# Patient Record
Sex: Male | Born: 1940 | Race: White | Hispanic: No | Marital: Married | Smoking: Former smoker
Health system: Southern US, Community
[De-identification: ages and names within clinical notes are randomized; demographics above are authoritative.]

## PROBLEM LIST (undated history)

## (undated) DIAGNOSIS — C801 Malignant (primary) neoplasm, unspecified: Secondary | ICD-10-CM

## (undated) DIAGNOSIS — R269 Unspecified abnormalities of gait and mobility: Secondary | ICD-10-CM

## (undated) DIAGNOSIS — I10 Essential (primary) hypertension: Secondary | ICD-10-CM

## (undated) DIAGNOSIS — I69321 Dysphasia following cerebral infarction: Secondary | ICD-10-CM

## (undated) DIAGNOSIS — M810 Age-related osteoporosis without current pathological fracture: Secondary | ICD-10-CM

## (undated) DIAGNOSIS — B351 Tinea unguium: Secondary | ICD-10-CM

## (undated) DIAGNOSIS — L570 Actinic keratosis: Secondary | ICD-10-CM

## (undated) DIAGNOSIS — I6529 Occlusion and stenosis of unspecified carotid artery: Secondary | ICD-10-CM

## (undated) DIAGNOSIS — Z87828 Personal history of other (healed) physical injury and trauma: Secondary | ICD-10-CM

## (undated) DIAGNOSIS — I739 Peripheral vascular disease, unspecified: Secondary | ICD-10-CM

## (undated) DIAGNOSIS — G40909 Epilepsy, unspecified, not intractable, without status epilepticus: Secondary | ICD-10-CM

## (undated) DIAGNOSIS — D649 Anemia, unspecified: Secondary | ICD-10-CM

## (undated) HISTORY — DX: Anemia, unspecified: D64.9

## (undated) HISTORY — DX: Essential (primary) hypertension: I10

## (undated) HISTORY — DX: Unspecified abnormalities of gait and mobility: R26.9

## (undated) HISTORY — DX: Malignant (primary) neoplasm, unspecified: C80.1

## (undated) HISTORY — DX: Occlusion and stenosis of unspecified carotid artery: I65.29

## (undated) HISTORY — DX: Personal history of other (healed) physical injury and trauma: Z87.828

## (undated) HISTORY — DX: Actinic keratosis: L57.0

## (undated) HISTORY — DX: Age-related osteoporosis without current pathological fracture: M81.0

## (undated) HISTORY — DX: Peripheral vascular disease, unspecified: I73.9

## (undated) HISTORY — DX: Tinea unguium: B35.1

## (undated) HISTORY — DX: Dysphasia following cerebral infarction: I69.321

## (undated) HISTORY — DX: Epilepsy, unspecified, not intractable, without status epilepticus: G40.909

## (undated) HISTORY — PX: SUBDURAL HEMATOMA EVACUATION VIA CRANIOTOMY: SUR319

---

## 1997-12-25 ENCOUNTER — Emergency Department (HOSPITAL_COMMUNITY): Admission: EM | Admit: 1997-12-25 | Discharge: 1997-12-25 | Payer: Self-pay | Admitting: Emergency Medicine

## 1998-01-02 ENCOUNTER — Emergency Department (HOSPITAL_COMMUNITY): Admission: EM | Admit: 1998-01-02 | Discharge: 1998-01-02 | Payer: Self-pay | Admitting: Emergency Medicine

## 1998-01-05 ENCOUNTER — Ambulatory Visit (HOSPITAL_COMMUNITY): Admission: RE | Admit: 1998-01-05 | Discharge: 1998-01-05 | Payer: Self-pay | Admitting: Neurosurgery

## 1998-01-20 ENCOUNTER — Ambulatory Visit (HOSPITAL_COMMUNITY): Admission: RE | Admit: 1998-01-20 | Discharge: 1998-01-20 | Payer: Self-pay | Admitting: Neurosurgery

## 2002-08-19 ENCOUNTER — Encounter (HOSPITAL_BASED_OUTPATIENT_CLINIC_OR_DEPARTMENT_OTHER): Payer: Self-pay | Admitting: General Surgery

## 2002-08-20 ENCOUNTER — Encounter (INDEPENDENT_AMBULATORY_CARE_PROVIDER_SITE_OTHER): Payer: Self-pay | Admitting: *Deleted

## 2002-08-20 ENCOUNTER — Ambulatory Visit (HOSPITAL_COMMUNITY): Admission: RE | Admit: 2002-08-20 | Discharge: 2002-08-20 | Payer: Self-pay | Admitting: General Surgery

## 2003-05-03 ENCOUNTER — Inpatient Hospital Stay (HOSPITAL_COMMUNITY): Admission: EM | Admit: 2003-05-03 | Discharge: 2003-05-27 | Payer: Self-pay | Admitting: Emergency Medicine

## 2003-05-03 ENCOUNTER — Encounter: Payer: Self-pay | Admitting: Emergency Medicine

## 2003-05-05 ENCOUNTER — Encounter (INDEPENDENT_AMBULATORY_CARE_PROVIDER_SITE_OTHER): Payer: Self-pay | Admitting: *Deleted

## 2003-05-05 ENCOUNTER — Emergency Department (HOSPITAL_COMMUNITY): Admission: EM | Admit: 2003-05-05 | Discharge: 2003-05-05 | Payer: Self-pay | Admitting: Emergency Medicine

## 2003-06-02 ENCOUNTER — Ambulatory Visit (HOSPITAL_COMMUNITY): Admission: RE | Admit: 2003-06-02 | Discharge: 2003-06-02 | Payer: Self-pay | Admitting: Internal Medicine

## 2003-06-04 ENCOUNTER — Ambulatory Visit (HOSPITAL_COMMUNITY): Admission: RE | Admit: 2003-06-04 | Discharge: 2003-06-04 | Payer: Self-pay | Admitting: Internal Medicine

## 2003-06-29 ENCOUNTER — Ambulatory Visit (HOSPITAL_COMMUNITY): Admission: RE | Admit: 2003-06-29 | Discharge: 2003-06-29 | Payer: Self-pay | Admitting: Internal Medicine

## 2003-07-19 ENCOUNTER — Encounter: Admission: RE | Admit: 2003-07-19 | Discharge: 2003-07-19 | Payer: Self-pay | Admitting: Neurosurgery

## 2005-11-16 ENCOUNTER — Encounter: Admission: RE | Admit: 2005-11-16 | Discharge: 2005-11-16 | Payer: Self-pay | Admitting: Internal Medicine

## 2007-11-17 ENCOUNTER — Observation Stay (HOSPITAL_COMMUNITY): Admission: EM | Admit: 2007-11-17 | Discharge: 2007-11-18 | Payer: Self-pay | Admitting: Emergency Medicine

## 2009-10-11 ENCOUNTER — Ambulatory Visit: Payer: Self-pay | Admitting: Vascular Surgery

## 2009-10-13 ENCOUNTER — Inpatient Hospital Stay (HOSPITAL_COMMUNITY): Admission: RE | Admit: 2009-10-13 | Discharge: 2009-10-14 | Payer: Self-pay | Admitting: Vascular Surgery

## 2009-10-13 ENCOUNTER — Ambulatory Visit: Payer: Self-pay | Admitting: Vascular Surgery

## 2009-10-13 ENCOUNTER — Encounter: Payer: Self-pay | Admitting: Vascular Surgery

## 2009-10-13 HISTORY — PX: CAROTID ENDARTERECTOMY: SUR193

## 2009-11-01 ENCOUNTER — Ambulatory Visit: Payer: Self-pay | Admitting: Vascular Surgery

## 2010-03-29 ENCOUNTER — Encounter: Admission: RE | Admit: 2010-03-29 | Discharge: 2010-03-29 | Payer: Self-pay | Admitting: Endocrinology

## 2010-05-26 ENCOUNTER — Ambulatory Visit (HOSPITAL_COMMUNITY): Admission: RE | Admit: 2010-05-26 | Discharge: 2010-05-26 | Payer: Self-pay | Admitting: Gastroenterology

## 2010-06-06 ENCOUNTER — Ambulatory Visit: Payer: Self-pay | Admitting: Vascular Surgery

## 2010-09-27 LAB — BASIC METABOLIC PANEL
CO2: 27 mEq/L (ref 19–32)
Calcium: 8 mg/dL — ABNORMAL LOW (ref 8.4–10.5)
Chloride: 105 mEq/L (ref 96–112)
Glucose, Bld: 94 mg/dL (ref 70–99)
Sodium: 137 mEq/L (ref 135–145)

## 2010-09-27 LAB — URINALYSIS, ROUTINE W REFLEX MICROSCOPIC
Glucose, UA: NEGATIVE mg/dL
Ketones, ur: NEGATIVE mg/dL
Nitrite: NEGATIVE
pH: 7.5 (ref 5.0–8.0)

## 2010-09-27 LAB — CBC
HCT: 40.9 % (ref 39.0–52.0)
Hemoglobin: 12.1 g/dL — ABNORMAL LOW (ref 13.0–17.0)
Hemoglobin: 13.9 g/dL (ref 13.0–17.0)
MCHC: 33.2 g/dL (ref 30.0–36.0)
MCHC: 34 g/dL (ref 30.0–36.0)
MCV: 92.1 fL (ref 78.0–100.0)
MCV: 92.7 fL (ref 78.0–100.0)
RDW: 14 % (ref 11.5–15.5)
RDW: 14.2 % (ref 11.5–15.5)

## 2010-09-27 LAB — COMPREHENSIVE METABOLIC PANEL
BUN: 9 mg/dL (ref 6–23)
Calcium: 9.2 mg/dL (ref 8.4–10.5)
Creatinine, Ser: 0.82 mg/dL (ref 0.4–1.5)
Glucose, Bld: 108 mg/dL — ABNORMAL HIGH (ref 70–99)
Total Protein: 6.8 g/dL (ref 6.0–8.3)

## 2010-09-27 LAB — PROTIME-INR
INR: 1.16 (ref 0.00–1.49)
Prothrombin Time: 14.7 seconds (ref 11.6–15.2)

## 2010-09-27 LAB — CROSSMATCH
ABO/RH(D): A NEG
Antibody Screen: NEGATIVE

## 2010-11-21 NOTE — Assessment & Plan Note (Signed)
OFFICE VISIT   Richard, Mitchell  DOB:  03/31/1941                                       06/06/2010  ZOXWR#:[id number]   The patient is a 70 year old male patient who has previously undergone  left carotid endarterectomy by me 10/13/2009 for severe left internal  carotid stenosis which is asymptomatic.  He had mild disease in the  contralateral right side.  He has done well from a neurologic standpoint  since that time with no history of TIAs, amaurosis fugax, diplopia,  blurred vision or syncope.  He does have a long history of some  dysphagia which has continued to be somewhat of a problem and is being  evaluated at the current time by Dr. Kinnie Scales for this.   CHRONIC MEDICAL PROBLEMS:  1. Hypertension.  2. Seizure disorder.  3. Osteoporosis.  4. Negative for coronary artery disease and diabetes, COPD or stroke.   SOCIAL HISTORY:  He is married, has one child and is retired.  Does not  use tobacco or alcohol.  Has not smoked for 20 years.   FAMILY HISTORY:  Unremarkable.   REVIEW OF SYSTEMS:  Denies any chest pain, dyspnea on exertion, does  have difficulty swallowing as noted.  No hoarseness.  No hemoptysis or  productive cough.  All other systems are negative.  Seizure disorder is  well-controlled at the present time.  All other systems negative.   PHYSICAL EXAMINATION:  Vital signs:  Blood pressure 142/90, heart rate  64, respirations 14.  General:  He is a well-developed, well-nourished  male who is in no apparent distress, alert and oriented x3.  HEENT:  Normal for age.  EOMs intact.  Lungs:  Clear to auscultation.  No  rhonchi or wheezing.  Cardiovascular:  Regular rhythm, no murmurs.  Carotid pulses 3+, no bruits.  Abdomen:  Soft, nontender with no masses.  He has 3+ femoral and popliteal pulses bilaterally.  Both feet are well-  perfused.  Neurological:  Normal.   Today I ordered a carotid duplex exam which I reviewed and interpreted.  He has  no evidence of significant flow reduction on the left side where  the carotid endarterectomy was performed.  The right side has about a  40%-50% stenosis which is unchanged from previous studies.   I reassured him regarding these findings and will check him again in 6  months in the vascular lab unless he develops any symptoms in the  interim.     Quita Skye Hart Rochester, M.D.  Electronically Signed   JDL/MEDQ  D:  06/06/2010  T:  06/06/2010  Job:  [id number]

## 2010-11-21 NOTE — Assessment & Plan Note (Signed)
OFFICE VISIT   SHAMAR, ENGELMANN  DOB:  [birth year]                                       11/01/2009  ZOXWR#:[id number]   The patient returns today 3 weeks post left carotid endarterectomy for  severe left internal carotid stenosis which is asymptomatic.  He has  done very well with no dysphagia or hoarseness and no neurologic  sequelae.  He does not take aspirin on a daily basis.  He has a remote  history of ulcer.   PHYSICAL EXAM:  Today his blood pressure is 147/87, heart rate is 85,  temperature 97.8.  The left neck incision has healed nicely.  He has a  3+ left carotid pulse.  No audible bruit.  Neurologic exam is normal.   He is progressing nicely.  I have encouraged him to increase his  activity as tolerated.  He will return in 6 months for followup carotid  duplex exam and to see me at that time unless he develops any neurologic  symptoms in the interim.  He has very minimal disease in his  contralateral right carotid.     Quita Skye Hart [place], M.D.  Electronically Signed   JDL/MEDQ  D:  11/01/2009  T:  11/02/2009  Job:  [id number]

## 2010-11-21 NOTE — H&P (Signed)
HISTORY AND PHYSICAL EXAMINATION   October 11, 2009   Re:  Richard Mitchell, Richard Mitchell                DOB:  1941/03/25   CHIEF COMPLAINT:  Severe left internal carotid stenosis - asymptomatic.   HISTORY OF PRESENT ILLNESS:  The patient is a 70 year old male patient  referred by Dr. Clelia Croft for severe carotid occlusive disease.  Dr. Clelia Croft was  performing a physical exam and discovered a left carotid bruit and a  subsequent duplex scan revealed 90% left internal carotid stenosis with  mild flow reduction on the right side.  The patient denies any  hemiparesis, aphasia, amaurosis fugax, diplopia, blurred vision or  syncope.  He does have a remote history of seizure disorder.  He was  referred for his severe carotid disease.   CHRONIC MEDICAL PROBLEMS:  1. Hypertension.  2. Osteoporosis.  3. Seizure disorder.  4. Negative for diabetes, coronary artery disease, COPD or stroke.   FAMILY HISTORY:  Positive for pacemaker in his mother, coronary artery  disease in a sister.  Negative for diabetes and stroke.   SOCIAL HISTORY:  He is married, has one child, is retired.  He quit  smoking about 20 years and does not use alcohol.   REVIEW OF SYSTEMS:  Denies any chest pain, dyspnea on exertion, PND,  orthopnea.  No bronchitis, hemoptysis, wheezing.  Denies any GI or GU  symptoms.  No claudication.  No DVT, phlebitis.  Does have a history of  seizure disorder controlled on Tegretol.  Has a remote history of a  bleed in the head from trauma 13 years ago requiring evacuation of what  sounds like subdural hematoma, no current symptoms.  All other systems  are negative in the review of systems.   PHYSICAL EXAMINATION:  Vital signs:  Blood pressure 122/79, heart rate  78, respirations 14.  General:  He is a well-developed, well-nourished  male who is in no apparent distress, alert and oriented x3.  HEENT:  He  has poor oral dentition, otherwise normal.  EOMs intact.  Lungs:  Clear  to  auscultation.  No wheezing.  Cardiovascular:  Regular rate and  rhythm.  No murmurs.  Carotid bruit on the left is heard.  3+ pulses are  present.  Lower extremity pulses are 2+ at the femoral and dorsalis  pedis level.  No edema.  Abdomen: Soft, nontender with no masses.  Musculoskeletal:  Free of major deformities.  Neurologic:  Normal.  Skin:  Free of rashes.   Today I reviewed the clinical notes provided by Dr. Alver Fisher office as  well as the radiology report from [facility] performed on March 29.  I also ordered a carotid duplex exam which I reviewed and interpreted  for comparative purposes.  The patient does have a 90%-95% left internal  carotid stenosis with minimal flow reduction in the right internal  carotid and biphasic flow in both vertebral arteries.   IMPRESSION:  Severe left internal carotid stenosis - asymptomatic.  Plan  is to admit the patient on April 7 for elective left carotid  endarterectomy.  Recent and benefits have been thoroughly discussed with  the patient and he would like to proceed.     Quita Skye Hart [place], M.D.  Electronically Signed   JDL/MEDQ  D:  10/11/2009  T:  10/12/2009  Job:  [id number]   cc:   Kari Baars, M.D.

## 2010-11-21 NOTE — Procedures (Signed)
CAROTID DUPLEX EXAM   INDICATION:  Follow up known carotid disease.   HISTORY:  Diabetes:  No.  Cardiac:  No.  Hypertension:  Yes.  Smoking:  Previous.  Previous Surgery:  Left carotid endarterectomy in 10/13/2009.  CV History:  No.  Amaurosis Fugax No, Paresthesias No, Hemiparesis No.                                       RIGHT             LEFT  Brachial systolic pressure:         142               146  Brachial Doppler waveforms:         Normal            Normal  Vertebral direction of flow:        Antegrade/low flow                  Antegrade  DUPLEX VELOCITIES (cm/sec)  CCA peak systolic                   114               121  ECA peak systolic                   120               138  ICA peak systolic                   150               127  ICA end diastolic                   25                23  PLAQUE MORPHOLOGY:                  Heterogenous      None  PLAQUE AMOUNT:                      Mild-to-moderate  None  PLAQUE LOCATION:                    ICA/ECA           None   IMPRESSION:  1. The right internal carotid artery suggests low-end 40% to 59%      stenosis in the distal internal carotid artery; however, velocities      could be elevated due to vessel tortuosity.  2. Left internal carotid artery is patent, status post carotid      endarterectomy.  3. Bilateral vertebral arteries suggest antegrade flow; however, right      vertebral suggests low-grade flow.  4. Significantly better from previous examination due to left carotid      endarterectomy.        ___________________________________________  Quita Skye Hart [place], M.D.   NT/MEDQ  D:  06/06/2010  T:  06/06/2010  Job:  [id number]

## 2010-11-21 NOTE — Procedures (Signed)
CAROTID DUPLEX EXAM   INDICATION:  Follow up known carotid artery disease.   HISTORY:  Diabetes:  No  Cardiac:  No  Hypertension:  Yes  Smoking:  Quit  Previous Surgery:  No  CV History:  No  Amaurosis Fugax No, Paresthesias No, Hemiparesis No                                       RIGHT             LEFT  Brachial systolic pressure:         140               130  Brachial Doppler waveforms:         biphasic          biphasic  Vertebral direction of flow:        antegrade         antegrade  DUPLEX VELOCITIES (cm/sec)  CCA peak systolic                   122               109  ECA peak systolic                   164               439  ICA peak systolic                   88                414  ICA end diastolic                   28                187  PLAQUE MORPHOLOGY:                  heterogeneous     heterogeneous  PLAQUE AMOUNT:                      mild              severe  PLAQUE LOCATION:                    ICA and ECA       ICA and ECA   IMPRESSION:  1. An 80%-99% stenosis noted in the left internal carotid artery.  2. A 1%-39% stenosis noted in the right internal carotid artery.  3. Antegrade vertebral arteries.   ___________________________________________  Richard Mitchell, M.D.   MG/MEDQ  D:  10/11/2009  T:  10/12/2009  Job:  [id number]

## 2010-11-21 NOTE — H&P (Signed)
[name], Mitchell NO.:  [id number]   MEDICAL RECORD NO.:  [id number]          PATIENT TYPE:  EMS   LOCATION:  ED                           FACILITY:  [facility]   PHYSICIAN:  Kari Baars, M.D.  DATE OF BIRTH:  07-31-1940   DATE OF ADMISSION:  11/17/2007  DATE OF DISCHARGE:                              HISTORY & PHYSICAL   CHIEF COMPLAINT:  Seizure, head laceration.   HISTORY OF PRESENT ILLNESS:  Richard Mitchell is a 70 year old white male with  a history of seizure disorder, alcoholism, who presented to the  emergency department today following a seizure resulting in a severe  scalp laceration with extensive bleeding.  The patient has not been seen  in our office since November 2007, at which time he had been seizure  free for several years.  He had a history of alcohol-related seizures  first occurring in 2004 when he presented with a subdural hematoma  requiring craniotomy x2.  He was treated by Dr. Jeral Fruit in neurosurgery  with Tegretol at that time and had remained seizure free.  We continued  to refill his medications in the absence of seizures, although I do not  see that he is had any recent refills.  He does state that he has  continued to take his Tegretol.  His alcohol use has recently increased  over the past 6 months.  His wife states that she had been restricting  his access to money so that he could not go out and get alcohol by cab.  However, his mother recently died and he came into some money and has is  apparently been increasing his alcohol use.  He reports 2-3 drinks of  vodka per day including his most recent drinks this morning.  Following  this drink, he had loss of consciousness and woke up with a large amount  of bleeding and laceration on his posterior scalp.  This was an  unwitnessed event, but that it was similar to his prior generalized  tonic-clonic seizures.  He has recently had an episode where he felt  like he was going to have a  seizure.  His wife escorted him into the  bedroom, and he did not have any seizure activity at that time.  His  last known seizure was about 3 years ago.  In the emergency department,  he was found to have a large laceration on his posterior scalp which was  stapled by the ER physician.  Head CT and C spine CT scans were negative  for acute findings.  While in the emergency department, he was noted  have a significant drop in his blood pressure down to 76/48 with an  increase of heart rate up to  109.  He has been receiving fluids with  improvement in his vital signs.  He has not had any additional bleeding  for the past 3 hours since his current dressing was placed.   PAST MEDICAL HISTORY:  1. Seizure disorder, presumably secondary to alcohol.  He has not had      a formal neurologic evaluation  in the past other than during his      hospitalization for subdural hematoma.  2. History of subdural hematoma secondary to seizure, status post      craniotomy x2 (October 2004).  3. Hypertension.  4. Osteoporosis.  5. Abdominal aortic aneurysm (2.8 cm).  6. History of positive PPD, never treated.   CURRENT MEDICATIONS:  1. Tegretol 200 mg 2 b.i.d.  2. Lisinopril 20 mg daily.  3. Fosamax 70 mg weekly.   ALLERGIES:  NO KNOWN DRUG ALLERGIES.   SOCIAL HISTORY:  He is married.  He is disabled.  He does not drive.  He  quit smoking in 1987 but has a 20 pack-year history.  He drinks 2-3  vodka drinks per day several days a week.  He states he is able to stop  drinking without any withdrawal symptoms.  His most recent drink was  this morning.   FAMILY HISTORY:  Father had COPD.  His mother had an MI in her 26s.  He  has a sister with an MI.  His daughter is healthy.   REVIEW OF SYSTEMS:  All systems reviewed with the patient and are  negative except as in HPI.   PHYSICAL EXAMINATION:  VITAL SIGNS:  Temperature 98; current blood  pressure 112/51 (ranging from 76-107/48-73), heart rate  currently 81  (ranging from 81-109), respirations 16, oxygen saturation 100% on room  air.  IN GENERAL:  He does smell of alcohol and is poorly kempt with blood  over his scalp, chest, and trunk.  HEENT:  Poor dentition.  Posterior scalp laceration with staples with  extensive bleeding over his scalp.  HEART:  Regular rate and rhythm without murmurs, rubs or gallops.  LUNGS:  Clear to auscultation bilaterally.  ABDOMEN:  Soft, nondistended, nontender with normoactive bowel sounds.  EXTREMITIES:  No clubbing, cyanosis or edema.  NEUROLOGICAL:  Alert and oriented x4.  Nonfocal neurologic exam with  motor strength 5/5 in all extremities and deep tendon reflexes 2+ and  symmetric.  No clonus.   LABORATORIES:  CBC shows a white count of 12.1, hemoglobin 12.3,  platelets 187.  BMET is significant for sodium 137, potassium 2.9,  chloride 105, bicarb 22, BUN 5, creatinine 0.7, glucose 112.  Albumin  3.3.  Liver function tests normal.  Urine drug screen negative.  Alcohol  level 139.  Tegretol level 10.1 which is therapeutic.  INR 1.2.   STUDIES:  1. Head CT shows no acute intracranial abnormalities.  There is      atrophy.  2. CT of the spine shows no fracture or subluxation.  3. EKG shows normal sinus rhythm with a prolonged QTc of 489.   ASSESSMENT/PLAN:  1. Seizure disorder with extensive scalp laceration with large volume      blood loss--this is likely an alcohol-related seizure activity.      Less likely related to his prolonged QT.  Will admit for      stabilization of his blood pressure and heart rate with IV fluids      and blood products as needed for acute blood loss due to his      laceration.  His Tegretol is therapeutic, and we presume that his      seizure was related to alcohol use.  Will arrange for close      outpatient followup of seizure disorder with a new neurologist to      guide additional therapy.  He has not been driving, this since will  not be an issue.   2. Hypotension secondary to blood loss/hypovolemia--serial CBCs,      hydration with normal saline 150 mL an hour.  Will consider      transfusion if his hemoglobin drops point below 9 or if he has a      persistent hypotension.  This is currently stable.  Will hold his      lisinopril.  3. Alcoholism--will place on Ativan for delirium tremens prophylaxis.      I suspect his alcohol use actually exceeds      his reported consumption and have counseled him on the importance      of alcohol cessation in order to prevent seizures.   DISPOSITION:  Anticipate discharge in the morning if he is stable with  close outpatient followup.  This      Kari Baars, M.D.  Electronically Signed     WS/MEDQ  D:  11/17/2007  T:  11/17/2007  Job:  [id number]

## 2010-11-24 NOTE — Op Note (Signed)
NAME:  Richard Mitchell, Richard Mitchell                          ACCOUNT NO.:  [id number]   MEDICAL RECORD NO.:  [id number]                   PATIENT TYPE:  AMB   LOCATION:  ENDO                                 FACILITY:  [facility]   PHYSICIAN:  Lina Sar, M.D. [facility]               DATE OF BIRTH:  [birth year]   DATE OF PROCEDURE:  06/29/2003  DATE OF DISCHARGE:                                 OPERATIVE REPORT   NAME OF PROCEDURE:  PEG removal.   INDICATIONS:  This 70 year old white male underwent placement of  percutaneous gastrostomy on May 20, 2003, after he suffered subdural  hematoma and speech pathology evaluation revealed high risk for aspiration.  He was dependent on PEG tube feedings until two weeks ago when he passed his  swallowing study and was able to start oral feedings.  He is now undergoing  removal of the gastrostomy after he has completely transferred to the oral  feedings.   SEDATION:  None.   FINDINGS:  The gastrostomy was located in the left upper quadrant and this  was a 57 [place] _________ gastrostomy.  The skin around the PEG site  appeared to be erythematous with some drainage.  PEG was removed by gentle  traction.  The T-mushroom bumper collapsed and we were able to remove the  PEG.  The residual defect in the abdominal wall was washed, cleaned, and  dressed.  The patient tolerated the procedure well.   IMPRESSION:  Removal of 24 [place] __________ gastrostomy.   PLAN:  The patient was advised to keep the dressing on for one to two days,  wash it, keep it clean, and he may resume his diet today.                                               Lina Sar, M.D. [facility]    DB/MEDQ  D:  06/29/2003  T:  06/29/2003  Job:  [id number]   cc:   Hilda Lias, M.D.  [address]  [place], [place] [address]  Fax: [phone]

## 2010-11-24 NOTE — H&P (Signed)
NAME:  Richard Mitchell, Richard Mitchell NO.:  [id number]   MEDICAL RECORD NO.:  [id number]                   PATIENT TYPE:  INP   LOCATION:                                       FACILITY:  [facility]   PHYSICIAN:  Lina Sar, M.D. [facility]               DATE OF BIRTH:  Nov 28, 1940   DATE OF ADMISSION:  05/03/2003  DATE OF DISCHARGE:                                HISTORY & PHYSICAL   PROCEDURE:  Percutaneous endoscopic gastrostomy.   SURGEON:  Lina Sar, M.D.   INDICATIONS FOR PROCEDURE:  This 70 year old gentleman is two weeks post  subdural hematoma.  Speech pathology evaluation revealed high risk for  aspiration.  He has had panda tube placed but tube has been displaced by the  patient on multiple occasions due to his confusion.  His prealbumin has been  low at 11.6.  His plans for discharge were to go to rehabilitation.  He was  found to be an appropriate candidate for percutaneous gastrostomy.  This was  discussed with this wife.   ENDOSCOPE:  Fujinon single channel video endoscope and Bard gastrostomy  tray.   SEDATION:  Versed 6 mg IV, Fentanyl 60 mcg IV.   FINDINGS:  The gastroscopy site was located in the left upper quadrant by  transillumination of the abdominal wall with endoscope which passed under  direct vision through the esophagus and into the stomach.  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  There was a small hiatal hernia distal to the stricture.  The endoscope  traversed through the stricture without difficulty.  Gastric mucosa was  normal including pyloris and duodenum.  After transillumination in  the  abdominal wall, the skin was infiltrated with 1% Xylocaine with epinephrine.  A small incision was made in the abdominal wall and Seldinger needle passed  into the gastric lumen under direct vision.  Through the Seldinger needle, a  guidewire was placed and grabbed with the snare and pulled out  through the  esophagus outside of the mouth.  The gastrostomy tube was then placed over  the guidewire with a tapered end first and advanced through the esophagus  and through the stomach.  The mushroom T-bumper of the gastrostomy tube was  snug against the abdominal wall.  Retention disc and adaptors were placed.  It was noted on the endoscopy that there was a tear in the esophageal  stricture which had mild to moderate amount of bleeding secondary to trauma  induced by the gastrostomy tubing.  The patient otherwise tolerated the  procedure well.   IMPRESSION:  1. Placement of 24 French Bard gastrostomy.  2. Mild esophageal stricture, status post tear caused by gastrostomy T-     bumper.    PLAN:  1. Bowel rest.  2. Routine orders have been used for skin care as well as starting  tube     feedings tomorrow morning.  The patient will stay on anti reflux measures     and on a proton pump inhibitor.                                                Lina Sar, M.D. [facility]    DB/MEDQ  D:  05/20/2003  T:  05/20/2003  Job:  [id number]   cc:   Hilda Lias, M.D.  [address]  [place], [place] [address]  Fax: [phone]

## 2010-11-24 NOTE — H&P (Signed)
NAME:  Richard Mitchell, Richard Mitchell                          ACCOUNT NO.:  [id number]   MEDICAL RECORD NO.:  [id number]                   PATIENT TYPE:  EMS   LOCATION:  MAJO                                 FACILITY:  [facility]   PHYSICIAN:  Stefani Dama, M.D.               DATE OF BIRTH:  06/20/1941   DATE OF ADMISSION:  05/03/2003  DATE OF DISCHARGE:                                HISTORY & PHYSICAL   ADMITTING DIAGNOSES:  1. Subdural hematoma.  2. Seizure disorder.  3. Alcohol abuse.   HISTORY OF PRESENT ILLNESS:  Richard Mitchell is a 70 year old right-handed  individual who has had a previous surgery for subdural hematoma featuring a  craniotomy in the right frontal parietal region of his skull.  The patient  apparently has had a seizure disorder and has been managed on Dilantin and  Tegretol.  He apparently has not been taking his medications.  He admits to  drinking some alcohol yesterday and the patient apparently had a fall  striking the left frontal region of his scalp.  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  There is no significant shift or mass effect or significant  atrophy in the brain tissues.  The patient notes at the current time he is  feeling well.  He had been feeling sick on his stomach earlier.  His  Dilantin and Tegretol levels are both subtherapeutic though medication is  detected and he thinks that he may have missed a couple of doses of  medicine.  He admits to drinking alcohol yesterday earlier in the day but  has had none recently.  An alcohol level has been drawn; however, the result  is not available at present.   PAST MEDICAL HISTORY:  His other past medical history reveals that he had a  hernia repair in February 2004 by Dr. Lurene Shadow.  Other details of his past  medical history are not immediately available as the old chart is  not  available and the patient is not a very through historian.   SOCIAL HISTORY:  Reveals that the patient lives alone.   PHYSICAL EXAMINATION:  NEUROLOGIC:  Reveals that he is arousable, alert and  oriented.  There is a large ecchymosis above the left eye with a hematoma  and a laceration in the line of the eyebrow.  His face is symmetric.  Tongue  and uvula are in the midline.  Sclerae and conjunctivae are clear.  Dentition is extremely poor. Neck is supple.  No masses are palpable and the  patient has good motion of all his extremities including deltoids, biceps,  triceps, grips, and intrinsics and the patient had soiled himself.  His  pupils are 4 mm, briskly reactive to light and accommodation.  The  extraocular movements  are full.  Left eye is nearly swollen shut because of  the ecchymosis and hematoma in the left frontal region.  GENERAL:  Reveals that he is an arousable, asthenic individual in no overt  distress.  ABDOMEN:  Soft.  Bowel sounds are positive.  No masses are noted.   IMPRESSION:  The patient has a evidence of injury with a small subdural  hematoma in the right frontal region.  This should not be of any  consequence.  However, he will need control of his seizures with further  dosing of his  seizure medications.  He will be observed in the hospital with a repeat CT  scan in the next 24 hours.  Because it is unclear the nature of his alcohol  abuse, he will be started on thiamine and multivitamins also.  He will be  observed for the possibility of DTs.                                                Stefani Dama, M.D.    [name]  D:  05/03/2003  T:  05/03/2003  Job:  [id number]

## 2010-11-24 NOTE — Consult Note (Signed)
   NAME:  Richard Mitchell, Richard Mitchell NO.:  [id number]   MEDICAL RECORD NO.:  [id number]                   PATIENT TYPE:  EMS   LOCATION:  MAJO                                 FACILITY:  [facility]   PHYSICIAN:  Stefani Dama, M.D.               DATE OF BIRTH:  23-Aug-1940   DATE OF CONSULTATION:  05/03/2003  DATE OF DISCHARGE:                                   CONSULTATION   REQUESTING PHYSICIAN:  Carleene Cooper, M.D.   REASON FOR REQUEST:  Subgaleal hematoma, seizure disorder.   HISTORY OF PRESENT ILLNESS:  The patient is a 70 year old individual who was  found down at home, brought to the [facility] Emergency Room with a  large left frontal subgaleal hematoma and periorbital ecchymosis.  He had a  CT scan that demonstrated presence of an old intracranial injury having had  a craniotomy on the right side and a subdural hematoma on that level without  significant shift or mass effect.  Consultation is now being sought for  further management of this process.  The patient will ultimately need to be  admitted for observation, reloading of seizure medications and he is being  admitted to my service.  This is being dictated under a separate heading.  For details of the physical examination and other details of the history,  please note the history and physical.                                               Stefani Dama, M.D.    [name]  D:  05/03/2003  T:  05/03/2003  Job:  [id number]

## 2010-11-24 NOTE — Op Note (Signed)
[name], Richard Mitchell                          ACCOUNT NO.:  [id number]   MEDICAL RECORD NO.:  [id number]                   PATIENT TYPE:  OIB   LOCATION:  2887                                 FACILITY:  [facility]   PHYSICIAN:  Leonie Man, M.D.                DATE OF BIRTH:  12/25/1940   DATE OF PROCEDURE:  08/20/2002  DATE OF DISCHARGE:  08/20/2002                                 OPERATIVE REPORT   PREOPERATIVE DIAGNOSIS:  Right inguinal hernia.   POSTOPERATIVE DIAGNOSIS:  Right inguinal hernia.   PROCEDURE:  Repair of right inguinal hernia with mesh.   SURGEON:  Leonie Man, M.D.   ASSISTANT:  Nurse.   ANESTHESIA:  General.   CLINICAL NOTE:  The patient is a 70 year old man presenting with a right-  sided groin bulge, on evaluation noted to be a right-sided inguinal hernia.  He is brought to the operating room after the risks and potential benefits  of hernia surgery have been fully discussed, all questions answered, and  consent obtained.   DESCRIPTION OF PROCEDURE:  Following the induction of satisfactory general  anesthesia, the patient was positioned supinely.  The lower abdomen was  prepped and draped routinely, and the right-sided lower abdominal crease was  infiltrated with 0.75% Marcaine with epinephrine.  Additional injections of  Marcaine with epinephrine were used throughout the course of the operation  to maintain analgesia.  A transverse incision in the lower abdominal crease  on the right side is carried down through the skin and subcutaneous tissues  down to the external oblique aponeurosis.  This is opened up through the  external inguinal ring with protection of the ilioinguinal nerve.  The  spermatic cord is elevated and with a Penrose drain, dissection along the  spermatic cord reveals an indirect sac which is dissected free from the cord  contents and carried out to the internal ring.  The sac is opened.  There is  no intra-abdominal contents  within, and there is no sliding hernia noted.  The sac is then suture ligated at its base with 2-0 silk sutures and the  redundant sac is amputated and forwarded for pathologic evaluation.  The  indirect space is then repaired with a polypropylene mesh onlay sewn in at  the pubic tubercle and carried up along Poupart's ligament to the internal  ring and again from the pubic tubercle up along the conjoined tendon to the  internal ring.  The mesh is split and the cord is allowed to protrude  through the mesh.  The tails of the mesh were then trimmed and sutured into  the internal oblique muscle behind the cord.  All the areas of dissection  are then checked for hemostasis, sponge, instrument, and sharp counts  verified, the spermatic cord returned to its normal position, and the  external oblique aponeurosis closed over the cord with a running suture of 2-  0 Vicryl.  Scarpa's fascia and the subcutaneous tissue are closed with a  running 3-0 Vicryl suture and the skin was reapproximated with a running 4-0  Monocryl suture, then reinforced with Steri-Strips.  A sterile dressing was  applied, the anesthetic reversed, and the patient removed from the operating  room to the recovery room in stable condition.  He tolerated the procedure  well.                                               Leonie Man, M.D.   PB/MEDQ  D:  11/10/2002  T:  11/10/2002  Job:  [id number]

## 2010-11-24 NOTE — Discharge Summary (Signed)
Richard Mitchell, Richard Mitchell                          ACCOUNT NO.:  [id number]   MEDICAL RECORD NO.:  [id number]                   PATIENT TYPE:  INP   LOCATION:  3018                                 FACILITY:  [facility]   PHYSICIAN:  Hilda Lias, M.D.                DATE OF BIRTH:  [birth year]   DATE OF ADMISSION:  05/03/2003  DATE OF DISCHARGE:  05/27/2003                                 DISCHARGE SUMMARY   ADMISSION DIAGNOSES:  1. Subdural hematoma.  2. Chronic alcoholism.   POSTOPERATIVE DIAGNOSES:  1. Subdural hematoma.  2. Chronic alcoholism.   MEDICAL HISTORY:  Mr. Makar was a patient who about many years ago  underwent a craniotomy for a subdural hematoma.  This time, he was admitted  by Dr. Stefani Dama.  The patient has a subdural hematoma and he was  advised not to have surgery.  Nevertheless, the patient became more confused  and repeat CT scan showed increase of the subdural; because of that, the  patient was admitted immediately to the hospital.  The patient has a history  of chronic alcoholism and he has a history also of seizures, taking  medication.  According to his wife, lately he had been not taking the  medication and as he ended up probably having a seizure, he fell and hit his  head.   LABORATORY DATA:  Laboratory at the present time is normal.   COURSE IN THE HOSPITAL:  The patient was taken to surgery and a craniotomy  with evacuation of the subdural hematoma was done.  Nevertheless, after we  closed, we found out that the drain was draining more than we expected and I  decided to leave the bone flap out.  The patient was kept in the intensive  care unit and later on transferred him to 3000.  The patient became weak and  more confused.  We got hold of __________ CCU to help with delirium tremens.  Nevertheless, the patient improved.  The patient was unable to keep his  Panda and a decision to put a PEG was done.  Today, the patient is more  stable.   Rehabilitation therapy feels that he is not a candidate and he is  being discharged to a nursing home.   CONDITION AT DISCHARGE:  Improving.   MEDICATIONS:  He will continue taking the Tegretol and Dilantin through PEG.   DIET:  He will continue his diet through the PEG tube.   ACTIVITY:  Activity will be up to the nursing home.  They need to be careful  as there is no bone flap to prevent any trauma to the head.   FOLLOWUP:  I will see him in my office in three months or before.  He is  going to require eventually a bone flap and I would like to do it six months  from now.  Hilda Lias, M.D.    EB/MEDQ  D:  05/27/2003  T:  05/27/2003  Job:  [id number]

## 2010-11-24 NOTE — Op Note (Signed)
NAME:  HURMAN, KETELSEN                          ACCOUNT NO.:  [id number]   MEDICAL RECORD NO.:  [id number]                   PATIENT TYPE:  EMS   LOCATION:  MAJO                                 FACILITY:  [facility]   PHYSICIAN:  Hilda Lias, M.D.                DATE OF BIRTH:  02/11/1941   DATE OF PROCEDURE:  05/05/2003  DATE OF DISCHARGE:                                 OPERATIVE REPORT   PREOPERATIVE DIAGNOSIS:  Acute subdural hematoma right frontal temporal  parietal.   POSTOPERATIVE DIAGNOSIS:  Acute subdural hematoma right frontal temporal  parietal.   PROCEDURE:  Right frontal temporal craniotomy, removal of the old bone flap,  evacuation of the subdural hematoma, removal of the bone flap.   SURGEON:  Hilda Lias, M.D.   CLINICAL HISTORY:  Mr. Waln is a gentleman who was admitted on October 25  by Dr. Johnnette Gourd with a diagnosis of subdural hematoma, seizure disorder,  and alcohol abuse.  This gentleman, many years ago, underwent right frontal  temporal craniotomy by Dr. Danielle Dess.  According to Dr. Danielle Dess, his wife told  him that the patient has not been taking his medications for seizure and he  had been drinking alcohol.  He fell and he was brought to the hospital and  found by x-ray to have a subdural hematoma on the right side.  This morning,  the patient was found on the floor, he has a laceration on his chin.  We did  a CT scan of the head which showed that, indeed, the subdural hematoma has  increased in size.  Clinically, he is obtunded although he was able to move  all four extremities with slight weakness of the left side.  I talked to Dr.  Barnett Abu and he was on his way out for a meeting out of the city.  He  arrived and talked to his wife.  The wife that Dr. Danielle Dess would be out of  town and I would be taking over the care.  The surgery was explained to her  including the possibility of repeating the surgery because although the CBC  and PTT are normal,  this problem with the current intake of Tegretol,  Dilantin, and alcohol, the patient has a tendency to rebleed.   PROCEDURE:  The patient was taken to the OR and after intubation, the head  was prepped with Betadine.  Drapes were applied.  An incision following the  previous one was made.  Rainey clips were applied to the edges.  We removed  the previous bone flap.  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  Using copious amounts of  irrigation and suction, total gross resection  of the hematoma was  accomplished.  At the end, the brain was pulsatile although in the right  frontal temporal area, there was an area of contusion with some clot on top  which we decided not to touch at all which probably was the source of this  new bleeding.  Having done this, we used Duragen and put the bone flap in  place.  We left a Jackson-Pratt  in place and closed the scalp.  I kept the  patient in the operating room just to see, but then I found at that the  patient was draining more blood than expected.  Because of that, I decided  to open the patient again and remove the bone flap.  The brain, itself, was  clean but most of the blood was coming from the edges of the scalp.  Because  of that, we coagulated the scalp along the incision.  The dura matter was  left open using the previous Duragen.  We used a Penrose and we removed the  Jackson-Pratt drain.  We decided not to use the bone flap and  then the wound was closed with 0 Vicryl and 2-0 nylon.  The patient will be  going to the intensive care unit.  Tomorrow, I will be talking to one of the  hematologist to take a look and see how they can help.  During surgery, we  used 6 units of fresh frozen plasma plus 2 units of packed cells.                                                Hilda Lias, M.D.    EB/MEDQ  D:  05/05/2003  T:  05/05/2003  Job:  [id number]

## 2010-12-05 ENCOUNTER — Other Ambulatory Visit: Payer: Self-pay

## 2010-12-20 ENCOUNTER — Other Ambulatory Visit (INDEPENDENT_AMBULATORY_CARE_PROVIDER_SITE_OTHER): Payer: Medicare Other

## 2010-12-20 DIAGNOSIS — I6529 Occlusion and stenosis of unspecified carotid artery: Secondary | ICD-10-CM

## 2010-12-20 DIAGNOSIS — Z48812 Encounter for surgical aftercare following surgery on the circulatory system: Secondary | ICD-10-CM

## 2010-12-28 NOTE — Procedures (Unsigned)
CAROTID DUPLEX EXAM  INDICATION:  Followup carotid artery disease  HISTORY: Diabetes:  No Cardiac:  No Hypertension:  Yes Smoking:  Previous Previous Surgery:  Left carotid endarterectomy 10/13/2009 CV History:  The patient is asymptomatic Amaurosis Fugax No, Paresthesias No, Hemiparesis No                                      RIGHT             LEFT Brachial systolic pressure:         142               148 Brachial Doppler waveforms:         WNL               WNL Vertebral direction of flow:        Antegrade         Antegrade DUPLEX VELOCITIES (cm/sec) CCA peak systolic                   115               104 ECA peak systolic                   131               201 ICA peak systolic                   120               106 ICA end diastolic                   17                31 PLAQUE MORPHOLOGY:                  Heterogeneous PLAQUE AMOUNT:                      Mild to moderate PLAQUE LOCATION:                    ICA and ECA  IMPRESSION: 1. Right-sided 40% to 59% stenosis; this could be secondary to vessel     tortuosity. 2. Patent left carotid endarterectomy with no hemodynamically     significant stenosis. 3. Left external carotid artery stenosis. 4. Study stable compared to previous.  ___________________________________________ Quita Skye Hart [place], M.D.  OD/MEDQ  D:  12/20/2010  T:  12/20/2010  Job:  [id number]

## 2011-10-01 DIAGNOSIS — E559 Vitamin D deficiency, unspecified: Secondary | ICD-10-CM | POA: Diagnosis not present

## 2011-10-01 DIAGNOSIS — Z125 Encounter for screening for malignant neoplasm of prostate: Secondary | ICD-10-CM | POA: Diagnosis not present

## 2011-10-01 DIAGNOSIS — I6529 Occlusion and stenosis of unspecified carotid artery: Secondary | ICD-10-CM | POA: Diagnosis not present

## 2011-10-01 DIAGNOSIS — I10 Essential (primary) hypertension: Secondary | ICD-10-CM | POA: Diagnosis not present

## 2011-10-01 DIAGNOSIS — M810 Age-related osteoporosis without current pathological fracture: Secondary | ICD-10-CM | POA: Diagnosis not present

## 2011-10-01 DIAGNOSIS — E7800 Pure hypercholesterolemia, unspecified: Secondary | ICD-10-CM | POA: Diagnosis not present

## 2011-10-01 DIAGNOSIS — R569 Unspecified convulsions: Secondary | ICD-10-CM | POA: Diagnosis not present

## 2011-10-01 DIAGNOSIS — Z0000 Encounter for general adult medical examination without abnormal findings: Secondary | ICD-10-CM | POA: Diagnosis not present

## 2012-01-16 DIAGNOSIS — M899 Disorder of bone, unspecified: Secondary | ICD-10-CM | POA: Diagnosis not present

## 2012-04-15 DIAGNOSIS — Z23 Encounter for immunization: Secondary | ICD-10-CM | POA: Diagnosis not present

## 2012-07-04 ENCOUNTER — Encounter: Payer: Self-pay | Admitting: Vascular Surgery

## 2012-09-25 DIAGNOSIS — H31019 Macula scars of posterior pole (postinflammatory) (post-traumatic), unspecified eye: Secondary | ICD-10-CM | POA: Diagnosis not present

## 2012-09-25 DIAGNOSIS — H3320 Serous retinal detachment, unspecified eye: Secondary | ICD-10-CM | POA: Diagnosis not present

## 2012-09-25 DIAGNOSIS — Z961 Presence of intraocular lens: Secondary | ICD-10-CM | POA: Diagnosis not present

## 2012-09-26 DIAGNOSIS — E559 Vitamin D deficiency, unspecified: Secondary | ICD-10-CM | POA: Diagnosis not present

## 2012-09-26 DIAGNOSIS — M810 Age-related osteoporosis without current pathological fracture: Secondary | ICD-10-CM | POA: Diagnosis not present

## 2012-09-26 DIAGNOSIS — I10 Essential (primary) hypertension: Secondary | ICD-10-CM | POA: Diagnosis not present

## 2012-09-26 DIAGNOSIS — Z125 Encounter for screening for malignant neoplasm of prostate: Secondary | ICD-10-CM | POA: Diagnosis not present

## 2012-09-26 DIAGNOSIS — E7800 Pure hypercholesterolemia, unspecified: Secondary | ICD-10-CM | POA: Diagnosis not present

## 2012-10-03 DIAGNOSIS — R569 Unspecified convulsions: Secondary | ICD-10-CM | POA: Diagnosis not present

## 2012-10-03 DIAGNOSIS — R82998 Other abnormal findings in urine: Secondary | ICD-10-CM | POA: Diagnosis not present

## 2012-10-03 DIAGNOSIS — E7800 Pure hypercholesterolemia, unspecified: Secondary | ICD-10-CM | POA: Diagnosis not present

## 2012-10-03 DIAGNOSIS — M810 Age-related osteoporosis without current pathological fracture: Secondary | ICD-10-CM | POA: Diagnosis not present

## 2012-10-03 DIAGNOSIS — R5383 Other fatigue: Secondary | ICD-10-CM | POA: Diagnosis not present

## 2012-10-03 DIAGNOSIS — I6529 Occlusion and stenosis of unspecified carotid artery: Secondary | ICD-10-CM | POA: Diagnosis not present

## 2012-10-03 DIAGNOSIS — Z1331 Encounter for screening for depression: Secondary | ICD-10-CM | POA: Diagnosis not present

## 2012-10-03 DIAGNOSIS — I10 Essential (primary) hypertension: Secondary | ICD-10-CM | POA: Diagnosis not present

## 2012-10-03 DIAGNOSIS — F1021 Alcohol dependence, in remission: Secondary | ICD-10-CM | POA: Diagnosis not present

## 2012-10-03 DIAGNOSIS — Z125 Encounter for screening for malignant neoplasm of prostate: Secondary | ICD-10-CM | POA: Diagnosis not present

## 2012-10-03 DIAGNOSIS — E559 Vitamin D deficiency, unspecified: Secondary | ICD-10-CM | POA: Diagnosis not present

## 2012-10-03 DIAGNOSIS — Z23 Encounter for immunization: Secondary | ICD-10-CM | POA: Diagnosis not present

## 2012-10-03 DIAGNOSIS — R5381 Other malaise: Secondary | ICD-10-CM | POA: Diagnosis not present

## 2012-10-03 DIAGNOSIS — Z0000 Encounter for general adult medical examination without abnormal findings: Secondary | ICD-10-CM | POA: Diagnosis not present

## 2012-11-12 DIAGNOSIS — L570 Actinic keratosis: Secondary | ICD-10-CM

## 2012-11-12 DIAGNOSIS — B351 Tinea unguium: Secondary | ICD-10-CM | POA: Diagnosis not present

## 2012-11-12 DIAGNOSIS — L84 Corns and callosities: Secondary | ICD-10-CM | POA: Diagnosis not present

## 2012-11-12 DIAGNOSIS — R269 Unspecified abnormalities of gait and mobility: Secondary | ICD-10-CM | POA: Insufficient documentation

## 2012-11-12 DIAGNOSIS — I739 Peripheral vascular disease, unspecified: Secondary | ICD-10-CM

## 2012-11-12 HISTORY — DX: Peripheral vascular disease, unspecified: I73.9

## 2012-11-12 HISTORY — DX: Tinea unguium: B35.1

## 2012-11-12 HISTORY — DX: Unspecified abnormalities of gait and mobility: R26.9

## 2012-11-12 HISTORY — DX: Actinic keratosis: L57.0

## 2012-11-18 ENCOUNTER — Encounter: Payer: Self-pay | Admitting: Vascular Surgery

## 2012-12-16 ENCOUNTER — Ambulatory Visit: Payer: Medicare Other | Admitting: Vascular Surgery

## 2012-12-16 ENCOUNTER — Other Ambulatory Visit: Payer: Medicare Other

## 2012-12-25 DIAGNOSIS — D129 Benign neoplasm of anus and anal canal: Secondary | ICD-10-CM | POA: Diagnosis not present

## 2012-12-25 DIAGNOSIS — Z1211 Encounter for screening for malignant neoplasm of colon: Secondary | ICD-10-CM | POA: Diagnosis not present

## 2012-12-25 DIAGNOSIS — K621 Rectal polyp: Secondary | ICD-10-CM | POA: Diagnosis not present

## 2012-12-25 DIAGNOSIS — K5730 Diverticulosis of large intestine without perforation or abscess without bleeding: Secondary | ICD-10-CM | POA: Diagnosis not present

## 2012-12-25 DIAGNOSIS — D128 Benign neoplasm of rectum: Secondary | ICD-10-CM | POA: Diagnosis not present

## 2012-12-25 DIAGNOSIS — K620 Anal polyp: Secondary | ICD-10-CM | POA: Diagnosis not present

## 2012-12-25 DIAGNOSIS — D126 Benign neoplasm of colon, unspecified: Secondary | ICD-10-CM | POA: Diagnosis not present

## 2012-12-25 DIAGNOSIS — K648 Other hemorrhoids: Secondary | ICD-10-CM | POA: Diagnosis not present

## 2013-01-29 ENCOUNTER — Other Ambulatory Visit: Payer: Self-pay | Admitting: *Deleted

## 2013-01-29 DIAGNOSIS — Z48812 Encounter for surgical aftercare following surgery on the circulatory system: Secondary | ICD-10-CM

## 2013-02-18 DIAGNOSIS — K648 Other hemorrhoids: Secondary | ICD-10-CM | POA: Diagnosis not present

## 2013-02-24 ENCOUNTER — Other Ambulatory Visit (INDEPENDENT_AMBULATORY_CARE_PROVIDER_SITE_OTHER): Payer: Medicare Other | Admitting: *Deleted

## 2013-02-24 ENCOUNTER — Ambulatory Visit (INDEPENDENT_AMBULATORY_CARE_PROVIDER_SITE_OTHER): Payer: Medicare Other | Admitting: Vascular Surgery

## 2013-02-24 ENCOUNTER — Encounter: Payer: Self-pay | Admitting: Vascular Surgery

## 2013-02-24 DIAGNOSIS — Z48812 Encounter for surgical aftercare following surgery on the circulatory system: Secondary | ICD-10-CM | POA: Diagnosis not present

## 2013-02-24 DIAGNOSIS — I6529 Occlusion and stenosis of unspecified carotid artery: Secondary | ICD-10-CM | POA: Diagnosis not present

## 2013-02-24 NOTE — Progress Notes (Signed)
Subjective:     Patient ID: Richard Mitchell, male   DOB: [birth year], 72 y.o.   MRN: [id number]  HPI this 72 year old male had left carotid and arterectomy performed by me in 2011 for severe asymptomatic left ICA stenosis. He said no new neurologic symptoms since that time including lateralizing weakness, aphasia, amaurosis fugax, diplopia, blurred vision, syncope. He does have a history of seizures and has a remote history of a right subdural hematoma 1998 following a fall. He walks with a cane.  Past Medical History  Diagnosis Date  . Hypertension   . Dysphasia S/P CVA (cerebrovascular accident)   . Carotid artery occlusion   . Seizure disorder   . Osteoporosis   . History of subdural hematoma (post traumatic) Approx 1998    History  Substance Use Topics  . Smoking status: Former Smoker    Quit date: 11/19/1987  . Smokeless tobacco: Not on file  . Alcohol Use: No    Family History  Problem Relation Age of Onset  . Heart disease Mother     had pacemaker  . Heart disease Sister     No Known Allergies  Current outpatient prescriptions:aspirin 81 MG tablet, Take 81 mg by mouth daily., Disp: , Rfl: ;  carbamazepine (TEGRETOL) 200 MG tablet, Take 200 mg by mouth 2 (two) times daily., Disp: , Rfl: ;  losartan (COZAAR) 50 MG tablet, Take 50 mg by mouth daily., Disp: , Rfl: ;  simvastatin (ZOCOR) 40 MG tablet, Take 40 mg by mouth every evening., Disp: , Rfl:  Vitamin D, Ergocalciferol, (DRISDOL) 50000 UNITS CAPS, Take 50,000 Units by mouth every 7 (seven) days., Disp: , Rfl:   BP 151/80  Pulse 65  Temp(Src) 97.8 F (36.6 C) (Oral)  Ht 6\' 2"  (1.88 m)  Wt 179 lb (81.194 kg)  BMI 22.97 kg/m2  SpO2 99%  Body mass index is 22.97 kg/(m^2).           Review of Systems denies chest pain, dyspnea on exertion, PND, orthopnea. Walks with a cane. History of seizure disorders.     Objective:   Physical Exam BP 151/80  Pulse 65  Temp(Src) 97.8 F (36.6 C) (Oral)  Ht 6\' 2"  (1.88  m)  Wt 179 lb (81.194 kg)  BMI 22.97 kg/m2  SpO2 99%  Gen.-alert and oriented x3 in no apparent distress HEENT normal for age--evidence of right temporal craniotomy Lungs no rhonchi or wheezing Cardiovascular regular rhythm no murmurs carotid pulses 3+ palpable no bruits audible Abdomen soft nontender no palpable masses Musculoskeletal free of  major deformities Skin clear -no rashes Neurologic normal Lower extremities 3+ femoral and dorsalis pedis pulses palpable bilaterally with no edema  Data ordered a carotid duplex exam which I reviewed and interpreted. Left carotid endarterectomy site is widely patent no evidence of restenosis. Right internal carotid has an approximate 40% stenosis.       Assessment:     Doing well 3 years post left carotid endarterectomy for severe asymptomatic stenosis with mild right ICA stenosis which is asymptomatic    Plan:     Return in one year followup carotid duplex exam

## 2013-02-25 NOTE — Addendum Note (Signed)
Addended by: Sharee Pimple on: 02/25/2013 09:27 AM   Modules accepted: Orders

## 2013-03-02 DIAGNOSIS — M79609 Pain in unspecified limb: Secondary | ICD-10-CM | POA: Diagnosis not present

## 2013-03-02 DIAGNOSIS — B351 Tinea unguium: Secondary | ICD-10-CM | POA: Diagnosis not present

## 2013-03-18 ENCOUNTER — Encounter: Payer: Self-pay | Admitting: *Deleted

## 2013-03-23 ENCOUNTER — Encounter: Payer: Self-pay | Admitting: Podiatry

## 2013-03-23 DIAGNOSIS — I739 Peripheral vascular disease, unspecified: Secondary | ICD-10-CM | POA: Insufficient documentation

## 2013-03-23 DIAGNOSIS — B351 Tinea unguium: Secondary | ICD-10-CM | POA: Insufficient documentation

## 2013-03-23 DIAGNOSIS — L570 Actinic keratosis: Secondary | ICD-10-CM | POA: Insufficient documentation

## 2013-04-07 DIAGNOSIS — Z23 Encounter for immunization: Secondary | ICD-10-CM | POA: Diagnosis not present

## 2013-06-01 ENCOUNTER — Ambulatory Visit: Payer: Self-pay | Admitting: Podiatry

## 2013-06-22 ENCOUNTER — Ambulatory Visit: Payer: Self-pay | Admitting: Podiatry

## 2013-08-27 ENCOUNTER — Other Ambulatory Visit: Payer: Self-pay | Admitting: Vascular Surgery

## 2013-08-27 DIAGNOSIS — Z48812 Encounter for surgical aftercare following surgery on the circulatory system: Secondary | ICD-10-CM

## 2013-08-27 DIAGNOSIS — I6529 Occlusion and stenosis of unspecified carotid artery: Secondary | ICD-10-CM

## 2013-09-29 DIAGNOSIS — E7800 Pure hypercholesterolemia, unspecified: Secondary | ICD-10-CM | POA: Diagnosis not present

## 2013-09-29 DIAGNOSIS — Z125 Encounter for screening for malignant neoplasm of prostate: Secondary | ICD-10-CM | POA: Diagnosis not present

## 2013-09-29 DIAGNOSIS — Z79899 Other long term (current) drug therapy: Secondary | ICD-10-CM | POA: Diagnosis not present

## 2013-09-29 DIAGNOSIS — E559 Vitamin D deficiency, unspecified: Secondary | ICD-10-CM | POA: Diagnosis not present

## 2013-09-29 DIAGNOSIS — I10 Essential (primary) hypertension: Secondary | ICD-10-CM | POA: Diagnosis not present

## 2013-09-29 DIAGNOSIS — M810 Age-related osteoporosis without current pathological fracture: Secondary | ICD-10-CM | POA: Diagnosis not present

## 2013-10-06 DIAGNOSIS — I10 Essential (primary) hypertension: Secondary | ICD-10-CM | POA: Diagnosis not present

## 2013-10-06 DIAGNOSIS — R569 Unspecified convulsions: Secondary | ICD-10-CM | POA: Diagnosis not present

## 2013-10-06 DIAGNOSIS — M810 Age-related osteoporosis without current pathological fracture: Secondary | ICD-10-CM | POA: Diagnosis not present

## 2013-10-06 DIAGNOSIS — R82998 Other abnormal findings in urine: Secondary | ICD-10-CM | POA: Diagnosis not present

## 2013-10-06 DIAGNOSIS — I6529 Occlusion and stenosis of unspecified carotid artery: Secondary | ICD-10-CM | POA: Diagnosis not present

## 2013-10-06 DIAGNOSIS — Z1331 Encounter for screening for depression: Secondary | ICD-10-CM | POA: Diagnosis not present

## 2013-10-06 DIAGNOSIS — E7800 Pure hypercholesterolemia, unspecified: Secondary | ICD-10-CM | POA: Diagnosis not present

## 2013-10-06 DIAGNOSIS — Z87891 Personal history of nicotine dependence: Secondary | ICD-10-CM | POA: Diagnosis not present

## 2013-10-06 DIAGNOSIS — Z0000 Encounter for general adult medical examination without abnormal findings: Secondary | ICD-10-CM | POA: Diagnosis not present

## 2013-10-06 DIAGNOSIS — E559 Vitamin D deficiency, unspecified: Secondary | ICD-10-CM | POA: Diagnosis not present

## 2013-12-02 ENCOUNTER — Ambulatory Visit (INDEPENDENT_AMBULATORY_CARE_PROVIDER_SITE_OTHER): Payer: Medicare Other | Admitting: Podiatry

## 2013-12-02 ENCOUNTER — Encounter: Payer: Self-pay | Admitting: Podiatry

## 2013-12-02 VITALS — BP 140/74 | HR 73 | Resp 16

## 2013-12-02 DIAGNOSIS — B351 Tinea unguium: Secondary | ICD-10-CM | POA: Diagnosis not present

## 2013-12-02 DIAGNOSIS — M79609 Pain in unspecified limb: Secondary | ICD-10-CM

## 2013-12-02 NOTE — Progress Notes (Signed)
Subjective:     Patient ID: Richard Mitchell, male   DOB: 06/28/1941, 73 y.o.   MRN: [id number]  HPI patient presents with severe nail disease 1-5 both feet that are thick incurvated yellow and painful   Review of Systems     Objective:   Physical Exam Neurovascular status unchanged with thick damage nailbeds 1-5 both feet that are very painful    Assessment:     Mycotic nail infection with pain 1-5 both feet    Plan:     Debridement painful nailbeds 1-5 both feet with no iatrogenic bleeding noted

## 2014-01-11 DIAGNOSIS — B351 Tinea unguium: Secondary | ICD-10-CM | POA: Diagnosis not present

## 2014-01-11 DIAGNOSIS — M79609 Pain in unspecified limb: Secondary | ICD-10-CM | POA: Diagnosis not present

## 2014-01-11 DIAGNOSIS — L97509 Non-pressure chronic ulcer of other part of unspecified foot with unspecified severity: Secondary | ICD-10-CM | POA: Diagnosis not present

## 2014-01-18 DIAGNOSIS — L97509 Non-pressure chronic ulcer of other part of unspecified foot with unspecified severity: Secondary | ICD-10-CM | POA: Diagnosis not present

## 2014-01-26 DIAGNOSIS — M899 Disorder of bone, unspecified: Secondary | ICD-10-CM | POA: Diagnosis not present

## 2014-02-08 DIAGNOSIS — M79609 Pain in unspecified limb: Secondary | ICD-10-CM | POA: Diagnosis not present

## 2014-02-08 DIAGNOSIS — B351 Tinea unguium: Secondary | ICD-10-CM | POA: Diagnosis not present

## 2014-03-02 ENCOUNTER — Encounter: Payer: Self-pay | Admitting: Family

## 2014-03-03 ENCOUNTER — Encounter: Payer: Self-pay | Admitting: Family

## 2014-03-03 ENCOUNTER — Ambulatory Visit (HOSPITAL_COMMUNITY)
Admission: RE | Admit: 2014-03-03 | Discharge: 2014-03-03 | Disposition: A | Discharge status: Home / Self Care | Payer: Medicare Other | Source: Ambulatory Visit | Attending: Family | Admitting: Family

## 2014-03-03 ENCOUNTER — Ambulatory Visit (INDEPENDENT_AMBULATORY_CARE_PROVIDER_SITE_OTHER): Payer: Medicare Other | Admitting: Family

## 2014-03-03 VITALS — BP 164/88 | HR 66 | Temp 97.4°F | Ht 74.0 in | Wt 175.0 lb

## 2014-03-03 DIAGNOSIS — Z48812 Encounter for surgical aftercare following surgery on the circulatory system: Secondary | ICD-10-CM | POA: Insufficient documentation

## 2014-03-03 DIAGNOSIS — I6529 Occlusion and stenosis of unspecified carotid artery: Secondary | ICD-10-CM

## 2014-03-03 DIAGNOSIS — I63239 Cerebral infarction due to unspecified occlusion or stenosis of unspecified carotid arteries: Secondary | ICD-10-CM | POA: Diagnosis not present

## 2014-03-03 DIAGNOSIS — I739 Peripheral vascular disease, unspecified: Secondary | ICD-10-CM | POA: Diagnosis not present

## 2014-03-03 NOTE — Progress Notes (Signed)
Established Carotid Patient   History of Present Illness  Richard Mitchell is a 73 y.o. male patient of Dr. Kellie Simmering who is s/p left carotid endarterectomy in 2011 for severe asymptomatic left ICA stenosis.   He does have a history of seizures and has a remote history of a right subdural hematoma 1998 following a fall. He walks with a cane. He returns today for follow up. He has not had embolic CVA per wife and pt that they are aware. 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 He denies hemiparesis, denies aphasia. Wife states pt's blood pressure usually runs about 111/60's, is elevated in our office. He has a bony prominence on the plantar surface of his left first metatarsus, skin has trouble healing over this.   Pt denies New Medical or Surgical History.  Pt Diabetic: No Pt smoker: former smoker, quit in 1989  Pt meds include: Statin : Yes ASA: Yes Other anticoagulants/antiplatelets: no   Past Medical History  Diagnosis Date  . Hypertension   . Dysphasia S/P CVA (cerebrovascular accident)   . Carotid artery occlusion   . Seizure disorder   . Osteoporosis   . History of subdural hematoma (post traumatic) Approx 1998  . PAD (peripheral artery disease) 11/12/2012    possible due to diminished pedal pulses  . Gait disturbance 11/12/2012  . Onychomycosis 11/12/2012    extreme neglect x 10  . Keratosis 11/12/2012    x2    Social History History  Substance Use Topics  . Smoking status: Former Smoker    Quit date: 11/19/1987  . Smokeless tobacco: Not on file  . Alcohol Use: No    Family History Family History  Problem Relation Age of Onset  . Heart disease Mother     had pacemaker  . Heart disease Sister     Surgical History Past Surgical History  Procedure Laterality Date  . Carotid endarterectomy Left 10-13-2009  . Subdural hematoma evacuation via craniotomy      No Known Allergies  Current Outpatient Prescriptions  Medication Sig  Dispense Refill  . aspirin 81 MG tablet Take 81 mg by mouth daily.      . carbamazepine (TEGRETOL) 200 MG tablet Take 200 mg by mouth 2 (two) times daily.      Marland Kitchen losartan (COZAAR) 50 MG tablet Take 50 mg by mouth daily.      . simvastatin (ZOCOR) 40 MG tablet Take 40 mg by mouth every evening.      . Vitamin D, Ergocalciferol, (DRISDOL) 50000 UNITS CAPS Take 50,000 Units by mouth every 7 (seven) days.       No current facility-administered medications for this visit.    Review of Systems : See HPI for pertinent positives and negatives.  Physical Examination  Filed Vitals:   03/03/14 1405  BP: 173/86  Pulse: 66  Temp: 97.4 F (36.3 C)  TempSrc: Oral  Height: 6\' 2"  (1.88 m)  Weight: 175 lb (79.379 kg)  SpO2: 98%   Body mass index is 22.46 kg/(m^2).  General: WDWN male in NAD GAIT: using cane, slow and deliberate. Eyes: PERRLA Pulmonary:  Non-labored, CTAB, Negative  Rales, Negative rhonchi, & Negative wheezing.  Cardiac: regular Rhythm ,  Negative detected murmur.  VASCULAR EXAM Carotid Bruits Right Left   Negative Negative    Aorta is not palpable. Radial pulses are 2+ palpable and equal.  LE Pulses Right Left       POPLITEAL  not palpable   not palpable       POSTERIOR TIBIAL  not palpable   not palpable        DORSALIS PEDIS      ANTERIOR TIBIAL not palpable  faintly palpable     Gastrointestinal: soft, nontender, BS WNL, no r/g,  negative masses.  Musculoskeletal: Negative muscle atrophy/wasting. M/S 4/5 throughout, Extremities without ischemic changes, has shallow ulcer over bony prominence plantar surface of left first metatarsus.   Neurologic: A&O X 3; Appropriate Affect ; SENSATION ;normal;  Speech is fluent aphasic, CN 2-12 intact, Pain and light touch intact in extremities, Motor exam as listed above.   Non-Invasive Vascular  Imaging CAROTID DUPLEX 03/03/2014   CEREBROVASCULAR DUPLEX EVALUATION    INDICATION: Carotid artery disease     PREVIOUS INTERVENTION(S): Left carotid endarterectomy 12/20/2010.    DUPLEX EXAM:     RIGHT  LEFT  Peak Systolic Velocities (cm/s) End Diastolic Velocities (cm/s) Plaque LOCATION Peak Systolic Velocities (cm/s) End Diastolic Velocities (cm/s) Plaque  101 14  CCA PROXIMAL 112 14   141 24  CCA MID 112 16 HT  90 17  CCA DISTAL 112 11   162 15 HT ECA 112 13   106 16 CP ICA PROXIMAL 79 10   93 25  ICA MID 83 18   103 31  ICA DISTAL 105 24     0.75 ICA / CCA Ratio (PSV)   Antegrade  Vertebral Flow Antegrade   030 Brachial Systolic Pressure (mmHg) 092  Biphasic Brachial Artery Waveforms Biphasic     Plaque Morphology:  HM = Homogeneous, HT = Heterogeneous, CP = Calcific Plaque, SP = Smooth Plaque, IP = Irregular Plaque     ADDITIONAL FINDINGS:     IMPRESSION: Right internal carotid artery velocities suggest a <40% stenosis.  Patent left carotid endarterectomy site with no evidence of hyperplasia or restenosis.     Compared to the previous exam:  No significant change in comparison to the last exam on 02/24/2013.    Assessment: Richard Mitchell is a 73 y.o. male who presents with asymptomatic minimal right ICA stenosis and patent left carotid endarterectomy site.Slow healing ulcer over  No significant change in comparison to the last exam on 02/24/2013. Bony prominence plantar surface of left first metatarsus with slow healing shallow ulcer, will check ABI's on return.   Plan: Follow-up in 1 year with Carotid Duplex and ABI's.  I discussed in depth with the patient the nature of atherosclerosis, and emphasized the importance of maximal medical management including strict control of blood pressure, blood glucose, and lipid levels, obtaining regular exercise, and continued cessation of smoking.  The patient is aware that without maximal medical management the underlying  atherosclerotic disease process will progress, limiting the benefit of any interventions. The patient was given information about stroke prevention and what symptoms should prompt the patient to seek immediate medical care. Thank you for allowing [place] to participate in this patient's care.  Clemon Chambers, RN, MSN, FNP-C [facility] Office: [phone]  Clinic Physician: Oneida Alar  03/03/2014 2:09 PM

## 2014-03-03 NOTE — Patient Instructions (Signed)
Stroke Prevention Some medical conditions and behaviors are associated with an increased chance of having a stroke. You may prevent a stroke by making healthy choices and managing medical conditions. HOW CAN I REDUCE MY RISK OF HAVING A STROKE?   Stay physically active. Get at least 30 minutes of activity on most or all days.  Do not smoke. It may also be helpful to avoid exposure to secondhand smoke.  Limit alcohol use. Moderate alcohol use is considered to be:  No more than 2 drinks per day for men.  No more than 1 drink per day for nonpregnant women.  Eat healthy foods. This involves:  Eating 5 or more servings of fruits and vegetables a day.  Making dietary changes that address high blood pressure (hypertension), high cholesterol, diabetes, or obesity.  Manage your cholesterol levels.  Making food choices that are high in fiber and low in saturated fat, trans fat, and cholesterol may control cholesterol levels.  Take any prescribed medicines to control cholesterol as directed by your health care provider.  Manage your diabetes.  Controlling your carbohydrate and sugar intake is recommended to manage diabetes.  Take any prescribed medicines to control diabetes as directed by your health care provider.  Control your hypertension.  Making food choices that are low in salt (sodium), saturated fat, trans fat, and cholesterol is recommended to manage hypertension.  Take any prescribed medicines to control hypertension as directed by your health care provider.  Maintain a healthy weight.  Reducing calorie intake and making food choices that are low in sodium, saturated fat, trans fat, and cholesterol are recommended to manage weight.  Stop drug abuse.  Avoid taking birth control pills.  Talk to your health care provider about the risks of taking birth control pills if you are over 35 years old, smoke, get migraines, or have ever had a blood clot.  Get evaluated for sleep  disorders (sleep apnea).  Talk to your health care provider about getting a sleep evaluation if you snore a lot or have excessive sleepiness.  Take medicines only as directed by your health care provider.  For some people, aspirin or blood thinners (anticoagulants) are helpful in reducing the risk of forming abnormal blood clots that can lead to stroke. If you have the irregular heart rhythm of atrial fibrillation, you should be on a blood thinner unless there is a good reason you cannot take them.  Understand all your medicine instructions.  Make sure that other conditions (such as anemia or atherosclerosis) are addressed. SEEK IMMEDIATE MEDICAL CARE IF:   You have sudden weakness or numbness of the face, arm, or leg, especially on one side of the body.  Your face or eyelid droops to one side.  You have sudden confusion.  You have trouble speaking (aphasia) or understanding.  You have sudden trouble seeing in one or both eyes.  You have sudden trouble walking.  You have dizziness.  You have a loss of balance or coordination.  You have a sudden, severe headache with no known cause.  You have new chest pain or an irregular heartbeat. Any of these symptoms may represent a serious problem that is an emergency. Do not wait to see if the symptoms will go away. Get medical help at once. Call your local emergency services (911 in [place] Do not drive yourself to the hospital. Document Released: 08/02/2004 Document Revised: 11/09/2013 Document Reviewed: 12/26/2012 [employer] Patient Information 2015 [employer]. This information is not intended to replace advice given   to you by your health care provider. Make sure you discuss any questions you have with your health care provider.  

## 2014-03-04 NOTE — Addendum Note (Signed)
Addended by: Mena Goes on: 03/04/2014 05:27 PM   Modules accepted: Orders

## 2014-03-08 DIAGNOSIS — M79609 Pain in unspecified limb: Secondary | ICD-10-CM | POA: Diagnosis not present

## 2014-03-08 DIAGNOSIS — B351 Tinea unguium: Secondary | ICD-10-CM | POA: Diagnosis not present

## 2014-03-08 DIAGNOSIS — D2370 Other benign neoplasm of skin of unspecified lower limb, including hip: Secondary | ICD-10-CM | POA: Diagnosis not present

## 2014-04-13 DIAGNOSIS — Z23 Encounter for immunization: Secondary | ICD-10-CM | POA: Diagnosis not present

## 2014-06-23 DIAGNOSIS — I10 Essential (primary) hypertension: Secondary | ICD-10-CM | POA: Diagnosis not present

## 2014-06-23 DIAGNOSIS — Z6822 Body mass index (BMI) 22.0-22.9, adult: Secondary | ICD-10-CM | POA: Diagnosis not present

## 2014-06-23 DIAGNOSIS — G9389 Other specified disorders of brain: Secondary | ICD-10-CM | POA: Diagnosis not present

## 2014-06-23 DIAGNOSIS — K921 Melena: Secondary | ICD-10-CM | POA: Diagnosis not present

## 2014-06-23 DIAGNOSIS — R531 Weakness: Secondary | ICD-10-CM | POA: Diagnosis not present

## 2014-06-23 DIAGNOSIS — R479 Unspecified speech disturbances: Secondary | ICD-10-CM | POA: Diagnosis not present

## 2014-06-23 DIAGNOSIS — R8290 Unspecified abnormal findings in urine: Secondary | ICD-10-CM | POA: Diagnosis not present

## 2014-06-23 DIAGNOSIS — R8299 Other abnormal findings in urine: Secondary | ICD-10-CM | POA: Diagnosis not present

## 2014-06-29 DIAGNOSIS — K921 Melena: Secondary | ICD-10-CM | POA: Diagnosis not present

## 2014-11-03 ENCOUNTER — Other Ambulatory Visit: Payer: Self-pay | Admitting: Dermatology

## 2014-11-23 ENCOUNTER — Other Ambulatory Visit: Payer: Self-pay | Admitting: Dermatology

## 2015-03-07 ENCOUNTER — Encounter: Payer: Self-pay | Admitting: Family

## 2015-03-08 ENCOUNTER — Other Ambulatory Visit (HOSPITAL_COMMUNITY): Payer: Medicare Other

## 2015-03-08 ENCOUNTER — Ambulatory Visit: Payer: Medicare Other | Admitting: Family

## 2015-03-08 ENCOUNTER — Ambulatory Visit (HOSPITAL_COMMUNITY)
Admission: RE | Admit: 2015-03-08 | Discharge: 2015-03-08 | Disposition: A | Discharge status: Home / Self Care | Payer: PPO | Source: Ambulatory Visit | Attending: Family | Admitting: Family

## 2015-03-08 ENCOUNTER — Encounter: Payer: Self-pay | Admitting: Family

## 2015-03-08 ENCOUNTER — Encounter (HOSPITAL_COMMUNITY): Payer: Medicare Other

## 2015-03-08 ENCOUNTER — Ambulatory Visit (INDEPENDENT_AMBULATORY_CARE_PROVIDER_SITE_OTHER): Payer: PPO | Admitting: Family

## 2015-03-08 ENCOUNTER — Ambulatory Visit (INDEPENDENT_AMBULATORY_CARE_PROVIDER_SITE_OTHER)
Admission: RE | Admit: 2015-03-08 | Discharge: 2015-03-08 | Disposition: A | Discharge status: Home / Self Care | Payer: PPO | Source: Ambulatory Visit | Attending: Family | Admitting: Family

## 2015-03-08 VITALS — BP 144/79 | HR 60 | Temp 97.2°F | Resp 16 | Ht 74.5 in | Wt 175.0 lb

## 2015-03-08 DIAGNOSIS — I6523 Occlusion and stenosis of bilateral carotid arteries: Secondary | ICD-10-CM | POA: Diagnosis not present

## 2015-03-08 DIAGNOSIS — I8312 Varicose veins of left lower extremity with inflammation: Secondary | ICD-10-CM

## 2015-03-08 DIAGNOSIS — Z48812 Encounter for surgical aftercare following surgery on the circulatory system: Secondary | ICD-10-CM

## 2015-03-08 DIAGNOSIS — Z9889 Other specified postprocedural states: Secondary | ICD-10-CM

## 2015-03-08 DIAGNOSIS — I739 Peripheral vascular disease, unspecified: Secondary | ICD-10-CM

## 2015-03-08 DIAGNOSIS — I872 Venous insufficiency (chronic) (peripheral): Secondary | ICD-10-CM

## 2015-03-08 DIAGNOSIS — I8311 Varicose veins of right lower extremity with inflammation: Secondary | ICD-10-CM

## 2015-03-08 NOTE — Patient Instructions (Addendum)

## 2015-03-08 NOTE — Progress Notes (Signed)
[facility] HISTORY AND PHYSICAL   MRN : [id number]  History of Present Illness:   Richard Mitchell is a 74 y.o. male patient of Dr. Kellie Simmering who is s/p left carotid endarterectomy in 2011 for severe asymptomatic left ICA stenosis.  He does have a history of seizures and has a remote history of a right subdural hematoma in 1998 following a fall. 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  He walks with a cane. He returns today for follow up. He has not had embolic CVA per wife and pt that they are aware. 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  He denies hemiparesis, denies aphasia. Wife states pt's blood pressure usually runs about 111/60's, is elevated in our office. He has a bony prominence on the plantar surface of his left first metatarsus, skin has trouble healing over this. Wife states pt scratched his right lower leg after his shower as it was "itchy". She also states that he keeps his feet dependant when sitting and does not elevate feet.  Pt denies New Medical or Surgical History.  Pt Diabetic: No Pt smoker: former smoker, quit in 1989  Pt meds include: Statin : Yes ASA: Yes Other anticoagulants/antiplatelets: no    Current Outpatient Prescriptions  Medication Sig Dispense Refill  . aspirin 81 MG tablet Take 81 mg by mouth daily.    . carbamazepine (TEGRETOL) 200 MG tablet Take 200 mg by mouth 2 (two) times daily.    Marland Kitchen losartan (COZAAR) 50 MG tablet Take 50 mg by mouth daily.    . simvastatin (ZOCOR) 40 MG tablet Take 40 mg by mouth every evening.    . terbinafine (LAMISIL) 250 MG tablet Take 250 mg by mouth daily.    . Vitamin D, Ergocalciferol, (DRISDOL) 50000 UNITS CAPS Take 50,000 Units by mouth every 7 (seven) days.     No current facility-administered medications for this visit.     Past Medical History  Diagnosis Date  . Hypertension   . Dysphasia S/P CVA (cerebrovascular accident)   . Carotid artery occlusion   . Seizure disorder   . Osteoporosis   . History of subdural hematoma (post traumatic) Approx 1998  . PAD (peripheral artery disease) 11/12/2012    possible due to diminished pedal pulses  . Gait disturbance 11/12/2012  . Onychomycosis 11/12/2012    extreme neglect x 10  . Keratosis 11/12/2012    x2    Social History Social History  Substance Use Topics  . Smoking status: Former Smoker    Quit date: 11/19/1987  . Smokeless tobacco: Not on file  . Alcohol Use: No    Family History Family History  Problem Relation Age of Onset  . Heart disease Mother     had pacemaker  . Heart disease Sister     Surgical History Past Surgical History  Procedure Laterality Date  . Carotid endarterectomy Left 10-13-2009  . Subdural hematoma evacuation via craniotomy      No Known Allergies  Current Outpatient Prescriptions  Medication Sig Dispense Refill  . aspirin 81 MG tablet Take 81 mg by mouth daily.    . carbamazepine (TEGRETOL) 200 MG tablet Take 200 mg by mouth 2 (two) times daily.    Marland Kitchen losartan (COZAAR) 50 MG tablet Take 50 mg by mouth  daily.    . simvastatin (ZOCOR) 40 MG tablet Take 40 mg by mouth every evening.    . terbinafine (LAMISIL) 250 MG tablet Take 250 mg by mouth daily.    . Vitamin D, Ergocalciferol, (DRISDOL) 50000 UNITS CAPS Take 50,000 Units by mouth every 7 (seven) days.     No current facility-administered medications for this visit.     REVIEW OF SYSTEMS: See HPI for pertinent positives and negatives.  Physical Examination Filed Vitals:   03/08/15 1445 03/08/15 1448  BP: 139/76 144/79  Pulse: 64 60  Temp:  97.2 F (36.2 C)  TempSrc:  Oral  Resp:  16  Height:  6' 2.5" (1.892 m)  Weight:  175 lb (79.379 kg)  SpO2:  98%   Body mass index is 22.17 kg/(m^2).   General: WDWN male in NAD GAIT: using cane,  slow and deliberate. Eyes: Right pupil is constricted and fixed, left pupil is normal size and constricts to light Pulmonary: Non-labored, CTAB, no rales, no rhonchi, & no wheezing.  Cardiac: regular rhythm with frequent premature contractions, no detected murmur.  VASCULAR EXAM Carotid Bruits Right Left   Negative Negative   Aorta is not palpable. Radial pulses are 2+ palpable and equal.      LE Pulses Right Left   POPLITEAL 1+ palpable  1+ palpable   POSTERIOR TIBIAL not palpable  not palpable    DORSALIS PEDIS  ANTERIOR TIBIAL 2+ palpable  1+ palpable     Gastrointestinal: soft, nontender, BS WNL, no r/g,no palpable masses.  Musculoskeletal: Negative muscle atrophy/wasting. M/S 4/5 throughout, Extremities without ischemic changes, has shallow dry ulcer over bony prominence plantar surface of left first metatarsus that is larger today than a year ago, is under treatment by his podiatrist. 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 Both lower legs have dry cracked epidermis.   Neurologic: A&O X 3; Appropriate Affect,  Speech is fluent aphasic, CN 2-12 intact, Pain and light touch intact in extremities, Motor exam as listed above.         Non-Invasive Vascular Imaging (03/08/2015):   CEREBROVASCULAR DUPLEX EVALUATION    INDICATION: Carotid artery disease     PREVIOUS INTERVENTION(S): Left carotid endarterectomy 2011    DUPLEX EXAM:     RIGHT  LEFT  Peak Systolic Velocities (cm/s) End Diastolic Velocities (cm/s) Plaque LOCATION Peak Systolic Velocities (cm/s) End Diastolic Velocities (cm/s) Plaque  99 16  CCA PROXIMAL 143 23   114 21  CCA MID 106 21 HT  146 24  CCA DISTAL 133 21   148 15 HT ECA 147 11   122 18 CP ICA PROXIMAL 52 11   129 27   ICA MID 82 23   82 21  ICA DISTAL 106 27     1.13 ICA / CCA Ratio (PSV) NA  Antegrade  Vertebral Flow Antegrade   962 Brachial Systolic Pressure (mmHg) 229  Multiphasic (Subclavian artery) Brachial Artery Waveforms Multiphasic (Subclavian artery)    Plaque Morphology:  HM = Homogeneous, HT = Heterogeneous, CP = Calcific Plaque, SP = Smooth Plaque, IP = Irregular Plaque     ADDITIONAL FINDINGS:     IMPRESSION: Right internal carotid artery velocities suggest a <40% stenosis.  Patent left carotid endarterectomy site with no evidence of hyperplasia or restenosis.     Compared to the previous exam:  No significant change in comparison to the last exam on 03/03/2014.    ABI (Date: 03/08/2015)  R: [place] (no previous on file), DP: monophasic, PT: monophasic, TBI: could not be obtained  L: [place] (no previous), DP: monophasic, PT: monophasic, TBI: could not be obtained    ASSESSMENT:  Richard Mitchell is a 74 y.o. male who is s/p left carotid endarterectomy in 2011. He has no history of stroke or TIA. Today's carotid Duplex suggests  <40% right ICA stenosis and a patent left carotid endarterectomy site with no evidence of hyperplasia or restenosis. No significant change in comparison to the last exam on 03/03/2014.   Dr. Kellie Simmering spoke with pt and wife and examined pt. He has palpable DP and popliteal pulses. By wife report, he always has his feet dependent at home. 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  ABI's indicate some degree of PAD, however, bilateral DP pulses are palpable, monophasic waveforms on ABI's.   Face to face time with patient was 25 minutes. Over 50% of this time was spent on counseling and coordination of care.   PLAN:    - Pt is advised to discuss with his PCP his frequent premature heart beats.  - Elevate feet above heart, feet above knees, for at least 20 minutes, at least 4 times/day to decrease edema in lower  legs.  - Daily dry gauze dressing change to right lower leg weeping area until weeping areas have healed.   - Daily seated leg exercises as discussed and demonstrated, he has issues with falling and has part of his skull missing on the left side of his head.  Face to face time with patient was 25 minutes. Over 50% of this time was spent on counseling and coordination of care.  Based on today's exam and non-invasive vascular lab results, the patient will follow up in 6 months with the following tests: ABI's, and in a year with carotid Duplex. I discussed in depth with the patient the nature of atherosclerosis, and emphasized the importance of maximal medical management including strict control of blood pressure, blood glucose, and lipid levels, obtaining regular exercise, and cessation of smoking.  The patient is aware that without maximal medical management the underlying atherosclerotic disease process will progress, limiting the benefit of any interventions.  The patient was given information about stroke prevention and what symptoms should prompt the patient to seek immediate medical care.  The patient was given information about PAD including signs, symptoms, treatment, what symptoms should prompt the patient to seek immediate medical care, and risk reduction measures to take. Thank you for allowing [place] to participate in this patient's care.  Clemon Chambers, RN, MSN, FNP-C [facility] Office: [phone]  Clinic MD: Kellie Simmering  03/08/2015 2:41 PM

## 2015-06-16 ENCOUNTER — Encounter: Payer: Self-pay | Admitting: Family

## 2015-06-22 ENCOUNTER — Ambulatory Visit (HOSPITAL_COMMUNITY)
Admission: RE | Admit: 2015-06-22 | Discharge: 2015-06-22 | Disposition: A | Discharge status: Home / Self Care | Payer: PPO | Source: Ambulatory Visit | Attending: Family | Admitting: Family

## 2015-06-22 ENCOUNTER — Ambulatory Visit (INDEPENDENT_AMBULATORY_CARE_PROVIDER_SITE_OTHER): Payer: PPO | Admitting: Family

## 2015-06-22 ENCOUNTER — Encounter: Payer: Self-pay | Admitting: Family

## 2015-06-22 VITALS — BP 119/73 | HR 69 | Temp 97.3°F | Resp 16 | Ht 72.0 in | Wt 175.0 lb

## 2015-06-22 DIAGNOSIS — I739 Peripheral vascular disease, unspecified: Secondary | ICD-10-CM | POA: Diagnosis not present

## 2015-06-22 DIAGNOSIS — I10 Essential (primary) hypertension: Secondary | ICD-10-CM | POA: Insufficient documentation

## 2015-06-22 DIAGNOSIS — I872 Venous insufficiency (chronic) (peripheral): Secondary | ICD-10-CM

## 2015-06-22 DIAGNOSIS — R938 Abnormal findings on diagnostic imaging of other specified body structures: Secondary | ICD-10-CM | POA: Diagnosis not present

## 2015-06-22 DIAGNOSIS — I6523 Occlusion and stenosis of bilateral carotid arteries: Secondary | ICD-10-CM

## 2015-06-22 DIAGNOSIS — Z9889 Other specified postprocedural states: Secondary | ICD-10-CM | POA: Diagnosis not present

## 2015-06-22 DIAGNOSIS — I8312 Varicose veins of left lower extremity with inflammation: Secondary | ICD-10-CM | POA: Insufficient documentation

## 2015-06-22 DIAGNOSIS — I8311 Varicose veins of right lower extremity with inflammation: Secondary | ICD-10-CM | POA: Diagnosis not present

## 2015-06-22 DIAGNOSIS — Z48812 Encounter for surgical aftercare following surgery on the circulatory system: Secondary | ICD-10-CM | POA: Insufficient documentation

## 2015-06-22 NOTE — Patient Instructions (Signed)
Peripheral Vascular Disease  Peripheral vascular disease (PVD) is a disease of the blood vessels that are not part of your heart and brain. A simple term for PVD is poor circulation. In most cases, PVD narrows the blood vessels that carry blood from your heart to the rest of your body. This can result in a decreased supply of blood to your arms, legs, and internal organs, like your stomach or kidneys. However, it most often affects a person's lower legs and feet.  There are two types of PVD.  · Organic PVD. This is the more common type. It is caused by damage to the structure of blood vessels.  · Functional PVD. This is caused by conditions that make blood vessels contract and tighten (spasm).  Without treatment, PVD tends to get worse over time.  PVD can also lead to acute ischemic limb. This is when an arm or limb suddenly has trouble getting enough blood. This is a medical emergency.  CAUSES  Each type of PVD has many different causes. The most common cause of PVD is buildup of a fatty material (plaque) inside of your arteries (atherosclerosis). Small amounts of plaque can break off from the walls of the blood vessels and become lodged in a smaller artery. This blocks blood flow and can cause acute ischemic limb.  Other common causes of PVD include:  · Blood clots that form inside of blood vessels.  · Injuries to blood vessels.  · Diseases that cause inflammation of blood vessels or cause blood vessel spasms.  · Health behaviors and health history that increase your risk of developing PVD.  RISK FACTORS   You may have a greater risk of PVD if you:  · Have a family history of PVD.  · Have certain medical conditions, including:    High cholesterol.    Diabetes.    High blood pressure (hypertension).    Coronary heart disease.    Past problems with blood clots.    Past injury, such as burns or a broken bone. These may have damaged blood vessels in your limbs.    Buerger disease. This is caused by inflamed blood  vessels in your hands and feet.    Some forms of arthritis.    Rare birth defects that affect the arteries in your legs.  · Use tobacco.  · Do not get enough exercise.  · Are obese.  · Are age 50 or older.  SIGNS AND SYMPTOMS   PVD may cause many different symptoms. Your symptoms depend on what part of your body is not getting enough blood. Some common signs and symptoms include:  · Cramps in your lower legs. This may be a symptom of poor leg circulation (claudication).  · Pain and weakness in your legs while you are physically active that goes away when you rest (intermittent claudication).  · Leg pain when at rest.  · Leg numbness, tingling, or weakness.  · Coldness in a leg or foot, especially when compared with the other leg.  · Skin or hair changes. These can include:    Hair loss.    Shiny skin.    Pale or bluish skin.    Thick toenails.  · Inability to get or maintain an erection (erectile dysfunction).  People with PVD are more prone to developing ulcers and sores on their toes, feet, or legs. These may take longer than normal to heal.  DIAGNOSIS  Your health care provider may diagnose PVD from your signs and symptoms.   The health care provider will also do a physical exam. You may have tests to find out what is causing your PVD and determine its severity. Tests may include:  · Blood pressure recordings from your arms and legs and measurements of the strength of your pulses (pulse volume recordings).  · Imaging studies using sound waves to take pictures of the blood flow through your blood vessels (Doppler ultrasound).  · Injecting a dye into your blood vessels before having imaging studies using:    X-rays (angiogram or arteriogram).    Computer-generated X-rays (CT angiogram).    A powerful electromagnetic field and a computer (magnetic resonance angiogram or MRA).  TREATMENT  Treatment for PVD depends on the cause of your condition and the severity of your symptoms. It also depends on your age. Underlying  causes need to be treated and controlled. These include long-lasting (chronic) conditions, such as diabetes, high cholesterol, and high blood pressure. You may need to first try making lifestyle changes and taking medicines. Surgery may be needed if these do not work.  Lifestyle changes may include:  · Quitting smoking.  · Exercising regularly.  · Following a low-fat, low-cholesterol diet.  Medicines may include:  · Blood thinners to prevent blood clots.  · Medicines to improve blood flow.  · Medicines to improve your blood cholesterol levels.  Surgical procedures may include:  · A procedure that uses an inflated balloon to open a blocked artery and improve blood flow (angioplasty).  · A procedure to put in a tube (stent) to keep a blocked artery open (stent implant).  · Surgery to reroute blood flow around a blocked artery (peripheral bypass surgery).  · Surgery to remove dead tissue from an infected wound on the affected limb.  · Amputation. This is surgical removal of the affected limb. This may be necessary in cases of acute ischemic limb that are not improved through medical or surgical treatments.  HOME CARE INSTRUCTIONS  · Take medicines only as directed by your health care provider.  · Do not use any tobacco products, including cigarettes, chewing tobacco, or electronic cigarettes.  If you need help quitting, ask your health care provider.  · Lose weight if you are overweight, and maintain a healthy weight as directed by your health care provider.  · Eat a diet that is low in fat and cholesterol. If you need help, ask your health care provider.  · Exercise regularly. Ask your health care provider to suggest some good activities for you.  · Use compression stockings or other mechanical devices as directed by your health care provider.  · Take good care of your feet.    Wear comfortable shoes that fit well.    Check your feet often for any cuts or sores.  SEEK MEDICAL CARE IF:  · You have cramps in your legs  while walking.  · You have leg pain when you are at rest.  · You have coldness in a leg or foot.  · Your skin changes.  · You have erectile dysfunction.  · You have cuts or sores on your feet that are not healing.  SEEK IMMEDIATE MEDICAL CARE IF:  · Your arm or leg turns cold and blue.  · Your arms or legs become red, warm, swollen, painful, or numb.  · You have chest pain or trouble breathing.  · You suddenly have weakness in your face, arm, or leg.  · You become very confused or lose the ability to speak.  ·   You suddenly have a very bad headache or lose your vision.     This information is not intended to replace advice given to you by your health care provider. Make sure you discuss any questions you have with your health care provider.     Document Released: 08/02/2004 Document Revised: 07/16/2014 Document Reviewed: 12/03/2013  Elsevier Interactive Patient Education ©2016 [employer]    Venous Stasis or Chronic Venous Insufficiency  Chronic venous insufficiency, also called venous stasis, is a condition that affects the veins in the legs. The condition prevents blood from being pumped through these veins effectively. Blood may no longer be pumped effectively from the legs back to the heart. This condition can range from mild to severe. With proper treatment, you should be able to continue with an active life.  CAUSES   Chronic venous insufficiency occurs when the vein walls become stretched, weakened, or damaged or when valves within the vein are damaged. Some common causes of this include:  · High blood pressure inside the veins (venous hypertension).  · Increased blood pressure in the leg veins from long periods of sitting or standing.  · A blood clot that blocks blood flow in a vein (deep vein thrombosis).  · Inflammation of a superficial vein (phlebitis) that causes a blood clot to form.  RISK FACTORS  Various things can make you more likely to develop chronic venous insufficiency, including:  · Family  history of this condition.  · Obesity.  · Pregnancy.  · Sedentary lifestyle.  · Smoking.  · Jobs requiring long periods of standing or sitting in one place.  · Being a certain age. Women in their 40s and 50s and men in their 70s are more likely to develop this condition.  SIGNS AND SYMPTOMS   Symptoms may include:   · Varicose veins.  · Skin breakdown or ulcers.  · Reddened or discolored skin on the leg.  · Brown, smooth, tight, and painful skin just above the ankle, usually on the inside surface (lipodermatosclerosis).  · Swelling.  DIAGNOSIS   To diagnose this condition, your health care provider will take a medical history and do a physical exam. The following tests may be ordered to confirm the diagnosis:  · Duplex ultrasound--A procedure that produces a picture of a blood vessel and nearby organs and also provides information on blood flow through the blood vessel.  · Plethysmography--A procedure that tests blood flow.  · A venogram, or venography--A procedure used to look at the veins using X-ray and dye.  TREATMENT  The goals of treatment are to help you return to an active life and to minimize pain or disability. Treatment will depend on the severity of the condition. Medical procedures may be needed for severe cases. Treatment options may include:   · Use of compression stockings. These can help with symptoms and lower the chances of the problem getting worse, but they do not cure the problem.  · Sclerotherapy--A procedure involving an injection of a material that "dissolves" the damaged veins. Other veins in the network of blood vessels take over the function of the damaged veins.  · Surgery to remove the vein or cut off blood flow through the vein (vein stripping or laser ablation surgery).  · Surgery to repair a valve.  HOME CARE INSTRUCTIONS   · Wear compression stockings as directed by your health care provider.  · Only take over-the-counter or prescription medicines for pain, discomfort, or fever as  directed by your health   care provider.  · Follow up with your health care provider as directed.  SEEK MEDICAL CARE IF:   · You have redness, swelling, or increasing pain in the affected area.  · You see a red streak or line that extends up or down from the affected area.  · You have a breakdown or loss of skin in the affected area, even if the breakdown is small.  · You have an injury to the affected area.  SEEK IMMEDIATE MEDICAL CARE IF:   · You have an injury and open wound in the affected area.  · Your pain is severe and does not improve with medicine.  · You have sudden numbness or weakness in the foot or ankle below the affected area, or you have trouble moving your foot or ankle.  · You have a fever or persistent symptoms for more than 2-3 days.  · You have a fever and your symptoms suddenly get worse.  MAKE SURE YOU:   · Understand these instructions.  · Will watch your condition.  · Will get help right away if you are not doing well or get worse.     This information is not intended to replace advice given to you by your health care provider. Make sure you discuss any questions you have with your health care provider.     Document Released: 10/29/2006 Document Revised: 04/15/2013 Document Reviewed: 03/02/2013  Elsevier Interactive Patient Education ©2016 [employer] Inc[employer]

## 2015-06-22 NOTE — Progress Notes (Signed)
[facility] [place] HISTORY AND PHYSICAL   MRN : [id number]  History of Present Illness:   Richard Mitchell is a 74 y.o. male patient of Dr. Kellie Simmering who is s/p left carotid endarterectomy in 2011 for severe asymptomatic left ICA stenosis.  He also has PAD and chronic venous insufficiency. He returns today for routine follow up.  He does have a history of seizures and has a remote history of a right subdural hematoma in 1998 following a fall. 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 He walks with a cane. He returns today for follow up. He has not had embolic CVA per wife and pt that they are aware. 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  He has been elevating his feet more, and the venous stasis dermatitis has improved. There is no more weeping of serous fluid from the lower legs, and less pitting and non pitting edema in both ankles. He does have moderate ruddy color in both feet and scaling of skin.   He denies hemiparesis, denies aphasia.  He has a bony prominence on the plantar surface of his left first metatarsus, skin has trouble healing over this.  Wife states that he keeps his feet dependant when sitting and does not elevate feet.  Pt denies New Medical or Surgical History.  Pt Diabetic: No Pt smoker: former smoker, quit in 1989  Pt meds include: Statin : Yes ASA: Yes Other anticoagulants/antiplatelets: no    Current Outpatient Prescriptions  Medication Sig Dispense Refill  . aspirin 81 MG tablet Take 81 mg by mouth daily.    . carbamazepine (TEGRETOL) 200 MG tablet Take 200 mg by mouth 2 (two) times daily.    Marland Kitchen losartan-hydrochlorothiazide (HYZAAR) 50-12.5 MG per tablet TK 1 T PO  QD  0  . omeprazole (PRILOSEC) 40 MG capsule daily.  4  . simvastatin (ZOCOR) 40 MG tablet Take 40 mg by mouth every  evening.    . Vitamin D, Ergocalciferol, (DRISDOL) 50000 UNITS CAPS Take 50,000 Units by mouth every 7 (seven) days.    Marland Kitchen losartan (COZAAR) 50 MG tablet Take 50 mg by mouth daily.    Marland Kitchen terbinafine (LAMISIL) 250 MG tablet Take 250 mg by mouth daily. Reported on 06/22/2015     No current facility-administered medications for this visit.    Past Medical History  Diagnosis Date  . Hypertension   . Dysphasia S/P CVA (cerebrovascular accident)   . Carotid artery occlusion   . Seizure disorder ([place])   . Osteoporosis   . History of subdural hematoma (post traumatic) Approx 1998  . PAD (peripheral artery disease) ([place]) 11/12/2012    possible due to diminished pedal pulses  . Gait disturbance 11/12/2012  . Onychomycosis 11/12/2012    extreme neglect x 10  . Keratosis 11/12/2012    x2  . Anemia   . Cancer ([place])     [place]    Social History Social History  Substance Use Topics  . Smoking status: Former Smoker    Quit date: 11/19/1987  . Smokeless tobacco: Never Used  . Alcohol Use: No    Family History Family History  Problem Relation Age of Onset  . Heart disease Mother     had pacemaker  . Cancer Mother     Breast  .  Heart disease Sister   . Cancer Sister     Pancreatic  . Deep vein thrombosis Sister   . Heart attack Sister   . COPD Father     Surgical History Past Surgical History  Procedure Laterality Date  . Carotid endarterectomy Left 10-13-2009  . Subdural hematoma evacuation via craniotomy      No Known Allergies  Current Outpatient Prescriptions  Medication Sig Dispense Refill  . aspirin 81 MG tablet Take 81 mg by mouth daily.    . carbamazepine (TEGRETOL) 200 MG tablet Take 200 mg by mouth 2 (two) times daily.    Marland Kitchen losartan-hydrochlorothiazide (HYZAAR) 50-12.5 MG per tablet TK 1 T PO  QD  0  . omeprazole (PRILOSEC) 40 MG capsule daily.  4  . simvastatin (ZOCOR) 40 MG tablet Take 40 mg by mouth every evening.    . Vitamin D, Ergocalciferol, (DRISDOL)  50000 UNITS CAPS Take 50,000 Units by mouth every 7 (seven) days.    Marland Kitchen losartan (COZAAR) 50 MG tablet Take 50 mg by mouth daily.    Marland Kitchen terbinafine (LAMISIL) 250 MG tablet Take 250 mg by mouth daily. Reported on 06/22/2015     No current facility-administered medications for this visit.     REVIEW OF SYSTEMS: See HPI for pertinent positives and negatives.  Physical Examination Filed Vitals:   06/22/15 1230 06/22/15 1235  BP: 123/66 119/73  Pulse: 67 69  Temp:  97.3 F (36.3 C)  TempSrc:  Oral  Resp:  16  Height:  6' (1.829 m)  Weight:  175 lb (79.379 kg)  SpO2:  96%   Body mass index is 23.73 kg/(m^2).   General: WDWN male in NAD GAIT: using cane, slow and deliberate. Eyes: Right pupil is constricted and fixed, left pupil is normal size and constricts to light Pulmonary: Non-labored, CTAB, no rales, no rhonchi, & no wheezing.  Cardiac: regular rhythm with frequent premature contractions, no detected murmur.  VASCULAR EXAM Carotid Bruits Right Left   Negative Negative   Aorta is not palpable. Radial pulses are 2+ palpable and equal.      LE Pulses Right Left       FEMORAL Not palpable Not palpable    POPLITEAL not palpable  not palpable   POSTERIOR TIBIAL not palpable  not palpable    DORSALIS PEDIS  ANTERIOR TIBIAL not palpable  not palpable     Gastrointestinal: soft, nontender, BS WNL, no r/g,no palpable masses.  Musculoskeletal: Negative muscle atrophy/wasting. M/S 4/5 throughout, Extremities without ischemic changes, has shallow dry ulcer over bony prominence plantar surface of left first metatarsus that is smaller today than a year ago, is under treatment by his podiatrist. Both lower legs with venous stasis dermatitis, markedly improved compared to 03/08/15. Both lower legs  have dry cracked epidermis, improved. Feet are ruddy.  Neurologic: A&O X 3; Appropriate Affect,  Speech is fluent aphasic, CN 2-12 intact, Pain and light touch intact in extremities, Motor exam as listed above               Non-Invasive Vascular Imaging:  ABI (Date: 06/22/2015)  R: [place] ([place], 03/08/15), DP: triphasic, PT: biphasic, TBI: 0.56  L: [place] ([place]), DP: biphasic, PT: monophasic, TBI: 0.59   ASSESSMENT:  Richard Mitchell is a 74 y.o. male who is s/p left carotid endarterectomy in 2011 for severe asymptomatic left ICA stenosis.  He also has PAD and chronic venous insufficiency.  The weeping areas of both lower legs has resolved and the  venous stasis dermatitis has improved since his last visit on 03/08/15. He has been elevating his feet more according to his wife.   Carotid duplex in August of 2016 showed <40% right ICA stenosis and a patent left carotid endarterectomy site with no evidence of hyperplasia or restenosis. No significant change in comparison to the last exam on 03/03/2014.   ABI's remain unreliable due to non-compressible vessels. In August 2016 bilateral ABI waveforms were monophasic; today he has tri and biphasic waveforms in his right leg and bi and monophasic waveforms in his left leg, a significant improvement.   He fell recently, did not injure himself; he is not a candidate for graduated walking program.   Face to face time with patient was 25 minutes. Over 50% of this time was spent on counseling and coordination of care.   PLAN:   Daily seated leg exercises demonstrated and discussed. Elevate legs when not walking. Based on today's exam and non-invasive vascular lab results, the patient will follow up in 6 months with the following tests: ABI's and carotid duplex.. I discussed in depth with the patient the nature of atherosclerosis, and emphasized the importance of maximal medical management including strict control of blood pressure, blood glucose, and  lipid levels, obtaining regular exercise, and cessation of smoking.  The patient is aware that without maximal medical management the underlying atherosclerotic disease process will progress, limiting the benefit of any interventions.  The patient was given information about stroke prevention and what symptoms should prompt the patient to seek immediate medical care.  The patient was given information about PAD including signs, symptoms, treatment, what symptoms should prompt the patient to seek immediate medical care, and risk reduction measures to take. Thank you for allowing [place] to participate in this patient's care.  Clemon Chambers, RN, MSN, FNP-C [facility] Office: [phone]  Clinic MD: Scot Dock  06/22/2015 12:44 PM

## 2015-06-23 NOTE — Addendum Note (Signed)
Addended by: Dorthula Rue L on: 06/23/2015 10:31 AM   Modules accepted: Orders

## 2015-06-24 ENCOUNTER — Ambulatory Visit: Payer: PPO | Admitting: Family

## 2015-06-24 ENCOUNTER — Encounter (HOSPITAL_COMMUNITY): Payer: PPO

## 2015-10-06 DIAGNOSIS — I10 Essential (primary) hypertension: Secondary | ICD-10-CM | POA: Diagnosis not present

## 2015-10-06 DIAGNOSIS — M810 Age-related osteoporosis without current pathological fracture: Secondary | ICD-10-CM | POA: Diagnosis not present

## 2015-10-06 DIAGNOSIS — Z0000 Encounter for general adult medical examination without abnormal findings: Secondary | ICD-10-CM | POA: Diagnosis not present

## 2015-10-06 DIAGNOSIS — E7800 Pure hypercholesterolemia, unspecified: Secondary | ICD-10-CM | POA: Diagnosis not present

## 2015-10-06 DIAGNOSIS — R569 Unspecified convulsions: Secondary | ICD-10-CM | POA: Diagnosis not present

## 2015-10-13 DIAGNOSIS — I6529 Occlusion and stenosis of unspecified carotid artery: Secondary | ICD-10-CM | POA: Diagnosis not present

## 2015-10-13 DIAGNOSIS — I10 Essential (primary) hypertension: Secondary | ICD-10-CM | POA: Diagnosis not present

## 2015-10-13 DIAGNOSIS — M810 Age-related osteoporosis without current pathological fracture: Secondary | ICD-10-CM | POA: Diagnosis not present

## 2015-10-13 DIAGNOSIS — R569 Unspecified convulsions: Secondary | ICD-10-CM | POA: Diagnosis not present

## 2015-10-13 DIAGNOSIS — Z23 Encounter for immunization: Secondary | ICD-10-CM | POA: Diagnosis not present

## 2015-10-13 DIAGNOSIS — Z1389 Encounter for screening for other disorder: Secondary | ICD-10-CM | POA: Diagnosis not present

## 2015-10-13 DIAGNOSIS — N319 Neuromuscular dysfunction of bladder, unspecified: Secondary | ICD-10-CM | POA: Diagnosis not present

## 2015-10-13 DIAGNOSIS — E7800 Pure hypercholesterolemia, unspecified: Secondary | ICD-10-CM | POA: Diagnosis not present

## 2015-10-13 DIAGNOSIS — Z6822 Body mass index (BMI) 22.0-22.9, adult: Secondary | ICD-10-CM | POA: Diagnosis not present

## 2015-10-13 DIAGNOSIS — E559 Vitamin D deficiency, unspecified: Secondary | ICD-10-CM | POA: Diagnosis not present

## 2015-10-13 DIAGNOSIS — I739 Peripheral vascular disease, unspecified: Secondary | ICD-10-CM | POA: Diagnosis not present

## 2015-12-19 ENCOUNTER — Encounter: Payer: Self-pay | Admitting: Family

## 2015-12-26 ENCOUNTER — Encounter (HOSPITAL_COMMUNITY): Payer: PPO

## 2015-12-26 ENCOUNTER — Ambulatory Visit: Payer: PPO | Admitting: Family

## 2015-12-28 ENCOUNTER — Ambulatory Visit (INDEPENDENT_AMBULATORY_CARE_PROVIDER_SITE_OTHER)
Admission: RE | Admit: 2015-12-28 | Discharge: 2015-12-28 | Disposition: A | Discharge status: Home / Self Care | Payer: PPO | Source: Ambulatory Visit | Attending: Family | Admitting: Family

## 2015-12-28 ENCOUNTER — Ambulatory Visit (HOSPITAL_COMMUNITY)
Admission: RE | Admit: 2015-12-28 | Discharge: 2015-12-28 | Disposition: A | Discharge status: Home / Self Care | Payer: PPO | Source: Ambulatory Visit | Attending: Family | Admitting: Family

## 2015-12-28 ENCOUNTER — Encounter: Payer: Self-pay | Admitting: Family

## 2015-12-28 ENCOUNTER — Ambulatory Visit (INDEPENDENT_AMBULATORY_CARE_PROVIDER_SITE_OTHER): Payer: PPO | Admitting: Family

## 2015-12-28 VITALS — BP 159/83 | HR 70 | Temp 97.2°F | Resp 16 | Ht 74.0 in | Wt 176.0 lb

## 2015-12-28 DIAGNOSIS — R938 Abnormal findings on diagnostic imaging of other specified body structures: Secondary | ICD-10-CM | POA: Diagnosis not present

## 2015-12-28 DIAGNOSIS — R0989 Other specified symptoms and signs involving the circulatory and respiratory systems: Secondary | ICD-10-CM | POA: Diagnosis not present

## 2015-12-28 DIAGNOSIS — I872 Venous insufficiency (chronic) (peripheral): Secondary | ICD-10-CM

## 2015-12-28 DIAGNOSIS — I739 Peripheral vascular disease, unspecified: Secondary | ICD-10-CM

## 2015-12-28 DIAGNOSIS — I779 Disorder of arteries and arterioles, unspecified: Secondary | ICD-10-CM

## 2015-12-28 DIAGNOSIS — I8311 Varicose veins of right lower extremity with inflammation: Secondary | ICD-10-CM

## 2015-12-28 DIAGNOSIS — I6522 Occlusion and stenosis of left carotid artery: Secondary | ICD-10-CM

## 2015-12-28 DIAGNOSIS — Z9889 Other specified postprocedural states: Secondary | ICD-10-CM | POA: Diagnosis not present

## 2015-12-28 DIAGNOSIS — Z48812 Encounter for surgical aftercare following surgery on the circulatory system: Secondary | ICD-10-CM

## 2015-12-28 DIAGNOSIS — I10 Essential (primary) hypertension: Secondary | ICD-10-CM | POA: Insufficient documentation

## 2015-12-28 DIAGNOSIS — I8312 Varicose veins of left lower extremity with inflammation: Secondary | ICD-10-CM | POA: Diagnosis not present

## 2015-12-28 DIAGNOSIS — I6523 Occlusion and stenosis of bilateral carotid arteries: Secondary | ICD-10-CM

## 2015-12-28 DIAGNOSIS — I6521 Occlusion and stenosis of right carotid artery: Secondary | ICD-10-CM | POA: Diagnosis not present

## 2015-12-28 LAB — VAS US CAROTID
LCCADDIAS: 25 cm/s
LCCADSYS: 146 cm/s
LEFT ECA DIAS: -14 cm/s
LEFT VERTEBRAL DIAS: -13 cm/s
LICADDIAS: -25 cm/s
LICADSYS: -75 cm/s
LICAPSYS: -86 cm/s
Left ICA prox dias: -18 cm/s
RCCAPDIAS: 16 cm/s
RIGHT CCA MID DIAS: 19 cm/s
RIGHT ECA DIAS: -23 cm/s
RIGHT VERTEBRAL DIAS: 9 cm/s
Right CCA prox sys: 108 cm/s
Right cca dist sys: -101 cm/s

## 2015-12-28 NOTE — Progress Notes (Signed)
[facility] [place] HISTORY AND PHYSICAL   MRN : [id number]  History of Present Illness:   Richard Mitchell is a 75 y.o. male patient of Dr. Kellie Simmering who is s/p left carotid endarterectomy in 2011 for severe asymptomatic left ICA stenosis.  He also has PAD and chronic venous insufficiency. He returns today for routine follow up.  He does have a history of seizures and has a remote history of a right subdural hematoma in 1998 following a fall. 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 He walks with a cane since his head injury, had to relearn how to walk. He does not seem to have claudication symptoms with walking.   He has not had embolic CVA per wife and pt that they are aware. 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  Wife states his blood pressure at home is [id number] systolic, states he feels very cold in this exam room which may be why his blood pressure is elevated.  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  There is no more weeping of serous fluid from the lower legs, and less pitting and non pitting edema in both ankles. He does have moderate ruddy color in both feet and scaling of skin.   He denies hemiparesis, denies aphasia.  He has a bony prominence on the plantar surface of his left first metatarsus, skin has trouble healing over this.  Pt denies New Medical or Surgical History.  Pt Diabetic: No Pt smoker: former smoker, quit in 1989  Pt meds include: Statin : Yes ASA: Yes Other anticoagulants/antiplatelets: no     Current Outpatient Prescriptions  Medication Sig Dispense Refill  . aspirin 81 MG tablet Take 81 mg by mouth daily.    . carbamazepine (TEGRETOL) 200 MG tablet Take 200 mg by mouth 2 (two) times  daily.    Marland Kitchen losartan (COZAAR) 50 MG tablet Take 50 mg by mouth daily.    Marland Kitchen losartan-hydrochlorothiazide (HYZAAR) 50-12.5 MG per tablet TK 1 T PO  QD  0  . omeprazole (PRILOSEC) 40 MG capsule daily.  4  . simvastatin (ZOCOR) 40 MG tablet Take 40 mg by mouth every evening.    . Vitamin D, Ergocalciferol, (DRISDOL) 50000 UNITS CAPS Take 50,000 Units by mouth every 7 (seven) days.    Marland Kitchen terbinafine (LAMISIL) 250 MG tablet Take 250 mg by mouth daily. Reported on 12/28/2015     No current facility-administered medications for this visit.    Past Medical History  Diagnosis Date  . Hypertension   . Dysphasia S/P CVA (cerebrovascular accident)   . Carotid artery occlusion   . Seizure disorder ([place])   . Osteoporosis   . History of subdural hematoma (post traumatic) Approx 1998  . PAD (peripheral artery disease) ([place]) 11/12/2012    possible due to diminished pedal pulses  . Gait disturbance 11/12/2012  . Onychomycosis 11/12/2012    extreme neglect x 10  . Keratosis 11/12/2012    x2  . Anemia   . Cancer ([place])     [place]    Social History Social History  Substance Use Topics  . Smoking status: Former Smoker    Quit date: 11/19/1987  . Smokeless tobacco: Never Used  .  Alcohol Use: No    Family History Family History  Problem Relation Age of Onset  . Heart disease Mother     had pacemaker  . Cancer Mother     Breast  . Heart disease Sister   . Cancer Sister     Pancreatic  . Deep vein thrombosis Sister   . Heart attack Sister   . COPD Father     Surgical History Past Surgical History  Procedure Laterality Date  . Carotid endarterectomy Left 10-13-2009  . Subdural hematoma evacuation via craniotomy      No Known Allergies  Current Outpatient Prescriptions  Medication Sig Dispense Refill  . aspirin 81 MG tablet Take 81 mg by mouth daily.    . carbamazepine (TEGRETOL) 200 MG tablet Take 200 mg by mouth 2 (two) times daily.    Marland Kitchen losartan (COZAAR) 50 MG tablet Take 50 mg by  mouth daily.    Marland Kitchen losartan-hydrochlorothiazide (HYZAAR) 50-12.5 MG per tablet TK 1 T PO  QD  0  . omeprazole (PRILOSEC) 40 MG capsule daily.  4  . simvastatin (ZOCOR) 40 MG tablet Take 40 mg by mouth every evening.    . Vitamin D, Ergocalciferol, (DRISDOL) 50000 UNITS CAPS Take 50,000 Units by mouth every 7 (seven) days.    Marland Kitchen terbinafine (LAMISIL) 250 MG tablet Take 250 mg by mouth daily. Reported on 12/28/2015     No current facility-administered medications for this visit.     REVIEW OF SYSTEMS: See HPI for pertinent positives and negatives.  Physical Examination Filed Vitals:   12/28/15 1528 12/28/15 1530  BP: 160/79 159/83  Pulse: 65 70  Temp: 97.2 F (36.2 C)   Resp: 16   Height: 6\' 2"  (1.88 m)   Weight: 176 lb (79.833 kg)   SpO2: 98%    Body mass index is 22.59 kg/(m^2).  General: WDWN male in NAD GAIT: using cane, slow and deliberate. Eyes: Right pupil is constricted and fixed, left pupil is normal size and constricts to light Pulmonary: Non-labored, CTAB, no rales, no rhonchi, & no wheezing.  Cardiac: regular rhythm with occasional premature contractions, no detected murmur.  VASCULAR EXAM Carotid Bruits Right Left   Negative Negative   Aorta is not palpable. Radial pulses are 2+ palpable and equal.      LE Pulses Right Left   FEMORAL  palpable  palpable    POPLITEAL not palpable  not palpable   POSTERIOR TIBIAL not palpable  not palpable    DORSALIS PEDIS  ANTERIOR TIBIAL not palpable  not palpable     Gastrointestinal: soft, nontender, BS WNL, no r/g,no palpable masses.  Musculoskeletal: No muscle atrophy/wasting. M/S 4/5 throughout, Extremities without ischemic changes, has a callus at the lateral aspect, base of right 5th toe. Both lower  legs with venous stasis dermatitis, markedly improved compared to 03/08/15. Both lower legs have dry cracked epidermis, improved. Feet are ruddy. Pitting edema in both lower legs and feet: trace right, 1+ left.  Neurologic: A&O X 3; Appropriate Affect,  Speech is fluent aphasic, CN 2-12 intact, Pain and light touch intact in extremities, Motor exam as listed above                    Non-Invasive Vascular Imaging (12/28/2015):  CEREBROVASCULAR DUPLEX EVALUATION    INDICATION: Carotid artery disease    PREVIOUS INTERVENTION(S): Left carotid endarterectomy 12/20/2010    DUPLEX EXAM: Carotid duplex    RIGHT  LEFT  Peak Systolic Velocities (cm/s) End  Diastolic Velocities (cm/s) Plaque LOCATION Peak Systolic Velocities (cm/s) End Diastolic Velocities (cm/s) Plaque  108 16  CCA PROXIMAL 133 22 HT  112 19  CCA MID 150 31   128 23 HT CCA DISTAL 146 25 HM  229 21 HT ECA 128 14   185 34 HT ICA PROXIMAL 86 18   90 20  ICA MID 87 25   101 26  ICA DISTAL 75 25     1.6 ICA / CCA Ratio (PSV) NA  Antegrade Vertebral Flow Antegrade  - Brachial Systolic Pressure (mmHg) -  Biphasic Brachial Artery Waveforms Biphasic    Plaque Morphology:  HM = Homogeneous, HT = Heterogeneous, CP = Calcific Plaque, SP = Smooth Plaque, IP = Irregular Plaque     ADDITIONAL FINDINGS: Biphasic right subclavian artery waveform, Trilphasic left subclavian artery waveform    IMPRESSION: 1. Less than 40% right internal carotid artery stenosis. 2. Patent left carotid endarterectomy site with no evidence for restenosis.    Compared to the previous exam:  No change since exam of 03/08/2015    ABI: Right: [place] ([place], 06/22/15), waveforms: mono and biphasic, TBI: 0.48, toe pressure 69 Left: [place] ([place]), waveforms: mono and biphasic, TBI: 0.72, toe pressure: 103    ASSESSMENT:  Richard Mitchell is a 75 y.o. male who is s/p left carotid endarterectomy in 2011 for severe asymptomatic left ICA stenosis.  He also has PAD  and chronic venous insufficiency.  He has a remote history of a right subdural hematoma in 1998 following a fall. He has no known history of embolic stroke or TIA.  He does not seem to walk enough to elicit claudication symptoms. He has no ulcers in his feet/legs, no gangrene. The dependent edema in his feet and lower legs improve the more he elevates his legs.  Carotid duplex today suggests less than 40% right internal carotid artery stenosis and patent left carotid endarterectomy site with no evidence for restenosis. No change since exam of 03/08/2015.  ABI's indicate continued non compressible vessels bilaterally with mono and biphasic waveforms, toe pressures are adequate bilaterally for healing.  Less than 20 mm Hg graduate knee high stocking recommended to minimize dependent edema, ETI outlet directions and information given to wife.   PLAN:   Daily seated leg exercises as discussed an demonstrated. Elevate feet when not walking. Knee high compression hose during the day. Based on today's exam and non-invasive vascular lab results, the patient will follow up in 1 year with the following tests: ABI's, carotid duplex. I discussed in depth with the patient the nature of atherosclerosis, and emphasized the importance of maximal medical management including strict control of blood pressure, blood glucose, and lipid levels, obtaining regular exercise, and cessation of smoking.  The patient is aware that without maximal medical management the underlying atherosclerotic disease process will progress, limiting the benefit of any interventions.  The patient was given information about stroke prevention and what symptoms should prompt the patient to seek immediate medical care.  The patient was given information about PAD including signs, symptoms, treatment, what symptoms should prompt the patient to seek immediate medical care, and risk reduction measures to take. Thank you for allowing [place] to  participate in this patient's care.  Clemon Chambers, RN, MSN, FNP-C [facility] Office: [phone]  Clinic MD: Scot Dock 12/28/2015 3:33 PM

## 2015-12-28 NOTE — Progress Notes (Signed)
Filed Vitals:   12/28/15 1528 12/28/15 1530  BP: 160/79 159/83  Pulse: 65 70  Temp: 97.2 F (36.2 C)   Resp: 16   Height: 6\' 2"  (1.88 m)   Weight: 176 lb (79.833 kg)   SpO2: 98%

## 2015-12-28 NOTE — Patient Instructions (Signed)
Peripheral Vascular Disease Peripheral vascular disease (PVD) is a disease of the blood vessels that are not part of your heart and brain. A simple term for PVD is poor circulation. In most cases, PVD narrows the blood vessels that carry blood from your heart to the rest of your body. This can result in a decreased supply of blood to your arms, legs, and internal organs, like your stomach or kidneys. However, it most often affects a person's lower legs and feet. There are two types of PVD.  Organic PVD. This is the more common type. It is caused by damage to the structure of blood vessels.  Functional PVD. This is caused by conditions that make blood vessels contract and tighten (spasm). Without treatment, PVD tends to get worse over time. PVD can also lead to acute ischemic limb. This is when an arm or limb suddenly has trouble getting enough blood. This is a medical emergency. CAUSES Each type of PVD has many different causes. The most common cause of PVD is buildup of a fatty material (plaque) inside of your arteries (atherosclerosis). Small amounts of plaque can break off from the walls of the blood vessels and become lodged in a smaller artery. This blocks blood flow and can cause acute ischemic limb. Other common causes of PVD include:  Blood clots that form inside of blood vessels.  Injuries to blood vessels.  Diseases that cause inflammation of blood vessels or cause blood vessel spasms.  Health behaviors and health history that increase your risk of developing PVD. RISK FACTORS  You may have a greater risk of PVD if you:  Have a family history of PVD.  Have certain medical conditions, including:  High cholesterol.  Diabetes.  High blood pressure (hypertension).  Coronary heart disease.  Past problems with blood clots.  Past injury, such as burns or a broken bone. These may have damaged blood vessels in your limbs.  Buerger disease. This is caused by inflamed blood  vessels in your hands and feet.  Some forms of arthritis.  Rare birth defects that affect the arteries in your legs.  Use tobacco.  Do not get enough exercise.  Are obese.  Are age 50 or older. SIGNS AND SYMPTOMS  PVD may cause many different symptoms. Your symptoms depend on what part of your body is not getting enough blood. Some common signs and symptoms include:  Cramps in your lower legs. This may be a symptom of poor leg circulation (claudication).  Pain and weakness in your legs while you are physically active that goes away when you rest (intermittent claudication).  Leg pain when at rest.  Leg numbness, tingling, or weakness.  Coldness in a leg or foot, especially when compared with the other leg.  Skin or hair changes. These can include:  Hair loss.  Shiny skin.  Pale or bluish skin.  Thick toenails.  Inability to get or maintain an erection (erectile dysfunction). People with PVD are more prone to developing ulcers and sores on their toes, feet, or legs. These may take longer than normal to heal. DIAGNOSIS Your health care provider may diagnose PVD from your signs and symptoms. The health care provider will also do a physical exam. You may have tests to find out what is causing your PVD and determine its severity. Tests may include:  Blood pressure recordings from your arms and legs and measurements of the strength of your pulses (pulse volume recordings).  Imaging studies using sound waves to take pictures of   the blood flow through your blood vessels (Doppler ultrasound).  Injecting a dye into your blood vessels before having imaging studies using:  X-rays (angiogram or arteriogram).  Computer-generated X-rays (CT angiogram).  A powerful electromagnetic field and a computer (magnetic resonance angiogram or MRA). TREATMENT Treatment for PVD depends on the cause of your condition and the severity of your symptoms. It also depends on your age. Underlying  causes need to be treated and controlled. These include long-lasting (chronic) conditions, such as diabetes, high cholesterol, and high blood pressure. You may need to first try making lifestyle changes and taking medicines. Surgery may be needed if these do not work. Lifestyle changes may include:  Quitting smoking.  Exercising regularly.  Following a low-fat, low-cholesterol diet. Medicines may include:  Blood thinners to prevent blood clots.  Medicines to improve blood flow.  Medicines to improve your blood cholesterol levels. Surgical procedures may include:  A procedure that uses an inflated balloon to open a blocked artery and improve blood flow (angioplasty).  A procedure to put in a tube (stent) to keep a blocked artery open (stent implant).  Surgery to reroute blood flow around a blocked artery (peripheral bypass surgery).  Surgery to remove dead tissue from an infected wound on the affected limb.  Amputation. This is surgical removal of the affected limb. This may be necessary in cases of acute ischemic limb that are not improved through medical or surgical treatments. HOME CARE INSTRUCTIONS  Take medicines only as directed by your health care provider.  Do not use any tobacco products, including cigarettes, chewing tobacco, or electronic cigarettes. If you need help quitting, ask your health care provider.  Lose weight if you are overweight, and maintain a healthy weight as directed by your health care provider.  Eat a diet that is low in fat and cholesterol. If you need help, ask your health care provider.  Exercise regularly. Ask your health care provider to suggest some good activities for you.  Use compression stockings or other mechanical devices as directed by your health care provider.  Take good care of your feet.  Wear comfortable shoes that fit well.  Check your feet often for any cuts or sores. SEEK MEDICAL CARE IF:  You have cramps in your legs  while walking.  You have leg pain when you are at rest.  You have coldness in a leg or foot.  Your skin changes.  You have erectile dysfunction.  You have cuts or sores on your feet that are not healing. SEEK IMMEDIATE MEDICAL CARE IF:  Your arm or leg turns cold and blue.  Your arms or legs become red, warm, swollen, painful, or numb.  You have chest pain or trouble breathing.  You suddenly have weakness in your face, arm, or leg.  You become very confused or lose the ability to speak.  You suddenly have a very bad headache or lose your vision.   This information is not intended to replace advice given to you by your health care provider. Make sure you discuss any questions you have with your health care provider.   Document Released: 08/02/2004 Document Revised: 07/16/2014 Document Reviewed: 12/03/2013 Elsevier Interactive Patient Education 2016 Elsevier Inc.    Stroke Prevention Some medical conditions and behaviors are associated with an increased chance of having a stroke. You may prevent a stroke by making healthy choices and managing medical conditions. HOW CAN I REDUCE MY RISK OF HAVING A STROKE?   Stay physically active. Get at   least 30 minutes of activity on most or all days.  Do not smoke. It may also be helpful to avoid exposure to secondhand smoke.  Limit alcohol use. Moderate alcohol use is considered to be:  No more than 2 drinks per day for men.  No more than 1 drink per day for nonpregnant women.  Eat healthy foods. This involves:  Eating 5 or more servings of fruits and vegetables a day.  Making dietary changes that address high blood pressure (hypertension), high cholesterol, diabetes, or obesity.  Manage your cholesterol levels.  Making food choices that are high in fiber and low in saturated fat, trans fat, and cholesterol may control cholesterol levels.  Take any prescribed medicines to control cholesterol as directed by your health care  provider.  Manage your diabetes.  Controlling your carbohydrate and sugar intake is recommended to manage diabetes.  Take any prescribed medicines to control diabetes as directed by your health care provider.  Control your hypertension.  Making food choices that are low in salt (sodium), saturated fat, trans fat, and cholesterol is recommended to manage hypertension.  Ask your health care provider if you need treatment to lower your blood pressure. Take any prescribed medicines to control hypertension as directed by your health care provider.  If you are 18-39 years of age, have your blood pressure checked every 3-5 years. If you are 40 years of age or older, have your blood pressure checked every year.  Maintain a healthy weight.  Reducing calorie intake and making food choices that are low in sodium, saturated fat, trans fat, and cholesterol are recommended to manage weight.  Stop drug abuse.  Avoid taking birth control pills.  Talk to your health care provider about the risks of taking birth control pills if you are over 35 years old, smoke, get migraines, or have ever had a blood clot.  Get evaluated for sleep disorders (sleep apnea).  Talk to your health care provider about getting a sleep evaluation if you snore a lot or have excessive sleepiness.  Take medicines only as directed by your health care provider.  For some people, aspirin or blood thinners (anticoagulants) are helpful in reducing the risk of forming abnormal blood clots that can lead to stroke. If you have the irregular heart rhythm of atrial fibrillation, you should be on a blood thinner unless there is a good reason you cannot take them.  Understand all your medicine instructions.  Make sure that other conditions (such as anemia or atherosclerosis) are addressed. SEEK IMMEDIATE MEDICAL CARE IF:   You have sudden weakness or numbness of the face, arm, or leg, especially on one side of the body.  Your face  or eyelid droops to one side.  You have sudden confusion.  You have trouble speaking (aphasia) or understanding.  You have sudden trouble seeing in one or both eyes.  You have sudden trouble walking.  You have dizziness.  You have a loss of balance or coordination.  You have a sudden, severe headache with no known cause.  You have new chest pain or an irregular heartbeat. Any of these symptoms may represent a serious problem that is an emergency. Do not wait to see if the symptoms will go away. Get medical help at once. Call your local emergency services (911 in [place] Do not drive yourself to the hospital.   This information is not intended to replace advice given to you by your health care provider. Make sure you discuss any questions   you have with your health care provider.   Document Released: 08/02/2004 Document Revised: 07/16/2014 Document Reviewed: 12/26/2012 Elsevier Interactive Patient Education 2016 Elsevier Inc.    Venous Stasis or Chronic Venous Insufficiency Chronic venous insufficiency, also called venous stasis, is a condition that affects the veins in the legs. The condition prevents blood from being pumped through these veins effectively. Blood may no longer be pumped effectively from the legs back to the heart. This condition can range from mild to severe. With proper treatment, you should be able to continue with an active life. CAUSES  Chronic venous insufficiency occurs when the vein walls become stretched, weakened, or damaged or when valves within the vein are damaged. Some common causes of this include:  High blood pressure inside the veins (venous hypertension).  Increased blood pressure in the leg veins from long periods of sitting or standing.  A blood clot that blocks blood flow in a vein (deep vein thrombosis).  Inflammation of a superficial vein (phlebitis) that causes a blood clot to form. RISK FACTORS Various things can make you more likely to  develop chronic venous insufficiency, including:  Family history of this condition.  Obesity.  Pregnancy.  Sedentary lifestyle.  Smoking.  Jobs requiring long periods of standing or sitting in one place.  Being a certain age. Women in their 72s and 47s and men in their 56s are more likely to develop this condition. SIGNS AND SYMPTOMS  Symptoms may include:   Varicose veins.  Skin breakdown or ulcers.  Reddened or discolored skin on the leg.  Brown, smooth, tight, and painful skin just above the ankle, usually on the inside surface (lipodermatosclerosis).  Swelling. DIAGNOSIS  To diagnose this condition, your health care provider will take a medical history and do a physical exam. The following tests may be ordered to confirm the diagnosis:  Duplex ultrasound--A procedure that produces a picture of a blood vessel and nearby organs and also provides information on blood flow through the blood vessel.  Plethysmography--A procedure that tests blood flow.  A venogram, or venography--A procedure used to look at the veins using X-ray and dye. TREATMENT The goals of treatment are to help you return to an active life and to minimize pain or disability. Treatment will depend on the severity of the condition. Medical procedures may be needed for severe cases. Treatment options may include:   Use of compression stockings. These can help with symptoms and lower the chances of the problem getting worse, but they do not cure the problem.  Sclerotherapy--A procedure involving an injection of a material that "dissolves" the damaged veins. Other veins in the network of blood vessels take over the function of the damaged veins.  Surgery to remove the vein or cut off blood flow through the vein (vein stripping or laser ablation surgery).  Surgery to repair a valve. HOME CARE INSTRUCTIONS   Wear compression stockings as directed by your health care provider.  Only take over-the-counter or  prescription medicines for pain, discomfort, or fever as directed by your health care provider.  Follow up with your health care provider as directed. SEEK MEDICAL CARE IF:   You have redness, swelling, or increasing pain in the affected area.  You see a red streak or line that extends up or down from the affected area.  You have a breakdown or loss of skin in the affected area, even if the breakdown is small.  You have an injury to the affected area. Richard Mitchell  CARE IF:   You have an injury and open wound in the affected area.  Your pain is severe and does not improve with medicine.  You have sudden numbness or weakness in the foot or ankle below the affected area, or you have trouble moving your foot or ankle.  You have a fever or persistent symptoms for more than 2-3 days.  You have a fever and your symptoms suddenly get worse. MAKE SURE YOU:   Understand these instructions.  Will watch your condition.  Will get help right away if you are not doing well or get worse.   This information is not intended to replace advice given to you by your health care provider. Make sure you discuss any questions you have with your health care provider.   Document Released: 10/29/2006 Document Revised: 04/15/2013 Document Reviewed: 03/02/2013 Elsevier Interactive Patient Education [employer].

## 2016-02-07 DIAGNOSIS — M810 Age-related osteoporosis without current pathological fracture: Secondary | ICD-10-CM | POA: Diagnosis not present

## 2016-02-07 DIAGNOSIS — E559 Vitamin D deficiency, unspecified: Secondary | ICD-10-CM | POA: Diagnosis not present

## 2016-02-29 DIAGNOSIS — H524 Presbyopia: Secondary | ICD-10-CM | POA: Diagnosis not present

## 2016-02-29 DIAGNOSIS — Z961 Presence of intraocular lens: Secondary | ICD-10-CM | POA: Diagnosis not present

## 2016-02-29 DIAGNOSIS — Z8669 Personal history of other diseases of the nervous system and sense organs: Secondary | ICD-10-CM | POA: Diagnosis not present

## 2016-02-29 DIAGNOSIS — H52203 Unspecified astigmatism, bilateral: Secondary | ICD-10-CM | POA: Diagnosis not present

## 2016-02-29 DIAGNOSIS — H31011 Macula scars of posterior pole (postinflammatory) (post-traumatic), right eye: Secondary | ICD-10-CM | POA: Diagnosis not present

## 2016-03-13 ENCOUNTER — Encounter (HOSPITAL_COMMUNITY): Payer: PPO

## 2016-03-13 ENCOUNTER — Ambulatory Visit: Payer: PPO | Admitting: Family

## 2016-04-03 DIAGNOSIS — E559 Vitamin D deficiency, unspecified: Secondary | ICD-10-CM | POA: Diagnosis not present

## 2016-04-03 DIAGNOSIS — Z79899 Other long term (current) drug therapy: Secondary | ICD-10-CM | POA: Diagnosis not present

## 2016-04-03 DIAGNOSIS — M810 Age-related osteoporosis without current pathological fracture: Secondary | ICD-10-CM | POA: Diagnosis not present

## 2016-04-03 DIAGNOSIS — Z6822 Body mass index (BMI) 22.0-22.9, adult: Secondary | ICD-10-CM | POA: Diagnosis not present

## 2016-04-11 ENCOUNTER — Other Ambulatory Visit (HOSPITAL_COMMUNITY): Payer: Self-pay | Admitting: *Deleted

## 2016-04-12 ENCOUNTER — Ambulatory Visit (HOSPITAL_COMMUNITY)
Admission: RE | Admit: 2016-04-12 | Discharge: 2016-04-12 | Disposition: A | Discharge status: Home / Self Care | Payer: PPO | Source: Ambulatory Visit | Attending: Internal Medicine | Admitting: Internal Medicine

## 2016-04-12 DIAGNOSIS — M810 Age-related osteoporosis without current pathological fracture: Secondary | ICD-10-CM | POA: Diagnosis not present

## 2016-04-12 MED ORDER — ZOLEDRONIC ACID 5 MG/100ML IV SOLN
5.0000 mg | Freq: Once | INTRAVENOUS | Status: AC
  Administered 2016-04-12: 5 mg via INTRAVENOUS

## 2016-04-12 MED ORDER — ZOLEDRONIC ACID 5 MG/100ML IV SOLN
INTRAVENOUS | Status: AC
  Filled 2016-04-12: qty 100

## 2016-04-12 NOTE — Discharge Instructions (Signed)

## 2016-07-18 NOTE — Addendum Note (Signed)
Addended by: Lianne Cure A on: 07/18/2016 10:26 AM   Modules accepted: Orders

## 2016-10-11 DIAGNOSIS — I10 Essential (primary) hypertension: Secondary | ICD-10-CM | POA: Diagnosis not present

## 2016-10-11 DIAGNOSIS — R569 Unspecified convulsions: Secondary | ICD-10-CM | POA: Diagnosis not present

## 2016-10-11 DIAGNOSIS — Z125 Encounter for screening for malignant neoplasm of prostate: Secondary | ICD-10-CM | POA: Diagnosis not present

## 2016-10-11 DIAGNOSIS — E784 Other hyperlipidemia: Secondary | ICD-10-CM | POA: Diagnosis not present

## 2016-10-11 DIAGNOSIS — M810 Age-related osteoporosis without current pathological fracture: Secondary | ICD-10-CM | POA: Diagnosis not present

## 2016-10-18 DIAGNOSIS — M810 Age-related osteoporosis without current pathological fracture: Secondary | ICD-10-CM | POA: Diagnosis not present

## 2016-10-18 DIAGNOSIS — E7800 Pure hypercholesterolemia, unspecified: Secondary | ICD-10-CM | POA: Diagnosis not present

## 2016-10-18 DIAGNOSIS — E559 Vitamin D deficiency, unspecified: Secondary | ICD-10-CM | POA: Diagnosis not present

## 2016-10-18 DIAGNOSIS — G250 Essential tremor: Secondary | ICD-10-CM | POA: Diagnosis not present

## 2016-10-18 DIAGNOSIS — I10 Essential (primary) hypertension: Secondary | ICD-10-CM | POA: Diagnosis not present

## 2016-10-18 DIAGNOSIS — Z6822 Body mass index (BMI) 22.0-22.9, adult: Secondary | ICD-10-CM | POA: Diagnosis not present

## 2016-10-18 DIAGNOSIS — Z0000 Encounter for general adult medical examination without abnormal findings: Secondary | ICD-10-CM | POA: Diagnosis not present

## 2016-10-18 DIAGNOSIS — N318 Other neuromuscular dysfunction of bladder: Secondary | ICD-10-CM | POA: Diagnosis not present

## 2016-10-18 DIAGNOSIS — I6529 Occlusion and stenosis of unspecified carotid artery: Secondary | ICD-10-CM | POA: Diagnosis not present

## 2016-10-18 DIAGNOSIS — Z1389 Encounter for screening for other disorder: Secondary | ICD-10-CM | POA: Diagnosis not present

## 2016-10-18 DIAGNOSIS — I7389 Other specified peripheral vascular diseases: Secondary | ICD-10-CM | POA: Diagnosis not present

## 2016-12-19 ENCOUNTER — Encounter: Payer: Self-pay | Admitting: Family

## 2017-01-01 ENCOUNTER — Encounter: Payer: Self-pay | Admitting: Family

## 2017-01-01 ENCOUNTER — Ambulatory Visit (INDEPENDENT_AMBULATORY_CARE_PROVIDER_SITE_OTHER): Payer: PPO | Admitting: Family

## 2017-01-01 ENCOUNTER — Ambulatory Visit (HOSPITAL_COMMUNITY)
Admission: RE | Admit: 2017-01-01 | Discharge: 2017-01-01 | Disposition: A | Discharge status: Home / Self Care | Payer: PPO | Source: Ambulatory Visit | Attending: Vascular Surgery | Admitting: Vascular Surgery

## 2017-01-01 ENCOUNTER — Ambulatory Visit (INDEPENDENT_AMBULATORY_CARE_PROVIDER_SITE_OTHER)
Admission: RE | Admit: 2017-01-01 | Discharge: 2017-01-01 | Disposition: A | Discharge status: Home / Self Care | Payer: PPO | Source: Ambulatory Visit | Attending: Vascular Surgery | Admitting: Vascular Surgery

## 2017-01-01 VITALS — BP 139/83 | HR 70 | Temp 97.9°F | Resp 18 | Ht 74.0 in | Wt 162.0 lb

## 2017-01-01 DIAGNOSIS — I872 Venous insufficiency (chronic) (peripheral): Secondary | ICD-10-CM

## 2017-01-01 DIAGNOSIS — Z9889 Other specified postprocedural states: Secondary | ICD-10-CM

## 2017-01-01 DIAGNOSIS — I779 Disorder of arteries and arterioles, unspecified: Secondary | ICD-10-CM | POA: Diagnosis not present

## 2017-01-01 DIAGNOSIS — I6522 Occlusion and stenosis of left carotid artery: Secondary | ICD-10-CM | POA: Diagnosis not present

## 2017-01-01 DIAGNOSIS — Z48812 Encounter for surgical aftercare following surgery on the circulatory system: Secondary | ICD-10-CM | POA: Diagnosis not present

## 2017-01-01 LAB — VAS US CAROTID
LCCADDIAS: 31 cm/s
LCCADSYS: 148 cm/s
LCCAPSYS: 169 cm/s
LEFT ECA DIAS: -21 cm/s
LEFT VERTEBRAL DIAS: -24 cm/s
LICADDIAS: -37 cm/s
LICADSYS: -121 cm/s
Left CCA prox dias: 28 cm/s
Left ICA prox dias: -25 cm/s
Left ICA prox sys: -74 cm/s
RCCADSYS: -105 cm/s
RCCAPDIAS: 20 cm/s
RIGHT CCA MID DIAS: 28 cm/s
RIGHT ECA DIAS: -19 cm/s
RIGHT VERTEBRAL DIAS: -13 cm/s
Right CCA prox sys: 114 cm/s

## 2017-01-01 NOTE — Progress Notes (Signed)
[facility] [place] HISTORY AND PHYSICAL   MRN : [id number]  History of Present Illness:   Richard Mitchell is a 76 y.o. male patient of Dr. Kellie Simmering who is s/p left carotid endarterectomy in 2011 for severe asymptomatic left ICA stenosis.  He also has PAD and chronic venous insufficiency. He returns today for routine follow up.  He does have a history of seizures and has a remote history of a right subdural hematoma in 1998 following a fall. 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 He walks with a cane since his head injury, had to relearn how to walk. He does not seem to have claudication symptoms with walking.   He has not had embolic CVA per wife and pt that they are aware. 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  There is no more weeping of serous fluid from the lower legs, and less pitting and non pitting edema in both ankles. He does have moderate ruddy color in both feet and scaling of skin.   He denies hemiparesis, denies aphasia.  He has a bony prominence on the plantar surface of his left first metatarsus, skin has trouble healing over this.   Pt Diabetic: No Pt smoker: former smoker, quit in 1989  Pt meds include: Statin : Yes ASA: Yes Other anticoagulants/antiplatelets: no    Current Outpatient Prescriptions  Medication Sig Dispense Refill  . aspirin 81 MG tablet Take 81 mg by mouth daily.    . carbamazepine (TEGRETOL) 200 MG tablet Take 200 mg by mouth 2 (two) times daily.    Marland Kitchen losartan (COZAAR) 50 MG tablet Take 50 mg by mouth daily.    Marland Kitchen losartan-hydrochlorothiazide (HYZAAR) 50-12.5 MG per tablet TK 1 T PO  QD  0  . rosuvastatin (CRESTOR) 20 MG tablet TK 1 T  PO D  3  . simvastatin (ZOCOR) 40 MG tablet Take 40 mg by mouth every evening.    . Vitamin D, Ergocalciferol, (DRISDOL) 50000 UNITS CAPS Take 50,000 Units by mouth every 7 (seven) days.    Marland Kitchen omeprazole (PRILOSEC) 40 MG capsule daily.  4  . terbinafine (LAMISIL) 250 MG tablet Take 250 mg by mouth daily. Reported on 12/28/2015     No current facility-administered medications for this visit.     Past Medical History:  Diagnosis Date  . Anemia   . Cancer ([place])    [place]  . Carotid artery occlusion   . Dysphasia S/P CVA (cerebrovascular accident)   . Gait disturbance 11/12/2012  . History of subdural hematoma (post traumatic) Approx 1998  . Hypertension   . Keratosis 11/12/2012   x2  . Onychomycosis 11/12/2012   extreme neglect x 10  . Osteoporosis   . PAD (peripheral artery disease) ([place]) 11/12/2012   possible due to diminished pedal pulses  . Seizure disorder [facility])     Social History Social History  Substance Use Topics  . Smoking status: Former Smoker    Quit date: 11/19/1987  . Smokeless tobacco: Never Used  . Alcohol use No    Family History Family History  Problem Relation Age of Onset  . Heart disease Mother  had pacemaker  . Cancer Mother        Breast  . Heart disease Sister   . Cancer Sister        Pancreatic  . Deep vein thrombosis Sister   . Heart attack Sister   . COPD Father     Surgical History Past Surgical History:  Procedure Laterality Date  . CAROTID ENDARTERECTOMY Left 10-13-2009  . SUBDURAL HEMATOMA EVACUATION VIA CRANIOTOMY      No Known Allergies  Current Outpatient Prescriptions  Medication Sig Dispense Refill  . aspirin 81 MG tablet Take 81 mg by mouth daily.    . carbamazepine (TEGRETOL) 200 MG tablet Take 200 mg by mouth 2 (two) times daily.    Marland Kitchen losartan (COZAAR) 50 MG tablet Take 50 mg by mouth daily.    Marland Kitchen losartan-hydrochlorothiazide (HYZAAR) 50-12.5 MG per tablet TK 1 T PO  QD  0  . rosuvastatin (CRESTOR) 20 MG tablet TK  1 T PO D  3  . simvastatin (ZOCOR) 40 MG tablet Take 40 mg by mouth every evening.    . Vitamin D, Ergocalciferol, (DRISDOL) 50000 UNITS CAPS Take 50,000 Units by mouth every 7 (seven) days.    Marland Kitchen omeprazole (PRILOSEC) 40 MG capsule daily.  4  . terbinafine (LAMISIL) 250 MG tablet Take 250 mg by mouth daily. Reported on 12/28/2015     No current facility-administered medications for this visit.      REVIEW OF SYSTEMS: See HPI for pertinent positives and negatives.  Physical Examination Vitals:   01/01/17 1440 01/01/17 1443  BP: (!) 151/80 139/83  Pulse: 70   Resp: 18   Temp: 97.9 F (36.6 C)   TempSrc: Oral   SpO2: 95%   Weight: 162 lb (73.5 kg)   Height: 6\' 2"  (1.88 m)    Body mass index is 20.8 kg/m.  General:  WDWN male in NAD. Soft area at right side of head where part of his skull was surgically removed.  GAIT: using cane, slow and deliberate. Eyes: PERRLA Pulmonary: Respirations are non-labored, CTAB, no rales, no rhonchi, or wheezing.  Cardiac: regular rhythm and rate, no detected murmur.  VASCULAR EXAM Carotid Bruits Right Left   Negative Negative   Aorta is not palpable. Radial pulses are 2+ palpable and equal.      LE Pulses Right Left   FEMORAL  palpable  palpable    POPLITEAL not palpable  not palpable   POSTERIOR TIBIAL not palpable  not palpable    DORSALIS PEDIS  ANTERIOR TIBIAL not palpable  not palpable     Gastrointestinal: soft, nontender, BS WNL, no r/g,no palpable masses.  Musculoskeletal: No muscle atrophy/wasting. M/S 4/5 throughout, Extremities without ischemic changes. Both lower legs with venous stasis dermatitis, markedly improved compared to previous visits. Both lower legs have dry cracked epidermis, improved. Feet are  ruddy. 1+ non pitting edema in both lower legs and feet. Hands are ruddy and cool.   Neurologic: A&O X 3; Appropriate Affect,  Speech is fluent aphasic, CN 2-12 intact except left facial droop with smile, Pain and light touch intact in extremities, Motor exam as listed        ASSESSMENT:  Richard Mitchell is a 76 y.o. male  who is s/p left carotid endarterectomy in 2011 for severe asymptomatic left ICA stenosis.  He also has PAD and chronic venous insufficiency.  He has a remote history of a right subdural hematoma in 1998 following a fall. He has no known history  of embolic stroke or TIA.  He does not seem to walk enough to elicit claudication symptoms. He has no ulcers in his feet/legs, no gangrene. The dependent edema in his feet and lower legs improve the more he elevates his legs.  His hands and feet are very cool and ruddy with the appearance of Raynaud's Syndrome. He feels more tired and light headed lately, and still has trouble urinating after seeing a urologist. I advised him and his wife to discuss this with his PCP.      DATA  Carotid Duplex (01/01/17): <40% right internal carotid artery stenosis and patent left carotid endarterectomy site with no evidence for restenosis. Bilateral vertebral artery flow is antegrade.  Bilateral subclavian artery waveforms are normal.  No change since exams of 03/08/2015 and 12/28/2015.    ABI (Date: 01/01/2017)  R:   ABI: 1.1 ([place] on 12-28-15),   PT: mono  DP: tri  TBI:  0.42 (was 0.48)  L:   ABI: 0.63 at peroneal (was [place]),   PT: absent (was mono)  DP: mono (was bi)  TBI: 0.42 (was 0.72) Right ABI waveform at the DP improved from bo to triphasic, improved from [place] to 1.1. Left ABI waveform morphology has declined in PT (now absent, was monophasic) and DP (now monophasic, was biphasic), peroneal is monophasic. Left TBI declined, right TBI remains stable but abnormal.   PLAN:   Daily seated leg exercises as  discussed an demonstrated. Elevate feet when not walking. Knee high compression hose during the day. Based on today's exam and non-invasive vascular lab results, the patient will follow up in 1 year with the following tests: ABI's, carotid duplex.   I discussed in depth with the patient the nature of atherosclerosis, and emphasized the importance of maximal medical management including strict control of blood pressure, blood glucose, and lipid levels, obtaining regular exercise, and cessation of smoking.  The patient is aware that without maximal medical management the underlying atherosclerotic disease process will progress, limiting the benefit of any interventions.  The patient was given information about stroke prevention and what symptoms should prompt the patient to seek immediate medical care.  The patient was given information about PAD including signs, symptoms, treatment, what symptoms should prompt the patient to seek immediate medical care, and risk reduction measures to take.  Thank you for allowing [place] to participate in this patient's care.  Clemon Chambers, RN, MSN, FNP-C [facility] Office: [phone]  Clinic MD: Early 01/01/2017 2:47 PM

## 2017-01-01 NOTE — Patient Instructions (Signed)
Peripheral Vascular Disease Peripheral vascular disease (PVD) is a disease of the blood vessels that are not part of your heart and brain. A simple term for PVD is poor circulation. In most cases, PVD narrows the blood vessels that carry blood from your heart to the rest of your body. This can result in a decreased supply of blood to your arms, legs, and internal organs, like your stomach or kidneys. However, it most often affects a person's lower legs and feet. There are two types of PVD.  Organic PVD. This is the more common type. It is caused by damage to the structure of blood vessels.  Functional PVD. This is caused by conditions that make blood vessels contract and tighten (spasm).  Without treatment, PVD tends to get worse over time. PVD can also lead to acute ischemic limb. This is when an arm or limb suddenly has trouble getting enough blood. This is a medical emergency. Follow these instructions at home:  Take medicines only as told by your doctor.  Do not use any tobacco products, including cigarettes, chewing tobacco, or electronic cigarettes. If you need help quitting, ask your doctor.  Lose weight if you are overweight, and maintain a healthy weight as told by your doctor.  Eat a diet that is low in fat and cholesterol. If you need help, ask your doctor.  Exercise regularly. Ask your doctor for some good activities for you.  Take good care of your feet. ? Wear comfortable shoes that fit well. ? Check your feet often for any cuts or sores. Contact a doctor if:  You have cramps in your legs while walking.  You have leg pain when you are at rest.  You have coldness in a leg or foot.  Your skin changes.  You are unable to get or have an erection (erectile dysfunction).  You have cuts or sores on your feet that are not healing. Get help right away if:  Your arm or leg turns cold and blue.  Your arms or legs become red, warm, swollen, painful, or numb.  You have  chest pain or trouble breathing.  You suddenly have weakness in your face, arm, or leg.  You become very confused or you cannot speak.  You suddenly have a very bad headache.  You suddenly cannot see. This information is not intended to replace advice given to you by your health care provider. Make sure you discuss any questions you have with your health care provider. Document Released: 09/19/2009 Document Revised: 12/01/2015 Document Reviewed: 12/03/2013 Elsevier Interactive Patient Education  2017 [employer].    Stroke Prevention Some medical conditions and behaviors are associated with an increased chance of having a stroke. You may prevent a stroke by making healthy choices and managing medical conditions. How can I reduce my risk of having a stroke?  Stay physically active. Get at least 30 minutes of activity on most or all days.  Do not smoke. It may also be helpful to avoid exposure to secondhand smoke.  Limit alcohol use. Moderate alcohol use is considered to be: ? No more than 2 drinks per day for men. ? No more than 1 drink per day for nonpregnant women.  Eat healthy foods. This involves: ? Eating 5 or more servings of fruits and vegetables a day. ? Making dietary changes that address high blood pressure (hypertension), high cholesterol, diabetes, or obesity.  Manage your cholesterol levels. ? Making food choices that are high in fiber and low in saturated  fat, trans fat, and cholesterol may control cholesterol levels. ? Take any prescribed medicines to control cholesterol as directed by your health care provider.  Manage your diabetes. ? Controlling your carbohydrate and sugar intake is recommended to manage diabetes. ? Take any prescribed medicines to control diabetes as directed by your health care provider.  Control your hypertension. ? Making food choices that are low in salt (sodium), saturated fat, trans fat, and cholesterol is recommended to manage  hypertension. ? Ask your health care provider if you need treatment to lower your blood pressure. Take any prescribed medicines to control hypertension as directed by your health care provider. ? If you are 55-43 years of age, have your blood pressure checked every 3-5 years. If you are 56 years of age or older, have your blood pressure checked every year.  Maintain a healthy weight. ? Reducing calorie intake and making food choices that are low in sodium, saturated fat, trans fat, and cholesterol are recommended to manage weight.  Stop drug abuse.  Avoid taking birth control pills. ? Talk to your health care provider about the risks of taking birth control pills if you are over 57 years old, smoke, get migraines, or have ever had a blood clot.  Get evaluated for sleep disorders (sleep apnea). ? Talk to your health care provider about getting a sleep evaluation if you snore a lot or have excessive sleepiness.  Take medicines only as directed by your health care provider. ? For some people, aspirin or blood thinners (anticoagulants) are helpful in reducing the risk of forming abnormal blood clots that can lead to stroke. If you have the irregular heart rhythm of atrial fibrillation, you should be on a blood thinner unless there is a good reason you cannot take them. ? Understand all your medicine instructions.  Make sure that other conditions (such as anemia or atherosclerosis) are addressed. Get help right away if:  You have sudden weakness or numbness of the face, arm, or leg, especially on one side of the body.  Your face or eyelid droops to one side.  You have sudden confusion.  You have trouble speaking (aphasia) or understanding.  You have sudden trouble seeing in one or both eyes.  You have sudden trouble walking.  You have dizziness.  You have a loss of balance or coordination.  You have a sudden, severe headache with no known cause.  You have new chest pain or an  irregular heartbeat. Any of these symptoms may represent a serious problem that is an emergency. Do not wait to see if the symptoms will go away. Get medical help at once. Call your local emergency services (911 in [place] Do not drive yourself to the hospital. This information is not intended to replace advice given to you by your health care provider. Make sure you discuss any questions you have with your health care provider. Document Released: 08/02/2004 Document Revised: 12/01/2015 Document Reviewed: 12/26/2012 Elsevier Interactive Patient Education  2017 [employer]      Chronic Venous Insufficiency Chronic venous insufficiency, also called venous stasis, is a condition that prevents blood from being pumped effectively through the veins in your legs. Blood may no longer be pumped effectively from the legs back to the heart. This condition can range from mild to severe. With proper treatment, you should be able to continue with an active life. What are the causes? Chronic venous insufficiency occurs when the vein walls become stretched, weakened, or damaged, or when valves within  the vein are damaged. Some common causes of this include:  High blood pressure inside the veins (venous hypertension).  Increased blood pressure in the leg veins from long periods of sitting or standing.  A blood clot that blocks blood flow in a vein (deep vein thrombosis, DVT).  Inflammation of a vein (phlebitis) that causes a blood clot to form.  Tumors in the pelvis that cause blood to back up.  What increases the risk? The following factors may make you more likely to develop this condition:  Having a family history of this condition.  Obesity.  Pregnancy.  Living without enough physical activity or exercise (sedentary lifestyle).  Smoking.  Having a job that requires long periods of standing or sitting in one place.  Being a certain age. Women in their 31s and 35s and men in their 43s are  more likely to develop this condition.  What are the signs or symptoms? Symptoms of this condition include:  Veins that are enlarged, bulging, or twisted (varicose veins).  Skin breakdown or ulcers.  Reddened or discolored skin on the front of the leg.  Brown, smooth, tight, and painful skin just above the ankle, usually on the inside of the leg (lipodermatosclerosis).  Swelling.  How is this diagnosed? This condition may be diagnosed based on:  Your medical history.  A physical exam.  Tests, such as: ? A procedure that creates an image of a blood vessel and nearby organs and provides information about blood flow through the blood vessel (duplex ultrasound). ? A procedure that tests blood flow (plethysmography). ? A procedure to look at the veins using X-ray and dye (venogram).  How is this treated? The goals of treatment are to help you return to an active life and to minimize pain or disability. Treatment depends on the severity of your condition, and it may include:  Wearing compression stockings. These can help relieve symptoms and help prevent your condition from getting worse. However, they do not cure the condition.  Sclerotherapy. This is a procedure involving an injection of a material that "dissolves" damaged veins.  Surgery. This may involve: ? Removing a diseased vein (vein stripping). ? Cutting off blood flow through the vein (laser ablation surgery). ? Repairing a valve.  Follow these instructions at home:  Wear compression stockings as told by your health care provider. These stockings help to prevent blood clots and reduce swelling in your legs.  Take over-the-counter and prescription medicines only as told by your health care provider.  Stay active by exercising, walking, or doing different activities. Ask your health care provider what activities are safe for you and how much exercise you need.  Drink enough fluid to keep your urine clear or pale  yellow.  Do not use any products that contain nicotine or tobacco, such as cigarettes and e-cigarettes. If you need help quitting, ask your health care provider.  Keep all follow-up visits as told by your health care provider. This is important. Contact a health care provider if:  You have redness, swelling, or more pain in the affected area.  You see a red streak or line that extends up or down from the affected area.  You have skin breakdown or a loss of skin in the affected area, even if the breakdown is small.  You get an injury in the affected area. Get help right away if:  You get an injury and an open wound in the affected area.  You have severe pain that does  not get better with medicine.  You have sudden numbness or weakness in the foot or ankle below the affected area, or you have trouble moving your foot or ankle.  You have a fever and you have worse or persistent symptoms.  You have chest pain.  You have shortness of breath. Summary  Chronic venous insufficiency, also called venous stasis, is a condition that prevents blood from being pumped effectively through the veins in your legs.  Chronic venous insufficiency occurs when the vein walls become stretched, weakened, or damaged, or when valves within the vein are damaged.  Treatment for this condition depends on how severe your condition is, and it may involve wearing compression stockings or having a procedure.  Make sure you stay active by exercising, walking, or doing different activities. Ask your health care provider what activities are safe for you and how much exercise you need. This information is not intended to replace advice given to you by your health care provider. Make sure you discuss any questions you have with your health care provider. Document Released: 10/29/2006 Document Revised: 05/14/2016 Document Reviewed: 05/14/2016 Elsevier Interactive Patient Education  2017 [employer].

## 2017-05-24 ENCOUNTER — Ambulatory Visit: Payer: Medicare Other | Admitting: Podiatry

## 2017-10-15 DIAGNOSIS — M810 Age-related osteoporosis without current pathological fracture: Secondary | ICD-10-CM | POA: Diagnosis not present

## 2017-10-15 DIAGNOSIS — E7800 Pure hypercholesterolemia, unspecified: Secondary | ICD-10-CM | POA: Diagnosis not present

## 2017-10-15 DIAGNOSIS — I10 Essential (primary) hypertension: Secondary | ICD-10-CM | POA: Diagnosis not present

## 2017-10-15 DIAGNOSIS — R569 Unspecified convulsions: Secondary | ICD-10-CM | POA: Diagnosis not present

## 2017-10-21 DIAGNOSIS — I739 Peripheral vascular disease, unspecified: Secondary | ICD-10-CM | POA: Diagnosis not present

## 2017-10-21 DIAGNOSIS — Z0000 Encounter for general adult medical examination without abnormal findings: Secondary | ICD-10-CM | POA: Diagnosis not present

## 2017-10-21 DIAGNOSIS — Z1389 Encounter for screening for other disorder: Secondary | ICD-10-CM | POA: Diagnosis not present

## 2017-10-21 DIAGNOSIS — E559 Vitamin D deficiency, unspecified: Secondary | ICD-10-CM | POA: Diagnosis not present

## 2017-10-21 DIAGNOSIS — E7800 Pure hypercholesterolemia, unspecified: Secondary | ICD-10-CM | POA: Diagnosis not present

## 2017-10-21 DIAGNOSIS — Z6821 Body mass index (BMI) 21.0-21.9, adult: Secondary | ICD-10-CM | POA: Diagnosis not present

## 2017-10-21 DIAGNOSIS — M810 Age-related osteoporosis without current pathological fracture: Secondary | ICD-10-CM | POA: Diagnosis not present

## 2017-10-21 DIAGNOSIS — G250 Essential tremor: Secondary | ICD-10-CM | POA: Diagnosis not present

## 2017-10-21 DIAGNOSIS — R32 Unspecified urinary incontinence: Secondary | ICD-10-CM | POA: Diagnosis not present

## 2017-10-21 DIAGNOSIS — I10 Essential (primary) hypertension: Secondary | ICD-10-CM | POA: Diagnosis not present

## 2017-10-21 DIAGNOSIS — R569 Unspecified convulsions: Secondary | ICD-10-CM | POA: Diagnosis not present

## 2017-10-21 DIAGNOSIS — I6529 Occlusion and stenosis of unspecified carotid artery: Secondary | ICD-10-CM | POA: Diagnosis not present

## 2017-11-08 ENCOUNTER — Encounter (HOSPITAL_COMMUNITY): Payer: PPO

## 2017-11-22 DIAGNOSIS — I739 Peripheral vascular disease, unspecified: Secondary | ICD-10-CM | POA: Diagnosis not present

## 2017-11-22 DIAGNOSIS — G5762 Lesion of plantar nerve, left lower limb: Secondary | ICD-10-CM | POA: Diagnosis not present

## 2017-11-22 DIAGNOSIS — L84 Corns and callosities: Secondary | ICD-10-CM | POA: Diagnosis not present

## 2017-11-22 DIAGNOSIS — M7752 Other enthesopathy of left foot: Secondary | ICD-10-CM | POA: Diagnosis not present

## 2017-11-22 DIAGNOSIS — M79672 Pain in left foot: Secondary | ICD-10-CM | POA: Diagnosis not present

## 2017-11-22 DIAGNOSIS — M79671 Pain in right foot: Secondary | ICD-10-CM | POA: Diagnosis not present

## 2017-11-22 DIAGNOSIS — L603 Nail dystrophy: Secondary | ICD-10-CM | POA: Diagnosis not present

## 2017-11-22 DIAGNOSIS — M2042 Other hammer toe(s) (acquired), left foot: Secondary | ICD-10-CM | POA: Diagnosis not present

## 2017-11-22 DIAGNOSIS — M2041 Other hammer toe(s) (acquired), right foot: Secondary | ICD-10-CM | POA: Diagnosis not present

## 2018-01-02 ENCOUNTER — Encounter: Payer: Self-pay | Admitting: Family

## 2018-01-02 ENCOUNTER — Ambulatory Visit (INDEPENDENT_AMBULATORY_CARE_PROVIDER_SITE_OTHER)
Admission: RE | Admit: 2018-01-02 | Discharge: 2018-01-02 | Disposition: A | Discharge status: Home / Self Care | Payer: PPO | Source: Ambulatory Visit | Attending: Vascular Surgery | Admitting: Vascular Surgery

## 2018-01-02 ENCOUNTER — Ambulatory Visit: Payer: PPO | Admitting: Family

## 2018-01-02 ENCOUNTER — Ambulatory Visit (HOSPITAL_COMMUNITY)
Admission: RE | Admit: 2018-01-02 | Discharge: 2018-01-02 | Disposition: A | Discharge status: Home / Self Care | Payer: PPO | Source: Ambulatory Visit | Attending: Vascular Surgery | Admitting: Vascular Surgery

## 2018-01-02 VITALS — BP 141/81 | Temp 96.6°F | Ht 76.0 in | Wt 166.4 lb

## 2018-01-02 DIAGNOSIS — I779 Disorder of arteries and arterioles, unspecified: Secondary | ICD-10-CM | POA: Diagnosis not present

## 2018-01-02 DIAGNOSIS — R9389 Abnormal findings on diagnostic imaging of other specified body structures: Secondary | ICD-10-CM | POA: Insufficient documentation

## 2018-01-02 DIAGNOSIS — I872 Venous insufficiency (chronic) (peripheral): Secondary | ICD-10-CM | POA: Diagnosis not present

## 2018-01-02 DIAGNOSIS — I6522 Occlusion and stenosis of left carotid artery: Secondary | ICD-10-CM | POA: Insufficient documentation

## 2018-01-02 DIAGNOSIS — R0989 Other specified symptoms and signs involving the circulatory and respiratory systems: Secondary | ICD-10-CM | POA: Diagnosis present

## 2018-01-02 DIAGNOSIS — I499 Cardiac arrhythmia, unspecified: Secondary | ICD-10-CM | POA: Diagnosis not present

## 2018-01-02 DIAGNOSIS — Z9889 Other specified postprocedural states: Secondary | ICD-10-CM

## 2018-01-02 DIAGNOSIS — Z87891 Personal history of nicotine dependence: Secondary | ICD-10-CM | POA: Insufficient documentation

## 2018-01-02 DIAGNOSIS — I739 Peripheral vascular disease, unspecified: Secondary | ICD-10-CM | POA: Insufficient documentation

## 2018-01-02 NOTE — Patient Instructions (Addendum)
To measure for knee high compression hose: Measure the length of calf (from the crease of the knee to the bottom of the heel), largest circumference of calf, and ankle circumference first thing in the morning before your legs have a chance to swell.  Take these 3 measurements with you to obtain 20-30 mm mercury graduated knee high compression hose.  Put the stockings on in the morning, remove at bedtime.     To decrease swelling in your feet and legs: Elevate feet above slightly bent knees, feet above heart, overnight and 3-4 times per day for 20 minutes.     Stroke Prevention Some health problems and behaviors may make it more likely for you to have a stroke. Below are ways to lessen your risk of having a stroke.  Be active for at least 30 minutes on most or all days.  Do not smoke. Try not to be around others who smoke.  Do not drink too much alcohol. ? Do not have more than 2 drinks a day if you are a man. ? Do not have more than 1 drink a day if you are a woman and are not pregnant.  Eat healthy foods, such as fruits and vegetables. If you were put on a specific diet, follow the diet as told.  Keep your cholesterol levels under control through diet and medicines. Look for foods that are low in saturated fat, trans fat, cholesterol, and are high in fiber.  If you have diabetes, follow all diet plans and take your medicine as told.  Ask your doctor if you need treatment to lower your blood pressure. If you have high blood pressure (hypertension), follow all diet plans and take your medicine as told by your doctor.  If you are 51-73 years old, have your blood pressure checked every 3-5 years. If you are age 40 or older, have your blood pressure checked every year.  Keep a healthy weight. Eat foods that are low in calories, salt, saturated fat, trans fat, and cholesterol.  Do not take drugs.  Avoid birth control pills, if this applies. Talk to your doctor about the risks of  taking birth control pills.  Talk to your doctor if you have sleep problems (sleep apnea).  Take all medicine as told by your doctor. ? You may be told to take aspirin or blood thinner medicine. Take this medicine as told by your doctor. ? Understand your medicine instructions.  Make sure any other conditions you have are being taken care of.  Get help right away if:  You suddenly lose feeling (you feel numb) or have weakness in your face, arm, or leg.  Your face or eyelid hangs down to one side.  You suddenly feel confused.  You have trouble talking (aphasia) or understanding what people are saying.  You suddenly have trouble seeing in one or both eyes.  You suddenly have trouble walking.  You are dizzy.  You lose your balance or your movements are clumsy (uncoordinated).  You suddenly have a very bad headache and you do not know the cause.  You have new chest pain.  Your heart feels like it is fluttering or skipping a beat (irregular heartbeat). Do not wait to see if the symptoms above go away. Get help right away. Call your local emergency services (911 in [place] Do not drive yourself to the hospital. This information is not intended to replace advice given to you by your health care provider. Make sure you discuss any  questions you have with your health care provider. Document Released: 12/25/2011 Document Revised: 12/01/2015 Document Reviewed: 12/26/2012 Elsevier Interactive Patient Education  2018 [employer].     Peripheral Vascular Disease Peripheral vascular disease (PVD) is a disease of the blood vessels that are not part of your heart and brain. A simple term for PVD is poor circulation. In most cases, PVD narrows the blood vessels that carry blood from your heart to the rest of your body. This can result in a decreased supply of blood to your arms, legs, and internal organs, like your stomach or kidneys. However, it most often affects a person's lower legs and  feet. There are two types of PVD.  Organic PVD. This is the more common type. It is caused by damage to the structure of blood vessels.  Functional PVD. This is caused by conditions that make blood vessels contract and tighten (spasm).  Without treatment, PVD tends to get worse over time. PVD can also lead to acute ischemic limb. This is when an arm or limb suddenly has trouble getting enough blood. This is a medical emergency. Follow these instructions at home:  Take medicines only as told by your doctor.  Do not use any tobacco products, including cigarettes, chewing tobacco, or electronic cigarettes. If you need help quitting, ask your doctor.  Lose weight if you are overweight, and maintain a healthy weight as told by your doctor.  Eat a diet that is low in fat and cholesterol. If you need help, ask your doctor.  Exercise regularly. Ask your doctor for some good activities for you.  Take good care of your feet. ? Wear comfortable shoes that fit well. ? Check your feet often for any cuts or sores. Contact a doctor if:  You have cramps in your legs while walking.  You have leg pain when you are at rest.  You have coldness in a leg or foot.  Your skin changes.  You are unable to get or have an erection (erectile dysfunction).  You have cuts or sores on your feet that are not healing. Get help right away if:  Your arm or leg turns cold and blue.  Your arms or legs become red, warm, swollen, painful, or numb.  You have chest pain or trouble breathing.  You suddenly have weakness in your face, arm, or leg.  You become very confused or you cannot speak.  You suddenly have a very bad headache.  You suddenly cannot see. This information is not intended to replace advice given to you by your health care provider. Make sure you discuss any questions you have with your health care provider. Document Released: 09/19/2009 Document Revised: 12/01/2015 Document Reviewed:  12/03/2013 Elsevier Interactive Patient Education  2017 [employer].

## 2018-01-02 NOTE — Progress Notes (Signed)
[facility] [place] HISTORY AND PHYSICAL   CC: Follow up extracranial carotid artery stenosis, PAD, and chronic venous insufficiency    History of Present Illness:   Richard Mitchell is a 77 y.o. male who is s/p left carotid endarterectomy in 2011 by Dr. Kellie Simmering for severe asymptomatic left ICA stenosis.  He also has PAD and chronic venous insufficiency. He returns today for routine follow up.  He does have a history of seizures and has a remote history of a right subdural hematoma in 1998 following a fall. 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 He walks with a cane since his head injury, had to relearn how to walk. He does not seem to have claudication symptoms with walking.   He has not had embolic CVA per wife and pt that they are aware. 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  There is no more weeping of serous fluid from the lower legs, and less pitting and non pitting edema in both ankles. He does have moderate ruddy color in both feet and scaling of skin.   He denies hemiparesis, denies aphasia.  He has a bony prominence on the plantar surface of his left first metatarsus, skin has trouble healing over this, is seeing a podiatrist.    Diabetic: No Tobacco use: former smoker, quit in 1989  Pt meds include: Statin : Yes ASA: Yes Other anticoagulants/antiplatelets: no     Current Outpatient Medications  Medication Sig Dispense Refill  . aspirin 81 MG tablet Take 81 mg by mouth daily.    . carbamazepine (TEGRETOL) 200 MG tablet Take 200 mg by mouth 2 (two) times daily.    . rosuvastatin (CRESTOR) 20 MG tablet TK 1 T PO D  3  . simvastatin (ZOCOR) 40 MG tablet Take 40  mg by mouth every evening.    . Vitamin D, Ergocalciferol, (DRISDOL) 50000 UNITS CAPS Take 50,000 Units by mouth every 7 (seven) days.    Marland Kitchen losartan (COZAAR) 50 MG tablet Take 50 mg by mouth daily.    Marland Kitchen losartan-hydrochlorothiazide (HYZAAR) 50-12.5 MG per tablet TK 1 T PO  QD  0  . omeprazole (PRILOSEC) 40 MG capsule daily.  4  . terbinafine (LAMISIL) 250 MG tablet Take 250 mg by mouth daily. Reported on 12/28/2015     No current facility-administered medications for this visit.     Past Medical History:  Diagnosis Date  . Anemia   . Cancer ([place])    [place]  . Carotid artery occlusion   . Dysphasia S/P CVA (cerebrovascular accident)   . Gait disturbance 11/12/2012  . History of subdural hematoma (post traumatic) Approx 1998  . Hypertension   . Keratosis 11/12/2012   x2  . Onychomycosis 11/12/2012   extreme neglect x 10  . Osteoporosis   . PAD (peripheral artery disease) ([place]) 11/12/2012   possible due to diminished pedal pulses  . Seizure disorder [facility])     Social History Social History   Tobacco Use  . Smoking status: Former Smoker    Last attempt to quit: 11/19/1987    Years since quitting: 30.1  . Smokeless tobacco: Never Used  Substance Use  Topics  . Alcohol use: No    Alcohol/week: 0.0 oz  . Drug use: No    Family History Family History  Problem Relation Age of Onset  . Heart disease Mother        had pacemaker  . Cancer Mother        Breast  . Heart disease Sister   . Cancer Sister        Pancreatic  . Deep vein thrombosis Sister   . Heart attack Sister   . COPD Father     Surgical History Past Surgical History:  Procedure Laterality Date  . CAROTID ENDARTERECTOMY Left 10-13-2009  . SUBDURAL HEMATOMA EVACUATION VIA CRANIOTOMY      No Known Allergies  Current Outpatient Medications  Medication Sig Dispense Refill  . aspirin 81 MG tablet Take 81 mg by mouth daily.    . carbamazepine (TEGRETOL) 200 MG tablet Take 200 mg by mouth 2 (two) times daily.     . rosuvastatin (CRESTOR) 20 MG tablet TK 1 T PO D  3  . simvastatin (ZOCOR) 40 MG tablet Take 40 mg by mouth every evening.    . Vitamin D, Ergocalciferol, (DRISDOL) 50000 UNITS CAPS Take 50,000 Units by mouth every 7 (seven) days.    Marland Kitchen losartan (COZAAR) 50 MG tablet Take 50 mg by mouth daily.    Marland Kitchen losartan-hydrochlorothiazide (HYZAAR) 50-12.5 MG per tablet TK 1 T PO  QD  0  . omeprazole (PRILOSEC) 40 MG capsule daily.  4  . terbinafine (LAMISIL) 250 MG tablet Take 250 mg by mouth daily. Reported on 12/28/2015     No current facility-administered medications for this visit.      REVIEW OF SYSTEMS: See HPI for pertinent positives and negatives.  Physical Examination Vitals:   01/02/18 1401 01/02/18 1407  BP: 130/77 (!) 141/81  Temp: (!) 96.6 F (35.9 C)   TempSrc: Oral   Weight: 166 lb 6.4 oz (75.5 kg)   Height: 6\' 4"  (1.93 m)    Body mass index is 20.25 kg/m.  General:  WDWN male in NAD Gait: using cane, slow and deliberate.  HENT: Soft area at left side of head where part of his skull was surgically removed.  Occasional drooling.  Eyes: Pupils equal Pulmonary: normal non-labored breathing, good air movement, CTAB, without rales, rhonchi, or wheezing Cardiac: Regular rhythm with transient irregular rhythm, no murmur detected Abdomen: soft, NT, no masses palpated Skin: no rashes, no ulcers, no cellulitis.  Dry flaky skin of feet and legs.    VASCULAR EXAM  Carotid Bruits Right Left   Negative Negative      Radial pulses are 2+ right, not palpable left, left brachial is 2+ palpable  Adominal aortic pulse is not palpable                      VASCULAR EXAM: Extremities without ischemic changes, without Gangrene; without open wounds. Hammer toes bilaterally. Feet are ruddy. Hands are ruddy and cool.  LE Pulses Right Left       FEMORAL  2+ palpable  2+ palpable         POPLITEAL  not palpable   not palpable       POSTERIOR TIBIAL  not palpable   not palpable        DORSALIS PEDIS      ANTERIOR TIBIAL not palpable  not palpable     Musculoskeletal: no muscle wasting or atrophy; no peripheral edema  Neurologic:  A&O X 3; appropriate affect, sensation is normal; speech is normal, CN 2-12 intact except mild hearing loss, pain and light touch intact in extremities, motor exam as listed above. Psychiatric: Normal thought content, mood appropriate to clinical situation.     ASSESSMENT:  Richard Mitchell is a 77 y.o. male who is s/p left carotid endarterectomy in 2011 for severe asymptomatic left ICA stenosis.  He also has PAD and chronic venous insufficiency.  He has a remote history of a right subdural hematoma in 1998 following a fall. He has no known history of embolic stroke or TIA.  He does not seem to walk enough to elicit claudication symptoms. He has no ulcers in his feet/legs, no gangrene. The dependent edema in his feet and lower legs improve the more he elevates his legs.  He has a bony prominence on the plantar surface of his left first metatarsus, skin has trouble healing over this, is seeing a podiatrist.    His hands and feet are cool and ruddy with the appearance of Raynaud's Syndrome.    DATA  Carotid Duplex (01-02-18):  Right ICA: 40-59% stenosis Left ICA: CEA site with no stenosis Right ECA: >50% stenosis Bilateral vertebral artery flow is antegrade.  Bilateral subclavian artery waveforms are normal.  No change compared to exams on 03/08/2015, 12/28/2015, and 01-01-17.     ABI (Date: 01/02/2018):  R:   ABI: [place] (was [place] on 01-01-17),   PT: waveform morphology not documented (was mono)  DP: waveform morphology not documented (was tri)  TBI:  0.53 (was 0.42)  L:   ABI: [place] (was [place]),   PT: absent  DP: mono   Pero: biphasic  TBI: 0.35 (was 0.42)  Bilateral ABI are not reliable due to non compressible  vessels. Improved right TBI, decline in left TBI.    PLAN:   Wife requested referral to cardiologist, instead of asking his PCP, since he just saw his PCP 2 months ago, and sees him yearly, for evaluation of transient cardiac arhythmia.   Daily seated leg exercises as discussed an demonstrated. Elevate feet when not walking. Knee high compression hose during the day.  Based on today's exam and non-invasive vascular lab results, the patient will follow up in 1 year with the following tests: ABI's, carotid duplex.     I discussed in depth with the patient the nature of atherosclerosis, and emphasized the importance of maximal medical management including strict control of blood pressure, blood glucose, and lipid levels, obtaining regular exercise, and cessation of smoking.  The patient is aware that without maximal medical management the underlying atherosclerotic disease process will progress, limiting the benefit of any interventions.  The patient was given information about stroke prevention and what symptoms should prompt the patient to seek immediate medical care.  The patient was given information about PAD including signs, symptoms, treatment, what symptoms should prompt the patient to seek immediate medical care, and risk reduction measures to take.  Thank you for allowing [place] to  participate in this patient's care.  Clemon Chambers, RN, MSN, FNP-C [facility] Office: [phone]  Clinic MD: [facility] 01/02/2018 2:43 PM

## 2018-01-21 ENCOUNTER — Encounter: Payer: Self-pay | Admitting: Cardiovascular Disease

## 2018-01-21 ENCOUNTER — Ambulatory Visit: Payer: PPO | Admitting: Cardiovascular Disease

## 2018-01-21 DIAGNOSIS — I10 Essential (primary) hypertension: Secondary | ICD-10-CM | POA: Diagnosis not present

## 2018-01-21 DIAGNOSIS — E7800 Pure hypercholesterolemia, unspecified: Secondary | ICD-10-CM

## 2018-01-21 DIAGNOSIS — E785 Hyperlipidemia, unspecified: Secondary | ICD-10-CM | POA: Insufficient documentation

## 2018-01-21 NOTE — Patient Instructions (Signed)
Medication Instructions:  Your physician recommends that you continue on your current medications as directed. Please refer to the Current Medication list given to you today.   Labwork: none  Testing/Procedures: none  Follow-Up: Follow up with Dr. Berry as needed.   Any Other Special Instructions Will Be Listed Below (If Applicable).     If you need a refill on your cardiac medications before your next appointment, please call your pharmacy.   

## 2018-01-21 NOTE — Assessment & Plan Note (Signed)
History of essential hypertension blood pressure measured today at 104/52.  He is on Benicar hydrochlorothiazide.  Continue current meds at current dosing.

## 2018-01-21 NOTE — Progress Notes (Signed)
01/21/2018 Richard Mitchell   1940-09-15  [id number]  Primary Physician Marton Redwood, MD Primary Cardiologist: Lorretta Harp MD Lupe Carney, [place]  HPI:  Richard Mitchell is a 77 y.o. thin and frail appearing married Caucasian male father of one daughter me by his wife Richard Mitchell today.  I apparently performed a cardiac catheterization on Richard Mitchell 25 years ago.  He was referred to me for cardiovascular evaluation because of multiple risk factors.  He does have a history of treated hypertension and hyperlipidemia.  He is never had a heart attack or stroke.  He has had uncomplicated left carotid endarterectomy performed by Dr. Kellie Simmering 10/13/2009 followed by Dr. Donnetta Hutching by duplex ultrasound.  He denies chest pain or shortness of breath.   Current Meds  Medication Sig  . aspirin 81 MG tablet Take 81 mg by mouth daily.  . carbamazepine (TEGRETOL) 200 MG tablet Take 400 mg by mouth 2 (two) times daily.   Marland Kitchen olmesartan-hydrochlorothiazide (BENICAR HCT) 20-12.5 MG tablet Take 1 tablet by mouth every morning.  . rosuvastatin (CRESTOR) 20 MG tablet TAKE 20 MG ONCE DAILY.  Marland Kitchen Vitamin D, Ergocalciferol, (DRISDOL) 50000 UNITS CAPS Take 50,000 Units by mouth every 7 (seven) days.     No Known Allergies  Social History   Socioeconomic History  . Marital status: Married    Spouse name: Not on file  . Number of children: Not on file  . Years of education: Not on file  . Highest education level: Not on file  Occupational History  . Not on file  Social Needs  . Financial resource strain: Not on file  . Food insecurity:    Worry: Not on file    Inability: Not on file  . Transportation needs:    Medical: Not on file    Non-medical: Not on file  Tobacco Use  . Smoking status: Former Smoker    Last attempt to quit: 11/19/1987    Years since quitting: 30.1  . Smokeless tobacco: Never Used  Substance and Sexual Activity  . Alcohol use: No    Alcohol/week: 0.0 oz  . Drug use: No  . Sexual  activity: Not on file  Lifestyle  . Physical activity:    Days per week: Not on file    Minutes per session: Not on file  . Stress: Not on file  Relationships  . Social connections:    Talks on phone: Not on file    Gets together: Not on file    Attends religious service: Not on file    Active member of club or organization: Not on file    Attends meetings of clubs or organizations: Not on file    Relationship status: Not on file  . Intimate partner violence:    Fear of current or ex partner: Not on file    Emotionally abused: Not on file    Physically abused: Not on file    Forced sexual activity: Not on file  Other Topics Concern  . Not on file  Social History Narrative  . Not on file     Review of Systems: General: negative for chills, fever, night sweats or weight changes.  Cardiovascular: negative for chest pain, dyspnea on exertion, edema, orthopnea, palpitations, paroxysmal nocturnal dyspnea or shortness of breath Dermatological: negative for rash Respiratory: negative for cough or wheezing Urologic: negative for hematuria Abdominal: negative for nausea, vomiting, diarrhea, bright red blood per rectum, melena, or hematemesis Neurologic: negative for visual changes,  syncope, or dizziness All other systems reviewed and are otherwise negative except as noted above.    Blood pressure (!) 104/52, pulse (!) 56, height 6\' 4"  (1.93 m).  General appearance: alert and no distress Neck: no adenopathy, no JVD, supple, symmetrical, trachea midline, thyroid not enlarged, symmetric, no tenderness/mass/nodules and Bilateral carotid bruits left louder than right Lungs: clear to auscultation bilaterally Heart: regular rate and rhythm, S1, S2 normal, no murmur, click, rub or gallop Extremities: extremities normal, atraumatic, no cyanosis or edema Pulses: 2+ and symmetric Skin: Skin color, texture, turgor normal. No rashes or lesions Neurologic: Alert and oriented X 3, normal strength  and tone. Normal symmetric reflexes. Normal coordination and gait  EKG sinus bradycardia 56 with nonspecific ST and T wave changes.  I personally reviewed this EKG.  ASSESSMENT AND PLAN:   Essential hypertension History of essential hypertension blood pressure measured today at 104/52.  He is on Benicar hydrochlorothiazide.  Continue current meds at current dosing.  Hyperlipidemia History of hyperlipidemia on statin therapy.      Lorretta Harp MD FACP,FACC,FAHA, [facility] 01/21/2018 11:16 AM

## 2018-01-21 NOTE — Assessment & Plan Note (Signed)
History of hyperlipidemia on statin therapy. 

## 2018-01-22 NOTE — Addendum Note (Signed)
Addended by: Venetia Maxon on: 01/22/2018 04:48 PM   Modules accepted: Orders

## 2018-04-22 ENCOUNTER — Ambulatory Visit: Payer: BLUE CROSS/BLUE SHIELD | Admitting: Podiatry

## 2018-10-23 DIAGNOSIS — M810 Age-related osteoporosis without current pathological fracture: Secondary | ICD-10-CM | POA: Diagnosis not present

## 2018-10-23 DIAGNOSIS — R569 Unspecified convulsions: Secondary | ICD-10-CM | POA: Diagnosis not present

## 2018-10-23 DIAGNOSIS — E7849 Other hyperlipidemia: Secondary | ICD-10-CM | POA: Diagnosis not present

## 2018-10-23 DIAGNOSIS — I10 Essential (primary) hypertension: Secondary | ICD-10-CM | POA: Diagnosis not present

## 2018-10-30 DIAGNOSIS — I10 Essential (primary) hypertension: Secondary | ICD-10-CM | POA: Diagnosis not present

## 2018-10-30 DIAGNOSIS — R7989 Other specified abnormal findings of blood chemistry: Secondary | ICD-10-CM | POA: Diagnosis not present

## 2018-10-30 DIAGNOSIS — I6529 Occlusion and stenosis of unspecified carotid artery: Secondary | ICD-10-CM | POA: Diagnosis not present

## 2018-10-30 DIAGNOSIS — E559 Vitamin D deficiency, unspecified: Secondary | ICD-10-CM | POA: Diagnosis not present

## 2018-10-30 DIAGNOSIS — I739 Peripheral vascular disease, unspecified: Secondary | ICD-10-CM | POA: Diagnosis not present

## 2018-10-30 DIAGNOSIS — R1310 Dysphagia, unspecified: Secondary | ICD-10-CM | POA: Diagnosis not present

## 2018-10-30 DIAGNOSIS — R05 Cough: Secondary | ICD-10-CM | POA: Diagnosis not present

## 2018-10-30 DIAGNOSIS — Z0000 Encounter for general adult medical examination without abnormal findings: Secondary | ICD-10-CM | POA: Diagnosis not present

## 2018-10-30 DIAGNOSIS — M810 Age-related osteoporosis without current pathological fracture: Secondary | ICD-10-CM | POA: Diagnosis not present

## 2018-10-30 DIAGNOSIS — E785 Hyperlipidemia, unspecified: Secondary | ICD-10-CM | POA: Diagnosis not present

## 2018-10-30 DIAGNOSIS — R634 Abnormal weight loss: Secondary | ICD-10-CM | POA: Diagnosis not present

## 2018-10-30 DIAGNOSIS — Z1331 Encounter for screening for depression: Secondary | ICD-10-CM | POA: Diagnosis not present

## 2018-10-30 DIAGNOSIS — D649 Anemia, unspecified: Secondary | ICD-10-CM | POA: Diagnosis not present

## 2018-11-06 DIAGNOSIS — R05 Cough: Secondary | ICD-10-CM | POA: Diagnosis not present

## 2018-11-06 DIAGNOSIS — R634 Abnormal weight loss: Secondary | ICD-10-CM | POA: Diagnosis not present

## 2018-11-07 ENCOUNTER — Encounter (HOSPITAL_COMMUNITY): Payer: PPO

## 2018-11-17 ENCOUNTER — Other Ambulatory Visit (HOSPITAL_COMMUNITY): Payer: Self-pay

## 2018-11-17 DIAGNOSIS — R1310 Dysphagia, unspecified: Secondary | ICD-10-CM

## 2018-12-02 ENCOUNTER — Ambulatory Visit (HOSPITAL_COMMUNITY)
Admission: RE | Admit: 2018-12-02 | Discharge: 2018-12-02 | Disposition: A | Discharge status: Home / Self Care | Payer: PPO | Source: Ambulatory Visit | Attending: Internal Medicine | Admitting: Internal Medicine

## 2018-12-02 ENCOUNTER — Other Ambulatory Visit: Payer: Self-pay

## 2018-12-02 DIAGNOSIS — R1310 Dysphagia, unspecified: Secondary | ICD-10-CM | POA: Insufficient documentation

## 2018-12-04 DIAGNOSIS — J690 Pneumonitis due to inhalation of food and vomit: Secondary | ICD-10-CM | POA: Diagnosis not present

## 2018-12-12 DIAGNOSIS — R1310 Dysphagia, unspecified: Secondary | ICD-10-CM | POA: Diagnosis not present

## 2018-12-12 DIAGNOSIS — T17800A Unspecified foreign body in other parts of respiratory tract causing asphyxiation, initial encounter: Secondary | ICD-10-CM | POA: Diagnosis not present

## 2018-12-18 DIAGNOSIS — I10 Essential (primary) hypertension: Secondary | ICD-10-CM | POA: Diagnosis not present

## 2018-12-18 DIAGNOSIS — M810 Age-related osteoporosis without current pathological fracture: Secondary | ICD-10-CM | POA: Diagnosis not present

## 2018-12-18 DIAGNOSIS — D649 Anemia, unspecified: Secondary | ICD-10-CM | POA: Diagnosis not present

## 2018-12-18 DIAGNOSIS — R1310 Dysphagia, unspecified: Secondary | ICD-10-CM | POA: Diagnosis not present

## 2018-12-18 DIAGNOSIS — J690 Pneumonitis due to inhalation of food and vomit: Secondary | ICD-10-CM | POA: Diagnosis not present

## 2018-12-19 DIAGNOSIS — J690 Pneumonitis due to inhalation of food and vomit: Secondary | ICD-10-CM | POA: Diagnosis not present

## 2018-12-19 DIAGNOSIS — M810 Age-related osteoporosis without current pathological fracture: Secondary | ICD-10-CM | POA: Diagnosis not present

## 2019-01-01 ENCOUNTER — Encounter (HOSPITAL_COMMUNITY): Payer: PPO

## 2019-01-06 ENCOUNTER — Ambulatory Visit: Payer: PPO | Admitting: Speech Pathology

## 2019-05-15 DIAGNOSIS — I10 Essential (primary) hypertension: Secondary | ICD-10-CM | POA: Diagnosis not present

## 2019-05-15 DIAGNOSIS — R1312 Dysphagia, oropharyngeal phase: Secondary | ICD-10-CM | POA: Diagnosis not present

## 2019-05-15 DIAGNOSIS — J690 Pneumonitis due to inhalation of food and vomit: Secondary | ICD-10-CM | POA: Diagnosis not present

## 2019-05-15 DIAGNOSIS — M810 Age-related osteoporosis without current pathological fracture: Secondary | ICD-10-CM | POA: Diagnosis not present

## 2019-05-18 DIAGNOSIS — H6123 Impacted cerumen, bilateral: Secondary | ICD-10-CM | POA: Diagnosis not present

## 2019-05-18 DIAGNOSIS — H938X3 Other specified disorders of ear, bilateral: Secondary | ICD-10-CM | POA: Diagnosis not present

## 2019-05-18 DIAGNOSIS — Z87891 Personal history of nicotine dependence: Secondary | ICD-10-CM | POA: Diagnosis not present

## 2019-05-18 DIAGNOSIS — R1310 Dysphagia, unspecified: Secondary | ICD-10-CM | POA: Diagnosis not present

## 2019-05-21 DIAGNOSIS — M810 Age-related osteoporosis without current pathological fracture: Secondary | ICD-10-CM | POA: Diagnosis not present

## 2019-05-21 DIAGNOSIS — E559 Vitamin D deficiency, unspecified: Secondary | ICD-10-CM | POA: Diagnosis not present

## 2019-05-21 DIAGNOSIS — I10 Essential (primary) hypertension: Secondary | ICD-10-CM | POA: Diagnosis not present

## 2019-06-11 ENCOUNTER — Other Ambulatory Visit (HOSPITAL_COMMUNITY): Payer: Self-pay | Admitting: *Deleted

## 2019-06-12 ENCOUNTER — Other Ambulatory Visit: Payer: Self-pay

## 2019-06-12 ENCOUNTER — Ambulatory Visit (HOSPITAL_COMMUNITY)
Admission: RE | Admit: 2019-06-12 | Discharge: 2019-06-12 | Disposition: A | Discharge status: Home / Self Care | Payer: PPO | Source: Ambulatory Visit | Attending: Internal Medicine | Admitting: Internal Medicine

## 2019-06-12 DIAGNOSIS — M810 Age-related osteoporosis without current pathological fracture: Secondary | ICD-10-CM | POA: Insufficient documentation

## 2019-06-12 MED ORDER — ZOLEDRONIC ACID 5 MG/100ML IV SOLN
5.0000 mg | Freq: Once | INTRAVENOUS | Status: AC
  Administered 2019-06-12: 5 mg via INTRAVENOUS

## 2019-06-12 MED ORDER — ZOLEDRONIC ACID 5 MG/100ML IV SOLN
INTRAVENOUS | Status: AC
  Filled 2019-06-12: qty 100

## 2019-06-12 NOTE — Discharge Instructions (Signed)

## 2019-08-04 ENCOUNTER — Other Ambulatory Visit: Payer: Self-pay

## 2019-08-04 ENCOUNTER — Emergency Department (HOSPITAL_COMMUNITY): Payer: PPO

## 2019-08-04 ENCOUNTER — Encounter (HOSPITAL_COMMUNITY): Payer: Self-pay | Admitting: Emergency Medicine

## 2019-08-04 ENCOUNTER — Inpatient Hospital Stay (HOSPITAL_COMMUNITY)
Admission: EM | Admit: 2019-08-04 | Discharge: 2019-08-10 | DRG: 177 | Disposition: A | Discharge status: Skilled Nursing Facility | Payer: PPO | Attending: Internal Medicine | Admitting: Internal Medicine

## 2019-08-04 DIAGNOSIS — I480 Paroxysmal atrial fibrillation: Secondary | ICD-10-CM

## 2019-08-04 DIAGNOSIS — Z825 Family history of asthma and other chronic lower respiratory diseases: Secondary | ICD-10-CM

## 2019-08-04 DIAGNOSIS — I69321 Dysphasia following cerebral infarction: Secondary | ICD-10-CM | POA: Diagnosis not present

## 2019-08-04 DIAGNOSIS — Z66 Do not resuscitate: Secondary | ICD-10-CM

## 2019-08-04 DIAGNOSIS — I441 Atrioventricular block, second degree: Secondary | ICD-10-CM | POA: Diagnosis not present

## 2019-08-04 DIAGNOSIS — R0902 Hypoxemia: Secondary | ICD-10-CM | POA: Diagnosis not present

## 2019-08-04 DIAGNOSIS — M6281 Muscle weakness (generalized): Secondary | ICD-10-CM | POA: Diagnosis not present

## 2019-08-04 DIAGNOSIS — R627 Adult failure to thrive: Secondary | ICD-10-CM | POA: Diagnosis not present

## 2019-08-04 DIAGNOSIS — Z515 Encounter for palliative care: Secondary | ICD-10-CM

## 2019-08-04 DIAGNOSIS — R1312 Dysphagia, oropharyngeal phase: Secondary | ICD-10-CM | POA: Diagnosis not present

## 2019-08-04 DIAGNOSIS — R001 Bradycardia, unspecified: Secondary | ICD-10-CM | POA: Diagnosis not present

## 2019-08-04 DIAGNOSIS — M810 Age-related osteoporosis without current pathological fracture: Secondary | ICD-10-CM | POA: Diagnosis not present

## 2019-08-04 DIAGNOSIS — S329XXA Fracture of unspecified parts of lumbosacral spine and pelvis, initial encounter for closed fracture: Secondary | ICD-10-CM | POA: Diagnosis present

## 2019-08-04 DIAGNOSIS — I959 Hypotension, unspecified: Secondary | ICD-10-CM | POA: Diagnosis not present

## 2019-08-04 DIAGNOSIS — Z20822 Contact with and (suspected) exposure to covid-19: Secondary | ICD-10-CM | POA: Diagnosis present

## 2019-08-04 DIAGNOSIS — D649 Anemia, unspecified: Secondary | ICD-10-CM | POA: Diagnosis present

## 2019-08-04 DIAGNOSIS — J690 Pneumonitis due to inhalation of food and vomit: Principal | ICD-10-CM | POA: Diagnosis present

## 2019-08-04 DIAGNOSIS — I10 Essential (primary) hypertension: Secondary | ICD-10-CM | POA: Diagnosis not present

## 2019-08-04 DIAGNOSIS — S299XXA Unspecified injury of thorax, initial encounter: Secondary | ICD-10-CM | POA: Diagnosis not present

## 2019-08-04 DIAGNOSIS — R8290 Unspecified abnormal findings in urine: Secondary | ICD-10-CM | POA: Diagnosis present

## 2019-08-04 DIAGNOSIS — R102 Pelvic and perineal pain: Secondary | ICD-10-CM | POA: Diagnosis not present

## 2019-08-04 DIAGNOSIS — I484 Atypical atrial flutter: Secondary | ICD-10-CM | POA: Diagnosis present

## 2019-08-04 DIAGNOSIS — E785 Hyperlipidemia, unspecified: Secondary | ICD-10-CM | POA: Diagnosis present

## 2019-08-04 DIAGNOSIS — J9601 Acute respiratory failure with hypoxia: Secondary | ICD-10-CM | POA: Diagnosis present

## 2019-08-04 DIAGNOSIS — S3993XA Unspecified injury of pelvis, initial encounter: Secondary | ICD-10-CM | POA: Diagnosis not present

## 2019-08-04 DIAGNOSIS — Z809 Family history of malignant neoplasm, unspecified: Secondary | ICD-10-CM | POA: Diagnosis not present

## 2019-08-04 DIAGNOSIS — Z79899 Other long term (current) drug therapy: Secondary | ICD-10-CM

## 2019-08-04 DIAGNOSIS — I69391 Dysphagia following cerebral infarction: Secondary | ICD-10-CM

## 2019-08-04 DIAGNOSIS — R1310 Dysphagia, unspecified: Secondary | ICD-10-CM

## 2019-08-04 DIAGNOSIS — R531 Weakness: Secondary | ICD-10-CM

## 2019-08-04 DIAGNOSIS — W19XXXA Unspecified fall, initial encounter: Secondary | ICD-10-CM | POA: Diagnosis present

## 2019-08-04 DIAGNOSIS — Z8249 Family history of ischemic heart disease and other diseases of the circulatory system: Secondary | ICD-10-CM

## 2019-08-04 DIAGNOSIS — Z7982 Long term (current) use of aspirin: Secondary | ICD-10-CM

## 2019-08-04 DIAGNOSIS — I4892 Unspecified atrial flutter: Secondary | ICD-10-CM | POA: Diagnosis not present

## 2019-08-04 DIAGNOSIS — G40909 Epilepsy, unspecified, not intractable, without status epilepticus: Secondary | ICD-10-CM | POA: Diagnosis present

## 2019-08-04 DIAGNOSIS — Z87891 Personal history of nicotine dependence: Secondary | ICD-10-CM

## 2019-08-04 DIAGNOSIS — R296 Repeated falls: Secondary | ICD-10-CM | POA: Diagnosis present

## 2019-08-04 DIAGNOSIS — I4891 Unspecified atrial fibrillation: Secondary | ICD-10-CM | POA: Diagnosis not present

## 2019-08-04 DIAGNOSIS — G4089 Other seizures: Secondary | ICD-10-CM | POA: Diagnosis not present

## 2019-08-04 DIAGNOSIS — I739 Peripheral vascular disease, unspecified: Secondary | ICD-10-CM | POA: Diagnosis present

## 2019-08-04 DIAGNOSIS — Z85828 Personal history of other malignant neoplasm of skin: Secondary | ICD-10-CM

## 2019-08-04 DIAGNOSIS — I69891 Dysphagia following other cerebrovascular disease: Secondary | ICD-10-CM | POA: Diagnosis not present

## 2019-08-04 DIAGNOSIS — R079 Chest pain, unspecified: Secondary | ICD-10-CM | POA: Diagnosis not present

## 2019-08-04 DIAGNOSIS — S199XXA Unspecified injury of neck, initial encounter: Secondary | ICD-10-CM | POA: Diagnosis not present

## 2019-08-04 LAB — CBC WITH DIFFERENTIAL/PLATELET
Abs Immature Granulocytes: 0.07 10*3/uL (ref 0.00–0.07)
Basophils Absolute: 0 10*3/uL (ref 0.0–0.1)
Basophils Relative: 0 %
Eosinophils Absolute: 0.2 10*3/uL (ref 0.0–0.5)
Eosinophils Relative: 1 %
HCT: 34.3 % — ABNORMAL LOW (ref 39.0–52.0)
Hemoglobin: 10.8 g/dL — ABNORMAL LOW (ref 13.0–17.0)
Immature Granulocytes: 1 %
Lymphocytes Relative: 7 %
Lymphs Abs: 0.9 10*3/uL (ref 0.7–4.0)
MCH: 31.3 pg (ref 26.0–34.0)
MCHC: 31.5 g/dL (ref 30.0–36.0)
MCV: 99.4 fL (ref 80.0–100.0)
Monocytes Absolute: 1.1 10*3/uL — ABNORMAL HIGH (ref 0.1–1.0)
Monocytes Relative: 8 %
Neutro Abs: 10.7 10*3/uL — ABNORMAL HIGH (ref 1.7–7.7)
Neutrophils Relative %: 83 %
Platelets: 199 10*3/uL (ref 150–400)
RBC: 3.45 MIL/uL — ABNORMAL LOW (ref 4.22–5.81)
RDW: 14.1 % (ref 11.5–15.5)
WBC: 13 10*3/uL — ABNORMAL HIGH (ref 4.0–10.5)
nRBC: 0 % (ref 0.0–0.2)

## 2019-08-04 LAB — TROPONIN I (HIGH SENSITIVITY)
Troponin I (High Sensitivity): 5 ng/L (ref <18)
Troponin I (High Sensitivity): 5 ng/L (ref <18)

## 2019-08-04 LAB — URINALYSIS, ROUTINE W REFLEX MICROSCOPIC
Bilirubin Urine: NEGATIVE
Glucose, UA: NEGATIVE mg/dL
Hgb urine dipstick: NEGATIVE
Ketones, ur: NEGATIVE mg/dL
Nitrite: NEGATIVE
Protein, ur: NEGATIVE mg/dL
Specific Gravity, Urine: 1.019 (ref 1.005–1.030)
pH: 7 (ref 5.0–8.0)

## 2019-08-04 LAB — COMPREHENSIVE METABOLIC PANEL
ALT: 15 U/L (ref 0–44)
AST: 17 U/L (ref 15–41)
Albumin: 3.8 g/dL (ref 3.5–5.0)
Alkaline Phosphatase: 66 U/L (ref 38–126)
Anion gap: 10 (ref 5–15)
BUN: 33 mg/dL — ABNORMAL HIGH (ref 8–23)
CO2: 23 mmol/L (ref 22–32)
Calcium: 8.9 mg/dL (ref 8.9–10.3)
Chloride: 102 mmol/L (ref 98–111)
Creatinine, Ser: 1.17 mg/dL (ref 0.61–1.24)
GFR calc Af Amer: >60 mL/min (ref >60)
GFR calc non Af Amer: 59 mL/min — ABNORMAL LOW (ref >60)
Glucose, Bld: 118 mg/dL — ABNORMAL HIGH (ref 70–99)
Potassium: 4.9 mmol/L (ref 3.5–5.1)
Sodium: 135 mmol/L (ref 135–145)
Total Bilirubin: 0.4 mg/dL (ref 0.3–1.2)
Total Protein: 6.8 g/dL (ref 6.5–8.1)

## 2019-08-04 LAB — RESPIRATORY PANEL BY RT PCR (FLU A&B, COVID)
Influenza A by PCR: NEGATIVE
Influenza B by PCR: NEGATIVE
SARS Coronavirus 2 by RT PCR: NEGATIVE

## 2019-08-04 LAB — LACTIC ACID, PLASMA
Lactic Acid, Venous: 0.7 mmol/L (ref 0.5–1.9)
Lactic Acid, Venous: 0.9 mmol/L (ref 0.5–1.9)

## 2019-08-04 MED ORDER — SODIUM CHLORIDE 0.9 % IV BOLUS
1000.0000 mL | Freq: Once | INTRAVENOUS | Status: AC
  Administered 2019-08-04: 22:00:00 1000 mL via INTRAVENOUS

## 2019-08-04 MED ORDER — HYDRALAZINE HCL 20 MG/ML IJ SOLN: 5.0000 mg | INTRAMUSCULAR | Status: DC | PRN

## 2019-08-04 MED ORDER — SODIUM CHLORIDE 0.9 % IV SOLN
3.0000 g | Freq: Four times a day (QID) | INTRAVENOUS | Status: DC
  Administered 2019-08-05 – 2019-08-10 (×22): 3 g via INTRAVENOUS
  Filled 2019-08-04: qty 3
  Filled 2019-08-04: qty 8
  Filled 2019-08-04 (×2): qty 3
  Filled 2019-08-04: qty 8
  Filled 2019-08-04 (×2): qty 3
  Filled 2019-08-04: qty 8
  Filled 2019-08-04: qty 3
  Filled 2019-08-04: qty 8
  Filled 2019-08-04 (×7): qty 3
  Filled 2019-08-04 (×2): qty 8
  Filled 2019-08-04 (×6): qty 3

## 2019-08-04 MED ORDER — ACETAMINOPHEN 325 MG PO TABS
650.0000 mg | ORAL_TABLET | Freq: Four times a day (QID) | ORAL | Status: DC | PRN
  Administered 2019-08-06: 08:00:00 650 mg via ORAL
  Filled 2019-08-04: qty 2

## 2019-08-04 MED ORDER — SODIUM CHLORIDE 0.9 % IV SOLN
500.0000 mg | Freq: Once | INTRAVENOUS | Status: DC
  Filled 2019-08-04: qty 500

## 2019-08-04 MED ORDER — SODIUM CHLORIDE 0.9 % IV SOLN
1.0000 g | Freq: Once | INTRAVENOUS | Status: DC
  Filled 2019-08-04: qty 10

## 2019-08-04 MED ORDER — CARBAMAZEPINE 200 MG PO TABS
400.0000 mg | ORAL_TABLET | Freq: Two times a day (BID) | ORAL | Status: DC
  Administered 2019-08-05 – 2019-08-10 (×11): 400 mg via ORAL
  Filled 2019-08-04 (×12): qty 2

## 2019-08-04 MED ORDER — ASPIRIN 81 MG PO CHEW: 81.0000 mg | CHEWABLE_TABLET | Freq: Every day | ORAL | Status: DC

## 2019-08-04 MED ORDER — SODIUM CHLORIDE 0.9 % IV SOLN
3.0000 g | Freq: Once | INTRAVENOUS | Status: AC
  Administered 2019-08-04: 22:00:00 3 g via INTRAVENOUS
  Filled 2019-08-04: qty 3

## 2019-08-04 MED ORDER — ROSUVASTATIN CALCIUM 20 MG PO TABS
20.0000 mg | ORAL_TABLET | Freq: Every day | ORAL | Status: DC
  Administered 2019-08-05 – 2019-08-09 (×5): 20 mg via ORAL
  Filled 2019-08-04 (×5): qty 1

## 2019-08-04 MED ORDER — ACETAMINOPHEN 650 MG RE SUPP: 650.0000 mg | Freq: Four times a day (QID) | RECTAL | Status: DC | PRN

## 2019-08-04 MED ORDER — OLMESARTAN MEDOXOMIL-HCTZ 20-12.5 MG PO TABS: 1.0000 | ORAL_TABLET | ORAL | Status: DC

## 2019-08-04 NOTE — Progress Notes (Signed)
Pharmacy Antibiotic Note  Richard Mitchell is a 79 y.o. male admitted on 08/04/2019 with aspiration pneumonia.  Pharmacy has been consulted for Unasyn dosing.  Plan: Unasyn 3g IV Q6H.  Height: 6\' 2"  (188 cm) Weight: 159 lb (72.1 kg) IBW/kg (Calculated) : 82.2  Temp (24hrs), Avg:98.8 F (37.1 C), Min:98.8 F (37.1 C), Max:98.8 F (37.1 C)  Recent Labs  Lab 08/04/19 1905 08/04/19 2057 08/04/19 2156  WBC 13.0*  --   --   CREATININE 1.17  --   --   LATICACIDVEN  --  0.7 0.9    Estimated Creatinine Clearance: 51.4 mL/min (by C-G formula based on SCr of 1.17 mg/dL).    No Known Allergies   Thank you for allowing pharmacy to be a part of this patient's care.  Wynona Neat, PharmD, BCPS  08/04/2019 11:52 PM

## 2019-08-04 NOTE — ED Notes (Signed)
Pt O2 sat 89% on RA. RN placed pt on 2L O2 via [place]. O2 sat now 97%

## 2019-08-04 NOTE — H&P (Signed)
History and Physical    Richard Mitchell [id number] DOB: May 04, 1941 DOA: 08/04/2019  PCP: Marton Redwood, MD Patient coming from: Home  Chief Complaint: Generalized weakness, falls  HPI: Richard Mitchell is a 79 y.o. male with medical history significant of anemia, chronic dysphagia due to history of CVA, gait disturbance, history of subdural hematoma status post evacuation via craniotomy, hypertension, PAD, seizure disorder presenting to the ED via EMS for evaluation of generalized weakness and recent falls.  Wife reported to ED provider that patient has been weak for the past 2 to 3 days and has been falling very often.  He fell just prior to arrival and hit his head and was unable to get up.  Wife also reported that patient has a history of dysphagia from a prior stroke and has been coughing more often.  Patient states he has fallen twice in the past 2 days.  He normally uses a walker but fell while transferring from his chair to the walker.  He did hit strike his head on the carpet.  He also experienced pain in his left hip after one of the falls.  Does report having a nonproductive cough sometimes.  Denies shortness of breath.  Denies lightheadedness/dizziness, chest pain, or episodes of syncope.  States his appetite is good.  Denies nausea, vomiting, abdominal pain, diarrhea, or dysuria.  ED Course: Oxygen saturation as low as 89% on room air reported by nursing staff.  Afebrile.  Bradycardic with heart rate in the 40s.  Labs showing WBC count 13.0.  Lactic acid level checked twice normal.  Hemoglobin 10.8 and MCV 99, no recent labs for comparison.  High-sensitivity troponin x2 negative.  UA with negative nitrite, trace amount of leukocytes, 0-5 WBCs, and rare bacteria.  Blood culture x2 pending.  SARS-CoV-2 PCR test negative.  Influenza panel negative.  Chest x-ray personally reviewed showing patchy airspace opacity at the right lung base concerning for aspiration pneumonia.  Pelvic fracture  negative for acute fracture or dislocation.  Pelvic CT negative for acute fracture.  Head CT negative for acute intracranial abnormality.  CT C-spine negative for acute bony abnormality.  Patient received Unasyn and 1 L normal saline bolus.  Review of Systems:  All systems reviewed and apart from history of presenting illness, are negative.  Past Medical History:  Diagnosis Date  . Anemia   . 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 Carotid artery occlusion   . Dysphasia S/P CVA (cerebrovascular accident)   . Gait disturbance 11/12/2012  . History of subdural hematoma (post traumatic) Approx 1998  . Hypertension   . Keratosis 11/12/2012   x2  . Onychomycosis 11/12/2012   extreme neglect x 10  . Osteoporosis   . PAD (peripheral artery disease) ([place]) 11/12/2012   possible due to diminished pedal pulses  . Seizure disorder [facility])     Past Surgical History:  Procedure Laterality Date  . CAROTID ENDARTERECTOMY Left 10-13-2009  . SUBDURAL HEMATOMA EVACUATION VIA CRANIOTOMY       reports that he quit smoking about 31 years ago. He has never used smokeless tobacco. He reports that he does not drink alcohol or use drugs.  No Known Allergies  Family History  Problem Relation Age of Onset  . Heart disease Mother        had pacemaker  . Cancer Mother        Breast  . Heart disease Sister   . Cancer Sister        Pancreatic  .  Deep vein thrombosis Sister   . Heart attack Sister   . COPD Father     Prior to Admission medications   Medication Sig Start Date End Date Taking? Authorizing Provider  aspirin 81 MG tablet Take 81 mg by mouth daily.   Yes [provider]  carbamazepine (TEGRETOL) 200 MG tablet Take 400 mg by mouth 2 (two) times daily.    Yes [provider]  Carboxymethylcellul-Glycerin (CLEAR EYES FOR DRY EYES OP) Place 1 drop into both eyes 2 (two) times daily.   Yes [provider]  olmesartan-hydrochlorothiazide (BENICAR HCT) 20-12.5 MG tablet Take 1  tablet by mouth every morning. 11/16/17  Yes [provider]  rosuvastatin (CRESTOR) 20 MG tablet Take 20 mg by mouth daily.  10/20/16  Yes [provider]  Vitamin D, Ergocalciferol, (DRISDOL) 50000 UNITS CAPS Take 50,000 Units by mouth every 7 (seven) days.   Yes [provider]    Physical Exam: Vitals:   08/04/19 2000 08/04/19 2048 08/04/19 2200 08/04/19 2300  BP: 139/65  (!) 160/65   Pulse: (!) 43 93 (!) 46   Resp: 13 18 17    Temp:      TempSrc:      SpO2: 99% 100% 99%   Weight:    72.1 kg  Height:    6\' 2"  (1.88 m)    Physical Exam  Constitutional: He is oriented to person, place, and time. He appears well-developed and well-nourished. No distress.  HENT:  Head: Normocephalic.  Mouth/Throat: Oropharynx is clear and moist.  Eyes: Right eye exhibits no discharge. Left eye exhibits no discharge.  Cardiovascular: Normal rate, regular rhythm and intact distal pulses.  Pulmonary/Chest: Effort normal and breath sounds normal. No respiratory distress. He has no wheezes. He has no rales.  On 2 L supplemental oxygen  Abdominal: Soft. Bowel sounds are normal. He exhibits no distension. There is no abdominal tenderness. There is no guarding.  Musculoskeletal:        General: No edema.     Cervical back: Neck supple.  Neurological: He is alert and oriented to person, place, and time.  Moving all extremities spontaneously  Skin: Skin is warm and dry. He is not diaphoretic.     Labs on Admission: I have personally reviewed following labs and imaging studies  CBC: Recent Labs  Lab 08/04/19 1905  WBC 13.0*  NEUTROABS 10.7*  HGB 10.8*  HCT 34.3*  MCV 99.4  PLT 123XX123   Basic Metabolic Panel: Recent Labs  Lab 08/04/19 1905  NA 135  K 4.9  CL 102  CO2 23  GLUCOSE 118*  BUN 33*  CREATININE 1.17  CALCIUM 8.9   GFR: Estimated Creatinine Clearance: 50.5 mL/min (by C-G formula based on SCr of 1.17 mg/dL). Liver Function Tests: Recent Labs  Lab  08/04/19 1905  AST 17  ALT 15  ALKPHOS 66  BILITOT 0.4  PROT 6.8  ALBUMIN 3.8   No results for input(s): LIPASE, AMYLASE in the last 168 hours. No results for input(s): AMMONIA in the last 168 hours. Coagulation Profile: No results for input(s): INR, PROTIME in the last 168 hours. Cardiac Enzymes: No results for input(s): CKTOTAL, CKMB, CKMBINDEX, TROPONINI in the last 168 hours. BNP (last 3 results) No results for input(s): PROBNP in the last 8760 hours. HbA1C: No results for input(s): HGBA1C in the last 72 hours. CBG: No results for input(s): GLUCAP in the last 168 hours. Lipid Profile: No results for input(s): CHOL, HDL, LDLCALC, TRIG, CHOLHDL,  LDLDIRECT in the last 72 hours. Thyroid Function Tests: No results for input(s): TSH, T4TOTAL, FREET4, T3FREE, THYROIDAB in the last 72 hours. Anemia Panel: No results for input(s): VITAMINB12, FOLATE, FERRITIN, TIBC, IRON, RETICCTPCT in the last 72 hours. Urine analysis:    Component Value Date/Time   COLORURINE YELLOW 08/04/2019 1907   APPEARANCEUR CLEAR 08/04/2019 1907   LABSPEC 1.019 08/04/2019 1907   PHURINE 7.0 08/04/2019 1907   GLUCOSEU NEGATIVE 08/04/2019 1907   HGBUR NEGATIVE 08/04/2019 [place] NEGATIVE 08/04/2019 1907   KETONESUR NEGATIVE 08/04/2019 1907   PROTEINUR NEGATIVE 08/04/2019 1907   UROBILINOGEN 1.0 10/12/2009 1207   NITRITE NEGATIVE 08/04/2019 1907   LEUKOCYTESUR TRACE (A) 08/04/2019 1907    Radiological Exams on Admission: CT Head Wo Contrast  Result Date: 08/04/2019 CLINICAL DATA:  Multiple recent falls. EXAM: CT HEAD WITHOUT CONTRAST TECHNIQUE: Contiguous axial images were obtained from the base of the skull through the vertex without intravenous contrast. COMPARISON:  11/18/2007 FINDINGS: Brain: Prior right temporal craniectomy. Encephalomalacia in the right temporal and frontoparietal lobes. This is unchanged. There is atrophy and chronic small vessel disease changes. No acute intracranial  abnormality. Specifically, no hemorrhage, hydrocephalus, mass lesion, acute infarction, or significant intracranial injury. Vascular: No hyperdense vessel or unexpected calcification. Skull: No acute calvarial abnormality. Sinuses/Orbits: Visualized paranasal sinuses and mastoids clear. Orbital soft tissues unremarkable. Other: None IMPRESSION: Prior right temporal craniectomy with underlying encephalomalacia. Atrophy, chronic microvascular disease. No acute intracranial abnormality. Electronically Signed   By: Rolm Baptise M.D.   On: 08/04/2019 20:39   CT Cervical Spine Wo Contrast  Result Date: 08/04/2019 CLINICAL DATA:  Multiple recent falls EXAM: CT CERVICAL SPINE WITHOUT CONTRAST TECHNIQUE: Multidetector CT imaging of the cervical spine was performed without intravenous contrast. Multiplanar CT image reconstructions were also generated. COMPARISON:  None. FINDINGS: Alignment: Normal Skull base and vertebrae: No acute fracture. No primary bone lesion or focal pathologic process. Soft tissues and spinal canal: No prevertebral fluid or swelling. No visible canal hematoma. Disc levels:  Diffuse advanced degenerative disc and facet disease. Upper chest: Biapical scarring. Other: No acute findings IMPRESSION: Degenerative changes.  No acute bony abnormality. Electronically Signed   By: Rolm Baptise M.D.   On: 08/04/2019 20:41   CT PELVIS WO CONTRAST  Result Date: 08/04/2019 CLINICAL DATA:  Fall EXAM: CT PELVIS WITHOUT CONTRAST TECHNIQUE: Multidetector CT imaging of the pelvis was performed following the standard protocol without intravenous contrast. COMPARISON:  Plain films today FINDINGS: Urinary Tract: Bladder wall thickening present. There is a 3 cm left lateral bladder wall diverticulum. Visualized distal ureters decompressed. Bowel: Colonic diverticulosis. No active diverticulitis or evidence of bowel obstruction. Moderate stool in the visualized colon. Vascular/Lymphatic: Heavily calcified aorta and  iliac vessels. No aneurysm or adenopathy. Reproductive:  Prostate enlargement. Other:  No free fluid or free air. Musculoskeletal: And soft tissue calcifications Corticated bone noted in the soft tissues superior to the right greater trochanter. These are likely related to old avulsion fracture or soft tissue injury. No acute fracture. Moderate symmetric degenerative changes within the hips bilaterally with joint space narrowing and spurring. Degenerative changes in the visualized lower lumbar spine. IMPRESSION: Corticated bone fragments and soft tissue calcifications in the soft tissue superior to the right greater trochanter, likely related to old injury. No acute fracture seen. Moderate degenerative changes in the hips bilaterally. Colonic diverticulosis. Prostate prominence. Associated bladder wall thickening and left lateral bladder wall diverticulum. Electronically Signed   By: Rolm Baptise M.D.  On: 08/04/2019 20:55   DG Pelvis Portable  Result Date: 08/04/2019 CLINICAL DATA:  Pain following fall EXAM: PORTABLE PELVIS 1-2 VIEWS COMPARISON:  None. FINDINGS: There is no evidence of acute pelvic fracture or dislocation. There is evidence of prior avulsion along the greater trochanter with bony fragments in the lateral aspect of the elbow joint, chronic in appearance. There is moderate narrowing of the hip joints, slightly more severe on the right than on the left. No erosive change. Bones are osteoporotic. Multiple foci of vascular calcification noted in the distal aorta as well as in iliac and femoral artery branches. IMPRESSION: Prior avulsion along the greater trochanter on the right with bony fragments in the lateral aspect of the right elbow joint, chronic in appearance. No acute appearing fracture or dislocation. Narrowing of each hip joint noted, more severe on the right than on the left. Bones osteoporotic. Multifocal arterial vascular calcification noted. Electronically Signed   By: Lowella Grip III M.D.   On: 08/04/2019 19:25   DG Chest Port 1 View  Result Date: 08/04/2019 CLINICAL DATA:  Pain following fall EXAM: PORTABLE CHEST 1 VIEW COMPARISON:  Chest radiograph October 12, 2009 and chest CT March 29, 2010 FINDINGS: There is patchy airspace opacity in the right base. There is a degree of underlying emphysematous change, better seen on prior CT. Heart is mildly enlarged with pulmonary vascularity normal. No adenopathy. There is aortic atherosclerosis. Bones are osteoporotic. IMPRESSION: Patchy airspace opacity consistent with pneumonia right base. Underlying emphysematous change. Mild cardiomegaly. Aortic Atherosclerosis (ICD10-I70.0). Electronically Signed   By: Lowella Grip III M.D.   On: 08/04/2019 19:27    EKG: Independently reviewed.  Sinus bradycardia, heart rate 48.  PR prolongation.  Rate decreased since prior tracing.  Assessment/Plan Principal Problem:   Aspiration pneumonia (HCC) Active Problems:   Essential hypertension   Dysphagia   Generalized weakness   Bradycardia   Aspiration pneumonia Oxygen saturation as low as 89% on room air, currently satting well on 2 L supplemental oxygen.  Afebrile.  Labs showing mild leukocytosis.  Lactic acid x2 normal.  No signs of sepsis.  SARS-CoV-2 PCR test negative.  Influenza panel negative.  Chest x-ray personally reviewed showing patchy airspace opacity at the right lung base concerning for aspiration pneumonia given history of chronic dysphagia from prior stroke.   -Continue Unasyn -Continue to monitor white blood cell count -Blood culture x2 pending -Continuous pulse ox, supplemental oxygen as needed to keep oxygen saturation above 92%  Chronic dysphagia due to history of prior stroke -Keep n.p.o. -Aspiration precautions -SLP eval  Generalized weakness, recent falls Suspect related to acute illness/pneumonia and bradycardia also likely contributing.  Head CT negative for acute intracranial abnormality.  CT  C-spine negative for acute bony abnormality.  Pelvic x-ray and CT imaging without evidence of acute fracture. -Management of pneumonia as mentioned above -PT and OT evaluation -Fall precautions -Check TSH and B12 levels  Sinus bradycardia, first-degree AV block Heart rate in the 40s and EKG showing first-degree AV block.  Not hypotensive.  No syncopal episodes reported. Not on any AV nodal blocking agent.  Bradycardia and PR prolongation seen on prior EKG from July 2019 as well, rate now slower. -Cardiac monitoring -Check TSH level  Normocytic anemia Hemoglobin 10.8 and MCV 99, no recent labs for comparison.  No prior colonoscopy results in the chart. -Check FOBT -Anemia panel  Abnormal urinalysis UA with trace amount of leukocytes and rare bacteria. -Check urine culture  Hypertension Blood pressure  slightly elevated. -Continue home Benicar HCT -Hydralazine as needed for SBP >160  Seizure disorder -Stable.  Continue home Tegretol  DVT prophylaxis: SCDs at this time, FOBT pending Code Status: Patient wishes to be full code. Family Communication: No family available at this time. Disposition Plan: Anticipate discharge after clinical improvement. Consults called: None Admission status: It is my clinical opinion that admission to INPATIENT is reasonable and necessary in this 79 y.o. male . presenting with generalized weakness, recent falls in the setting of aspiration pneumonia.  Needs IV antibiotic and PT/OT evaluation.  Given the aforementioned, the predictability of an adverse outcome is felt to be significant. I expect that the patient will require at least 2 midnights in the hospital to treat this condition.   The medical decision making on this patient was of high complexity and the patient is at high risk for clinical deterioration, therefore this is a level 3 visit.  Shela Leff MD [facility]  If 7PM-7AM, please contact  night-coverage [email or web address] Password [facility]  08/05/2019, 12:03 AM

## 2019-08-04 NOTE — ED Notes (Signed)
Pt transported to CT ?

## 2019-08-04 NOTE — ED Provider Notes (Signed)
[facility] DEPART[facility] Provider Note   CSN: [id number] Arrival date & time: 08/04/19  1849     History Chief Complaint  Patient presents with  . Weakness    Richard Mitchell is a 79 y.o. 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  Patient is living at home with his wife.  Per his wife, he has been weaker over the last 2 to 3 days and has been falling very often .  He fell just prior to arrival and hit his head and was unable to get up. Patient was noted to have weakness of bilateral legs.  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 She was concerned that he may have pneumonia .  Patient is unable to give much history.   The history is provided by the patient.       Past Medical History:  Diagnosis Date  . Anemia   . Cancer ([place])    [place]  . Carotid artery occlusion   . Dysphasia S/P CVA (cerebrovascular accident)   . Gait disturbance 11/12/2012  . History of subdural hematoma (post traumatic) Approx 1998  . Hypertension   . Keratosis 11/12/2012   x2  . Onychomycosis 11/12/2012   extreme neglect x 10  . Osteoporosis   . PAD (peripheral artery disease) ([place]) 11/12/2012   possible due to diminished pedal pulses  . Seizure disorder [facility])     Patient Active Problem List   Diagnosis Date Noted  . Essential hypertension 01/21/2018  . Hyperlipidemia 01/21/2018  . Aftercare following surgery of the circulatory system, [place] 03/03/2014  . Occlusion and stenosis of carotid artery with cerebral infarction 03/03/2014  . PAD (peripheral artery disease) ([place])   . Onychomycosis   . Keratosis   . Occlusion and stenosis of carotid artery without mention of cerebral infarction 02/24/2013  . Gait disturbance 11/12/2012    Past Surgical History:  Procedure Laterality Date  . CAROTID ENDARTERECTOMY Left 10-13-2009    . SUBDURAL HEMATOMA EVACUATION VIA CRANIOTOMY         Family History  Problem Relation Age of Onset  . Heart disease Mother        had pacemaker  . Cancer Mother        Breast  . Heart disease Sister   . Cancer Sister        Pancreatic  . Deep vein thrombosis Sister   . Heart attack Sister   . COPD Father     Social History   Tobacco Use  . Smoking status: Former Smoker    Quit date: 11/19/1987    Years since quitting: 31.7  . Smokeless tobacco: Never Used  Substance Use Topics  . Alcohol use: No    Alcohol/week: 0.0 standard drinks  . Drug use: No    Home Medications Prior to Admission medications   Medication Sig Start Date End Date Taking? Authorizing Provider  aspirin 81 MG tablet Take 81 mg by mouth daily.    [provider]  carbamazepine (TEGRETOL) 200 MG tablet Take 400 mg by mouth 2 (two) times daily.     [provider]  olmesartan-hydrochlorothiazide (BENICAR HCT) 20-12.5 MG tablet Take 1 tablet by mouth every morning. 11/16/17   [provider]  rosuvastatin (CRESTOR) 20 MG tablet TAKE 20 MG ONCE DAILY. 10/20/16   [provider]  Vitamin D, Ergocalciferol, (DRISDOL)  50000 UNITS CAPS Take 50,000 Units by mouth every 7 (seven) days.    [provider]    Allergies    Patient has no known allergies.  Review of Systems   Review of Systems  Neurological: Positive for weakness.  All other systems reviewed and are negative.   Physical Exam Updated Vital Signs BP 139/65   Pulse 93   Temp 98.8 F (37.1 C) (Oral)   Resp 18   SpO2 100%   Physical Exam Vitals and nursing note reviewed.  Constitutional:      Comments: Altered, confused   HENT:     Head: Normocephalic.     Comments: No obvious scalp hematoma     Mouth/Throat:     Mouth: Mucous membranes are moist.  Eyes:     Extraocular Movements: Extraocular movements intact.     Pupils: Pupils are equal, round, and reactive to light.  Cardiovascular:      Rate and Rhythm: Normal rate and regular rhythm.     Pulses: Normal pulses.     Heart sounds: Normal heart sounds.  Pulmonary:     Effort: Pulmonary effort is normal.     Comments: Diminished bilateral bases  Abdominal:     General: Abdomen is flat.     Palpations: Abdomen is soft.  Musculoskeletal:        General: Normal range of motion.     Cervical back: Normal range of motion.     Comments: No spinal tenderness   Skin:    General: Skin is warm.  Neurological:     Comments: A & O x 1. Moving all extremities, strength 5/5 bilateral arms and 4/5 bilateral legs   Psychiatric:        Mood and Affect: Mood normal.     ED Results / Procedures / Treatments   Labs (all labs ordered are listed, but only abnormal results are displayed) Labs Reviewed  CBC WITH DIFFERENTIAL/PLATELET - Abnormal; Notable for the following components:      Result Value   WBC 13.0 (*)    RBC 3.45 (*)    Hemoglobin 10.8 (*)    HCT 34.3 (*)    Neutro Abs 10.7 (*)    Monocytes Absolute 1.1 (*)    All other components within normal limits  COMPREHENSIVE METABOLIC PANEL - Abnormal; Notable for the following components:   Glucose, Bld 118 (*)    BUN 33 (*)    GFR calc non Af Amer 59 (*)    All other components within normal limits  URINALYSIS, ROUTINE W REFLEX MICROSCOPIC - Abnormal; Notable for the following components:   Leukocytes,Ua TRACE (*)    Bacteria, UA RARE (*)    All other components within normal limits  RESPIRATORY PANEL BY RT PCR (FLU A&B, COVID)  CULTURE, BLOOD (ROUTINE X 2)  CULTURE, BLOOD (ROUTINE X 2)  LACTIC ACID, PLASMA  LACTIC ACID, PLASMA  TROPONIN I (HIGH SENSITIVITY)  TROPONIN I (HIGH SENSITIVITY)    EKG EKG Interpretation  Date/Time:  Tuesday August 04 2019 18:52:20 EST Ventricular Rate:  48 PR Interval:    QRS Duration: 109 QT Interval:  464 QTC Calculation: 415 R Axis:   78 Text Interpretation: Sinus bradycardia Prolonged PR interval RSR' in V1 or V2, right  VCD or RVH Abnrm T, consider ischemia, anterolateral lds Minimal ST elevation, anterior leads Since last tracing rate slower Confirmed by Richard Mitchell [phone]) on 08/04/2019 7:07:40 PM   Radiology CT Head Wo Contrast  Result  Date: 08/04/2019 CLINICAL DATA:  Multiple recent falls. EXAM: CT HEAD WITHOUT CONTRAST TECHNIQUE: Contiguous axial images were obtained from the base of the skull through the vertex without intravenous contrast. COMPARISON:  11/18/2007 FINDINGS: Brain: Prior right temporal craniectomy. Encephalomalacia in the right temporal and frontoparietal lobes. This is unchanged. There is atrophy and chronic small vessel disease changes. No acute intracranial abnormality. Specifically, no hemorrhage, hydrocephalus, mass lesion, acute infarction, or significant intracranial injury. Vascular: No hyperdense vessel or unexpected calcification. Skull: No acute calvarial abnormality. Sinuses/Orbits: Visualized paranasal sinuses and mastoids clear. Orbital soft tissues unremarkable. Other: None IMPRESSION: Prior right temporal craniectomy with underlying encephalomalacia. Atrophy, chronic microvascular disease. No acute intracranial abnormality. Electronically Signed   By: Richard Mitchell M.D.   On: 08/04/2019 20:39   CT Cervical Spine Wo Contrast  Result Date: 08/04/2019 CLINICAL DATA:  Multiple recent falls EXAM: CT CERVICAL SPINE WITHOUT CONTRAST TECHNIQUE: Multidetector CT imaging of the cervical spine was performed without intravenous contrast. Multiplanar CT image reconstructions were also generated. COMPARISON:  None. FINDINGS: Alignment: Normal Skull base and vertebrae: No acute fracture. No primary bone lesion or focal pathologic process. Soft tissues and spinal canal: No prevertebral fluid or swelling. No visible canal hematoma. Disc levels:  Diffuse advanced degenerative disc and facet disease. Upper chest: Biapical scarring. Other: No acute findings IMPRESSION: Degenerative changes.  No acute bony  abnormality. Electronically Signed   By: Richard Mitchell M.D.   On: 08/04/2019 20:41   CT PELVIS WO CONTRAST  Result Date: 08/04/2019 CLINICAL DATA:  Fall EXAM: CT PELVIS WITHOUT CONTRAST TECHNIQUE: Multidetector CT imaging of the pelvis was performed following the standard protocol without intravenous contrast. COMPARISON:  Plain films today FINDINGS: Urinary Tract: Bladder wall thickening present. There is a 3 cm left lateral bladder wall diverticulum. Visualized distal ureters decompressed. Bowel: Colonic diverticulosis. No active diverticulitis or evidence of bowel obstruction. Moderate stool in the visualized colon. Vascular/Lymphatic: Heavily calcified aorta and iliac vessels. No aneurysm or adenopathy. Reproductive:  Prostate enlargement. Other:  No free fluid or free air. Musculoskeletal: And soft tissue calcifications Corticated bone noted in the soft tissues superior to the right greater trochanter. These are likely related to old avulsion fracture or soft tissue injury. No acute fracture. Moderate symmetric degenerative changes within the hips bilaterally with joint space narrowing and spurring. Degenerative changes in the visualized lower lumbar spine. IMPRESSION: Corticated bone fragments and soft tissue calcifications in the soft tissue superior to the right greater trochanter, likely related to old injury. No acute fracture seen. Moderate degenerative changes in the hips bilaterally. Colonic diverticulosis. Prostate prominence. Associated bladder wall thickening and left lateral bladder wall diverticulum. Electronically Signed   By: Richard Mitchell M.D.   On: 08/04/2019 20:55   DG Pelvis Portable  Result Date: 08/04/2019 CLINICAL DATA:  Pain following fall EXAM: PORTABLE PELVIS 1-2 VIEWS COMPARISON:  None. FINDINGS: There is no evidence of acute pelvic fracture or dislocation. There is evidence of prior avulsion along the greater trochanter with bony fragments in the lateral aspect of the elbow  joint, chronic in appearance. There is moderate narrowing of the hip joints, slightly more severe on the right than on the left. No erosive change. Bones are osteoporotic. Multiple foci of vascular calcification noted in the distal aorta as well as in iliac and femoral artery branches. IMPRESSION: Prior avulsion along the greater trochanter on the right with bony fragments in the lateral aspect of the right elbow joint, chronic in appearance. No acute appearing fracture or  dislocation. Narrowing of each hip joint noted, more severe on the right than on the left. Bones osteoporotic. Multifocal arterial vascular calcification noted. Electronically Signed   By: Richard Mitchell M.D.   On: 08/04/2019 19:25   DG Chest Port 1 View  Result Date: 08/04/2019 CLINICAL DATA:  Pain following fall EXAM: PORTABLE CHEST 1 VIEW COMPARISON:  Chest radiograph October 12, 2009 and chest CT March 29, 2010 FINDINGS: There is patchy airspace opacity in the right base. There is a degree of underlying emphysematous change, better seen on prior CT. Heart is mildly enlarged with pulmonary vascularity normal. No adenopathy. There is aortic atherosclerosis. Bones are osteoporotic. IMPRESSION: Patchy airspace opacity consistent with pneumonia right base. Underlying emphysematous change. Mild cardiomegaly. Aortic Atherosclerosis (ICD10-I70.0). Electronically Signed   By: Richard Mitchell M.D.   On: 08/04/2019 19:27    Procedures Procedures (including critical care time)   CRITICAL CARE Performed by: Richard Mitchell   Total critical care time: 30  minutes  Critical care time was exclusive of separately billable procedures and treating other patients.  Critical care was necessary to treat or prevent imminent or life-threatening deterioration.  Critical care was time spent personally by me on the following activities: development of treatment plan with patient and/or surrogate as well as nursing, discussions with  consultants, evaluation of patient's response to treatment, examination of patient, obtaining history from patient or surrogate, ordering and performing treatments and interventions, ordering and review of laboratory studies, ordering and review of radiographic studies, pulse oximetry and re-evaluation of patient's condition.  Medications Ordered in ED Medications  sodium chloride 0.9 % bolus 1,000 mL (has no administration in time range)  cefTRIAXone (ROCEPHIN) 1 g in sodium chloride 0.9 % 100 mL IVPB (has no administration in time range)  azithromycin (ZITHROMAX) 500 mg in sodium chloride 0.9 % 250 mL IVPB (has no administration in time range)    ED Course  I have reviewed the triage vital signs and the nursing notes.  Pertinent labs & imaging results that were available during my care of the patient were reviewed by me and considered in my medical decision making (see chart for details).    MDM Rules/Calculators/A&P                      FARZAD AVITABILE is a 79 y.o. male here presenting with cough and shortness of breath. Patient apparently desatted to 89% per nursing staff .  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  I wonder if he had aspiration pneumonia or UTI or subdural hemorrhage .  Will get labs and CT head and chest x-ray and urinalysis.  9:14 PM Labs showed WBC is 13. CXR showed pneumonia. CT head unremarkable. Xray pelvis showed possible old fractures and CT confirmed that. Will admit for aspiration pneumonia with hypoxia. COVID negative.   Final Clinical Impression(s) / ED Diagnoses Final diagnoses:  None    Rx / DC Orders ED Discharge Orders    None       Drenda Freeze, MD 08/04/19 2124

## 2019-08-04 NOTE — ED Triage Notes (Signed)
Pt here from home via [facility] for fall due to increased weakness. Per wife, pt has had multiple falls recently. Pt denies pain, no obvious injuries. Pt hx of blood clots, takes aspirin. Per EMS pt in a-fib w/ no known hx of afib. VSS, AOx4.

## 2019-08-05 DIAGNOSIS — Y92009 Unspecified place in unspecified non-institutional (private) residence as the place of occurrence of the external cause: Secondary | ICD-10-CM | POA: Insufficient documentation

## 2019-08-05 DIAGNOSIS — W19XXXA Unspecified fall, initial encounter: Secondary | ICD-10-CM | POA: Insufficient documentation

## 2019-08-05 DIAGNOSIS — I10 Essential (primary) hypertension: Secondary | ICD-10-CM

## 2019-08-05 DIAGNOSIS — R531 Weakness: Secondary | ICD-10-CM

## 2019-08-05 DIAGNOSIS — R1310 Dysphagia, unspecified: Secondary | ICD-10-CM

## 2019-08-05 DIAGNOSIS — R001 Bradycardia, unspecified: Secondary | ICD-10-CM

## 2019-08-05 DIAGNOSIS — R1312 Dysphagia, oropharyngeal phase: Secondary | ICD-10-CM

## 2019-08-05 LAB — CBC
HCT: 31.9 % — ABNORMAL LOW (ref 39.0–52.0)
Hemoglobin: 10.3 g/dL — ABNORMAL LOW (ref 13.0–17.0)
MCH: 31.1 pg (ref 26.0–34.0)
MCHC: 32.3 g/dL (ref 30.0–36.0)
MCV: 96.4 fL (ref 80.0–100.0)
Platelets: 186 10*3/uL (ref 150–400)
RBC: 3.31 MIL/uL — ABNORMAL LOW (ref 4.22–5.81)
RDW: 13.9 % (ref 11.5–15.5)
WBC: 11 10*3/uL — ABNORMAL HIGH (ref 4.0–10.5)
nRBC: 0 % (ref 0.0–0.2)

## 2019-08-05 LAB — FERRITIN: Ferritin: 28 ng/mL (ref 24–336)

## 2019-08-05 LAB — IRON AND TIBC
Iron: 24 ug/dL — ABNORMAL LOW (ref 45–182)
Saturation Ratios: 8 % — ABNORMAL LOW (ref 17.9–39.5)
TIBC: 302 ug/dL (ref 250–450)
UIBC: 278 ug/dL

## 2019-08-05 LAB — RETICULOCYTES
Immature Retic Fract: 8.7 % (ref 2.3–15.9)
RBC.: 3.44 MIL/uL — ABNORMAL LOW (ref 4.22–5.81)
Retic Count, Absolute: 48.2 10*3/uL (ref 19.0–186.0)
Retic Ct Pct: 1.4 % (ref 0.4–3.1)

## 2019-08-05 LAB — FOLATE: Folate: 24.9 ng/mL (ref >5.9)

## 2019-08-05 LAB — TSH: TSH: 2.256 u[IU]/mL (ref 0.350–4.500)

## 2019-08-05 LAB — VITAMIN B12: Vitamin B-12: 399 pg/mL (ref 180–914)

## 2019-08-05 LAB — MRSA PCR SCREENING: MRSA by PCR: NEGATIVE

## 2019-08-05 MED ORDER — IRBESARTAN 150 MG PO TABS
150.0000 mg | ORAL_TABLET | Freq: Every day | ORAL | Status: DC
  Administered 2019-08-05 – 2019-08-08 (×4): 150 mg via ORAL
  Filled 2019-08-05 (×5): qty 1

## 2019-08-05 MED ORDER — HYDROCHLOROTHIAZIDE 12.5 MG PO CAPS: 12.5000 mg | ORAL_CAPSULE | Freq: Every day | ORAL | Status: DC

## 2019-08-05 MED ORDER — HYDROCHLOROTHIAZIDE 25 MG PO TABS
12.5000 mg | ORAL_TABLET | Freq: Every day | ORAL | Status: DC
  Administered 2019-08-05 – 2019-08-08 (×4): 12.5 mg via ORAL
  Filled 2019-08-05 (×5): qty 1

## 2019-08-05 MED ORDER — HYDROCHLOROTHIAZIDE 10 MG/ML ORAL SUSPENSION
12.5000 mg | Freq: Every day | ORAL | Status: DC
  Filled 2019-08-05: qty 1.88

## 2019-08-05 MED ORDER — SODIUM CHLORIDE 0.9 % IV SOLN: INTRAVENOUS | Status: DC

## 2019-08-05 MED ORDER — ENOXAPARIN SODIUM 40 MG/0.4ML ~~LOC~~ SOLN
40.0000 mg | SUBCUTANEOUS | Status: DC
  Administered 2019-08-05 – 2019-08-09 (×5): 40 mg via SUBCUTANEOUS
  Filled 2019-08-05 (×5): qty 0.4

## 2019-08-05 MED ORDER — HYDROCHLOROTHIAZIDE 12.5 MG PO CAPS
12.5000 mg | ORAL_CAPSULE | Freq: Every day | ORAL | Status: DC
  Filled 2019-08-05: qty 1

## 2019-08-05 NOTE — Progress Notes (Addendum)
Rehab Admissions Coordinator Note:  Per OT recommendation atient was screened by Michel Santee for appropriateness for an Inpatient Acute Rehab Consult.  Note OT note recommends SNF, but recommendation showing up as CIR.  Pt does not have the medical necessity to warrant a CIR admission at this time, and we are recommending [facility].  Michel Santee 08/05/2019, 11:15 AM  I can be reached at [phone].

## 2019-08-05 NOTE — Progress Notes (Addendum)
PROGRESS NOTE    Richard Mitchell  [id number] DOB: 09/22/1940 DOA: 08/04/2019 PCP: Marton Redwood, MD  Brief Narrative: Chief Complaint: Generalized weakness, falls HPI: MALEK Mitchell is a 79 y.o. 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  Wife reported to ED provider that patient has been weak for the past 2 to 3 days and has been falling very often.  He fell just prior to arrival and hit his head and was unable to get up.  Wife also reported that patient has a history of dysphagia from a prior stroke and has been coughing more often. -In the emergency room he was noted to be mildly hypoxic, heart rate in the 40s, labs noted white count of 13 urinalysis was unremarkable, COVID-19 PCR was negative, chest x-ray showed patchy airspace opacity in the right lung base concerning for aspiration pneumonia pelvic x-rays and CTs were negative for acute fractures, head CT did not show any acute abnormality  Assessment & Plan:   Aspiration pneumonia -Initially noted to be mildly hypoxemic, given dysphagia history this will always be a risk, mild leukocytosis is also on admission with x-ray noting right lower lobe opacity  -Continue IV Unasyn  -Follow-up blood cultures  -Discontinue IV fluids today  -Ambulate  -Wean O2   Chronic dysphagia -Aspiration precautions, start dysphagia diet -SLP eval requested -Addendum SLP eval with worsening dysphagia, high aspiration risk, will request palliative care evaluation, called and discussed with patient and wife, did not recommend a feeding tube  Generalized weakness, recent falls Suspect related to acute illness/pneumonia -Unclear if he had worsening bradycardia at home  -Head CT, C-spine and pelvic x-ray/CTA  negative for fracture -PT OT -CIR eval recommended  Sinus bradycardia, first-degree AV block Heart rate in the 40s and EKG showing first-degree AV block.  -Asymptomatic at this time, -Not on any AV nodal blocking agents -Monitor on telemetry, monitor with activity -TSH is normal  Normocytic anemia Hemoglobin 10.8 and MCV 99, no recent labs for comparison -Anemia panel with iron deficiency, will give him a dose of IV iron today, does not recall having a colonoscopy in the past  Hypertension -stable, stop IVF -Continue home Benicar HCT  Seizure disorder -Stable.  Continue home Tegretol  DVT prophylaxis: SCDs at this time, FOBT pending Code Status: Patient wishes to be full code. Family Communication: No family available at this time. Disposition Plan: Anticipate discharge after clinical improvement. Consults called: None   DVT prophylaxis: Add Lovenox Code Status: We discussed CODE STATUS with patient and wife, they are agreeable to DNR Family Communication: Called and discussed with patient's wife [name] Disposition Plan:?  CIR likely 48 hours if stable, pending improvement in pneumonia, swallowing assessments    Procedures:   Antimicrobials:    Subjective: -No events overnight, feels a little bit stronger today, denies any shortness of breath  Objective: Vitals:   08/05/19 0300 08/05/19 0340 08/05/19 0732 08/05/19 1045  BP:  129/71 (!) 157/65 (!) 172/65  Pulse: (!) 58 (!) 55 (!) 53   Resp: 14 17    Temp:  97.7 F (36.5 C) 97.6 F (36.4 C) 97.8 F (36.6 C)  TempSrc:  Oral Oral Oral  SpO2: 98% 95% 96%   Weight:      Height:        Intake/Output  Summary (Last 24 hours) at 08/05/2019 1058 Last data filed at 08/05/2019 1047 Gross per 24 hour  Intake 1622.54 ml  Output 1200 ml  Net 422.54 ml   Filed Weights   08/04/19 2300 08/05/19 0023  Weight: 72.1 kg 76.2 kg    Examination:  Elderly frail gentleman sitting in the recliner, awake alert,  oriented to self place, partly to time Respiratory system: Poor air movement, few basilar rhonchi Cardiovascular system: S1-S2, regular rate rhythm  gastrointestinal system: Abdomen is nondistended, soft and nontender.Normal bowel sounds heard. Central nervous system: Awake alert, moves all extremities, no localizing signs Extremities: No edema Skin: no rashes Psychiatry: Flat affect    Data Reviewed:   CBC: Recent Labs  Lab 08/04/19 1905 08/05/19 0211  WBC 13.0* 11.0*  NEUTROABS 10.7*  --   HGB 10.8* 10.3*  HCT 34.3* 31.9*  MCV 99.4 96.4  PLT 199 [id number]   Basic Metabolic Panel: Recent Labs  Lab 08/04/19 1905  NA 135  K 4.9  CL 102  CO2 23  GLUCOSE 118*  BUN 33*  CREATININE 1.17  CALCIUM 8.9   GFR: Estimated Creatinine Clearance: 53.4 mL/min (by C-G formula based on SCr of 1.17 mg/dL). Liver Function Tests: Recent Labs  Lab 08/04/19 1905  AST 17  ALT 15  ALKPHOS 66  BILITOT 0.4  PROT 6.8  ALBUMIN 3.8   No results for input(s): LIPASE, AMYLASE in the last 168 hours. No results for input(s): AMMONIA in the last 168 hours. Coagulation Profile: No results for input(s): INR, PROTIME in the last 168 hours. Cardiac Enzymes: No results for input(s): CKTOTAL, CKMB, CKMBINDEX, TROPONINI in the last 168 hours. BNP (last 3 results) No results for input(s): PROBNP in the last 8760 hours. HbA1C: No results for input(s): HGBA1C in the last 72 hours. CBG: No results for input(s): GLUCAP in the last 168 hours. Lipid Profile: No results for input(s): CHOL, HDL, LDLCALC, TRIG, CHOLHDL, LDLDIRECT in the last 72 hours. Thyroid Function Tests: Recent Labs    08/05/19 0033  TSH 2.256   Anemia Panel: Recent Labs    08/05/19 0033  VITAMINB12 399  FOLATE 24.9  FERRITIN 28  TIBC 302  IRON 24*  RETICCTPCT 1.4   Urine analysis:    Component Value Date/Time   COLORURINE YELLOW 08/04/2019 1907   APPEARANCEUR CLEAR 08/04/2019 1907   LABSPEC 1.019 08/04/2019 1907    PHURINE 7.0 08/04/2019 1907   GLUCOSEU NEGATIVE 08/04/2019 [place] NEGATIVE 08/04/2019 1907   BILIRUBINUR NEGATIVE 08/04/2019 1907   KETONESUR NEGATIVE 08/04/2019 1907   PROTEINUR NEGATIVE 08/04/2019 1907   UROBILINOGEN 1.0 10/12/2009 1207   NITRITE NEGATIVE 08/04/2019 1907   LEUKOCYTESUR TRACE (A) 08/04/2019 1907   Sepsis Labs: @LABRCNTIP (procalcitonin:4,lacticidven:4)  ) Recent Results (from the past 240 hour(s))  Respiratory Panel by RT PCR (Flu A&B, Covid) - Nasopharyngeal Swab     Status: None   Collection Time: 08/04/19  7:24 PM   Specimen: Nasopharyngeal Swab  Result Value Ref Range Status   SARS Coronavirus 2 by RT PCR NEGATIVE NEGATIVE Final    Comment: (NOTE) SARS-CoV-2 target nucleic acids are NOT DETECTED. The SARS-CoV-2 RNA is generally detectable in upper respiratoy specimens during the acute phase of infection. The lowest concentration of SARS-CoV-2 viral copies this assay can detect is 131 copies/mL. A negative result does not preclude SARS-Cov-2 infection and should not be used as the sole basis for treatment or other patient management decisions. A negative result may occur with  improper  specimen collection/handling, submission of specimen other than nasopharyngeal swab, presence of viral mutation(s) within the areas targeted by this assay, and inadequate number of viral copies (<131 copies/mL). A negative result must be combined with clinical observations, patient history, and epidemiological information. The expected result is Negative. Fact Sheet for Patients:  PinkCheek.be Fact Sheet for Healthcare Providers:  GravelBags.it This test is not yet ap proved or cleared by the [place] FDA and  has been authorized for detection and/or diagnosis of SARS-CoV-2 by FDA under an Emergency Use Authorization (EUA). This EUA will remain  in effect (meaning this test can be used) for the duration of  the COVID-19 declaration under Section 564(b)(1) of the Act, 21 U.S.C. section 360bbb-3(b)(1), unless the authorization is terminated or revoked sooner.    Influenza A by PCR NEGATIVE NEGATIVE Final   Influenza B by PCR NEGATIVE NEGATIVE Final    Comment: (NOTE) The Xpert Xpress SARS-CoV-2/FLU/RSV assay is intended as an aid in  the diagnosis of influenza from Nasopharyngeal swab specimens and  should not be used as a sole basis for treatment. Nasal washings and  aspirates are unacceptable for Xpert Xpress SARS-CoV-2/FLU/RSV  testing. Fact Sheet for Patients: PinkCheek.be Fact Sheet for Healthcare Providers: GravelBags.it This test is not yet approved or cleared by the [place] FDA and  has been authorized for detection and/or diagnosis of SARS-CoV-2 by  FDA under an Emergency Use Authorization (EUA). This EUA will remain  in effect (meaning this test can be used) for the duration of the  Covid-19 declaration under Section 564(b)(1) of the Act, 21  U.S.C. section 360bbb-3(b)(1), unless the authorization is  terminated or revoked. Performed at [facility], [place] [address], [place], [place] [address]   MRSA PCR Screening     Status: None   Collection Time: 08/05/19 12:55 AM   Specimen: Nasal Mucosa; Nasopharyngeal  Result Value Ref Range Status   MRSA by PCR NEGATIVE NEGATIVE Final    Comment:        The GeneXpert MRSA Assay (FDA approved for NASAL specimens only), is one component of a comprehensive MRSA colonization surveillance program. It is not intended to diagnose MRSA infection nor to guide or monitor treatment for MRSA infections. Performed at [facility], [place] [address], [place],  [address]          Radiology Studies: CT Head Wo Contrast  Result Date: 08/04/2019 CLINICAL DATA:  Multiple recent falls. EXAM: CT HEAD WITHOUT CONTRAST TECHNIQUE: Contiguous axial images were  obtained from the base of the skull through the vertex without intravenous contrast. COMPARISON:  11/18/2007 FINDINGS: Brain: Prior right temporal craniectomy. Encephalomalacia in the right temporal and frontoparietal lobes. This is unchanged. There is atrophy and chronic small vessel disease changes. No acute intracranial abnormality. Specifically, no hemorrhage, hydrocephalus, mass lesion, acute infarction, or significant intracranial injury. Vascular: No hyperdense vessel or unexpected calcification. Skull: No acute calvarial abnormality. Sinuses/Orbits: Visualized paranasal sinuses and mastoids clear. Orbital soft tissues unremarkable. Other: None IMPRESSION: Prior right temporal craniectomy with underlying encephalomalacia. Atrophy, chronic microvascular disease. No acute intracranial abnormality. Electronically Signed   By: Rolm Baptise M.D.   On: 08/04/2019 20:39   CT Cervical Spine Wo Contrast  Result Date: 08/04/2019 CLINICAL DATA:  Multiple recent falls EXAM: CT CERVICAL SPINE WITHOUT CONTRAST TECHNIQUE: Multidetector CT imaging of the cervical spine was performed without intravenous contrast. Multiplanar CT image reconstructions were also generated. COMPARISON:  None. FINDINGS: Alignment: Normal Skull base and vertebrae: No  acute fracture. No primary bone lesion or focal pathologic process. Soft tissues and spinal canal: No prevertebral fluid or swelling. No visible canal hematoma. Disc levels:  Diffuse advanced degenerative disc and facet disease. Upper chest: Biapical scarring. Other: No acute findings IMPRESSION: Degenerative changes.  No acute bony abnormality. Electronically Signed   By: Rolm Baptise M.D.   On: 08/04/2019 20:41   CT PELVIS WO CONTRAST  Result Date: 08/04/2019 CLINICAL DATA:  Fall EXAM: CT PELVIS WITHOUT CONTRAST TECHNIQUE: Multidetector CT imaging of the pelvis was performed following the standard protocol without intravenous contrast. COMPARISON:  Plain films today FINDINGS:  Urinary Tract: Bladder wall thickening present. There is a 3 cm left lateral bladder wall diverticulum. Visualized distal ureters decompressed. Bowel: Colonic diverticulosis. No active diverticulitis or evidence of bowel obstruction. Moderate stool in the visualized colon. Vascular/Lymphatic: Heavily calcified aorta and iliac vessels. No aneurysm or adenopathy. Reproductive:  Prostate enlargement. Other:  No free fluid or free air. Musculoskeletal: And soft tissue calcifications Corticated bone noted in the soft tissues superior to the right greater trochanter. These are likely related to old avulsion fracture or soft tissue injury. No acute fracture. Moderate symmetric degenerative changes within the hips bilaterally with joint space narrowing and spurring. Degenerative changes in the visualized lower lumbar spine. IMPRESSION: Corticated bone fragments and soft tissue calcifications in the soft tissue superior to the right greater trochanter, likely related to old injury. No acute fracture seen. Moderate degenerative changes in the hips bilaterally. Colonic diverticulosis. Prostate prominence. Associated bladder wall thickening and left lateral bladder wall diverticulum. Electronically Signed   By: Rolm Baptise M.D.   On: 08/04/2019 20:55   DG Pelvis Portable  Result Date: 08/04/2019 CLINICAL DATA:  Pain following fall EXAM: PORTABLE PELVIS 1-2 VIEWS COMPARISON:  None. FINDINGS: There is no evidence of acute pelvic fracture or dislocation. There is evidence of prior avulsion along the greater trochanter with bony fragments in the lateral aspect of the elbow joint, chronic in appearance. There is moderate narrowing of the hip joints, slightly more severe on the right than on the left. No erosive change. Bones are osteoporotic. Multiple foci of vascular calcification noted in the distal aorta as well as in iliac and femoral artery branches. IMPRESSION: Prior avulsion along the greater trochanter on the right with  bony fragments in the lateral aspect of the right elbow joint, chronic in appearance. No acute appearing fracture or dislocation. Narrowing of each hip joint noted, more severe on the right than on the left. Bones osteoporotic. Multifocal arterial vascular calcification noted. Electronically Signed   By: Lowella Grip III M.D.   On: 08/04/2019 19:25   DG Chest Port 1 View  Result Date: 08/04/2019 CLINICAL DATA:  Pain following fall EXAM: PORTABLE CHEST 1 VIEW COMPARISON:  Chest radiograph October 12, 2009 and chest CT March 29, 2010 FINDINGS: There is patchy airspace opacity in the right base. There is a degree of underlying emphysematous change, better seen on prior CT. Heart is mildly enlarged with pulmonary vascularity normal. No adenopathy. There is aortic atherosclerosis. Bones are osteoporotic. IMPRESSION: Patchy airspace opacity consistent with pneumonia right base. Underlying emphysematous change. Mild cardiomegaly. Aortic Atherosclerosis (ICD10-I70.0). Electronically Signed   By: Lowella Grip III M.D.   On: 08/04/2019 19:27        Scheduled Meds: . carbamazepine  400 mg Oral BID  . irbesartan  150 mg Oral Daily   And  . hydrochlorothiazide  12.5 mg Oral Daily  . rosuvastatin  20  mg Oral q1800   Continuous Infusions: . ampicillin-sulbactam (UNASYN) IV 3 g (08/05/19 0939)     LOS: 1 day    Time spent: 86min  Domenic Polite, MD [facility]   08/05/2019, 10:58 AM

## 2019-08-05 NOTE — Evaluation (Signed)
Clinical/Bedside Swallow Evaluation Patient Details  Name: Richard Mitchell MRN: [id number] Date of Birth: Dec 12, 1940  Today's Date: 08/05/2019 Time: SLP Start Time (ACUTE ONLY): 81 SLP Stop Time (ACUTE ONLY): 1215 SLP Time Calculation (min) (ACUTE ONLY): 45 min  Past Medical History:  Past Medical History:  Diagnosis Date  . Anemia   . Cancer ([place])    [place]  . Carotid artery occlusion   . Dysphasia S/P CVA (cerebrovascular accident)   . Gait disturbance 11/12/2012  . History of subdural hematoma (post traumatic) Approx 1998  . Hypertension   . Keratosis 11/12/2012   x2  . Onychomycosis 11/12/2012   extreme neglect x 10  . Osteoporosis   . PAD (peripheral artery disease) ([place]) 11/12/2012   possible due to diminished pedal pulses  . Seizure disorder [facility])    Past Surgical History:  Past Surgical History:  Procedure Laterality Date  . CAROTID ENDARTERECTOMY Left 10-13-2009  . SUBDURAL HEMATOMA EVACUATION VIA CRANIOTOMY     HPI:  79yo male admitted 08/04/19 with generalized weakness and increasing falls. PMH: anemia, chronic dysphagia, CVA, gait disturbance, SDH s/p crani (1998), HTN, PAD, seizures   Assessment / Plan / Recommendation Clinical Impression  Pt seen at bedside for swallow evaluation. Pt known to ST services, having had MBS in May 2020 which revealed significant pharyngeal dysphagia with silent aspiration of multiple consistencies. Pt's wife was present during this assessment. Pt exhibits wet voice quality prior to po presentations, raising concern for secretion management. Pt has one lower tooth, and reports eating nutrigrain bars, applesauce, and boost at home. He verbalizes awareness of difficulty swallowing, with frequent coughing when eating and drinking, and needing to swallow multiple times to clear globus sensation.   Oral care was completed with suction. Pt was given trials of thin liquid and puree textures, and exhibited change in voice quality and cough  response following both trials. This presentation is in similar fashion to [place] report from May 2020, with possible deterioration of swallow function as evidenced by wet voice and cough response (no cough response documented following aspiration on that study). Pt does not appear to be safe for po intake based on history and current presentation. SLP provided education regarding risk of aspiration and anticipated deterioration of swallow function. Palliative Care team was discussed as a recommended option to facilitate establishment of appropriate goals of care, including decision making regarding po intake and PEG placement. Pt and wife were receptive to all of this information. MD called pt room and spoke with pt's wife on the phone. SLP left the room to allow them time to talk with physician.  SLP Visit Diagnosis: Dysphagia, unspecified (R13.10)    Aspiration Risk  Severe aspiration risk;Risk for inadequate nutrition/hydration    Diet Recommendation NPO   Medication Administration: Via alternative means    Other  Recommendations Oral Care Recommendations: Oral care QID   Follow up Recommendations 24 hour supervision/assistance      Frequency and Duration min 1 x/week  1 week;2 weeks       Prognosis Prognosis for Safe Diet Advancement: Guarded Barriers to Reach Goals: Severity of deficits;Time post onset      Swallow Study   General Date of Onset: 08/04/19 HPI: 79yo male admitted 08/04/19 with generalized weakness and increasing falls. PMH: anemia, chronic dysphagia, CVA, gait disturbance, SDH s/p crani (1998), HTN, PAD, seizures Type of Study: Bedside Swallow Evaluation Previous Swallow Assessment: MBS May 2020 - mild oral and a severe pharyngeal dysphagia, which appears to  be chronic and structural-based. silent aspiration before, during and after the swallow, thicker consistencies resulted in increased post-swallow residue, which were slow to clear the pharynx and cervical esophagus.  No compensatory positions were effective in clearing of boluses, or reducing aspiration risk Diet Prior to this Study: Dysphagia 3 (soft);Thin liquids Temperature Spikes Noted: No Respiratory Status: Nasal cannula History of Recent Intubation: No Behavior/Cognition: Alert;Cooperative;Pleasant mood Oral Cavity Assessment: Within Functional Limits Oral Care Completed by SLP: No Oral Cavity - Dentition: Missing dentition Vision: Functional for self-feeding Self-Feeding Abilities: Able to feed self Patient Positioning: Upright in bed Baseline Vocal Quality: Wet Volitional Cough: Strong Volitional Swallow: Able to elicit    Oral/Motor/Sensory Function Overall Oral Motor/Sensory Function: Generalized oral weakness   Ice Chips Ice chips: Not tested   Thin Liquid Thin Liquid: Impaired Presentation: Straw Pharyngeal  Phase Impairments: Multiple swallows;Cough - Immediate;Wet Vocal Quality;Suspected delayed Swallow    Nectar Thick Nectar Thick Liquid: Not tested   Honey Thick Honey Thick Liquid: Not tested   Puree Puree: Impaired Presentation: Spoon Oral Phase Functional Implications: Prolonged oral transit Pharyngeal Phase Impairments: Multiple swallows;Wet Vocal Quality;Cough - Immediate;Suspected delayed Swallow   Solid     Solid: Not tested     Dakarai Mcglocklin B. Quentin Ore, [facility], [place] Speech Language Pathologist Office: [phone] Pager: [phone]   Shonna Chock 08/05/2019,12:36 PM

## 2019-08-05 NOTE — Progress Notes (Signed)
Noticed patient has hematuria, small amount bright red blood. Then cardiac rhythm changed to A-fib at the beginning up to 130's, but now rate control less than 100's. Dr. Broadus John notified this matter. Will check EKG. HS [employer]

## 2019-08-05 NOTE — Progress Notes (Signed)
  Speech Language Pathology Treatment: Dysphagia  Patient Details Name: Richard Mitchell MRN: [id number] DOB: Dec 14, 1940 Today's Date: 08/05/2019 Time: 1240-1310 SLP Time Calculation (min) (ACUTE ONLY): 30 min  Assessment / Plan / Recommendation Clinical Impression  Following MD phone conversation with pt's wife, SLP returned to provide education. Pt is now DNR, and pt/family desire continued PO intake with known risks of aspiration. Wife was receptive to education regarding the importance of regular oral care to minimize bacterial load. SLP showed wife how to use suction to provide oral care. Oral care is recommended before and after PO intake due to HIGH aspiration risk. She was encouraged to use green swabs ONCE, then throw them away, not allowing used swabs to soak in water. Pt should also be fully awake and upright during and after meals, and clear his throat intermittently - especially when voice quality is noted to be wet, as this strongly indicates secretions (or other material) within the upper airway to the level of the vocal folds). Pt exhibits wet voice quality even in the absence of po trials.  Pt and wife were educated regarding results and MBS in May 2020, and suspected deterioration of swallow function since that time based on presentation today. SLP discussed the role of Palliative Care to assist with decision making and to have hard questions answered. With known risks of aspiration, pt/wife would like to continue with po intake. Finely chopped solids were preferable to them over pureed textures. Pt is at significantly high aspiration risk with any texture, and any liquid. They voiced understanding of this. Safe swallow precautions were posted at [facility]. SLP will follow up for continuing education, and will provide written information regarding continuing po intake with known aspiration risk.     HPI HPI: 79yo male admitted 08/04/19 with generalized weakness and increasing falls. PMH:  anemia, chronic dysphagia, CVA, gait disturbance, SDH s/p crani (1998), HTN, PAD, seizures      SLP Plan  Continue with current plan of care       Recommendations  Diet recommendations: Dysphagia 2 (fine chop);Thin liquid;Dysphagia 1 (puree) Liquids provided via: Straw;Cup Medication Administration: Crushed with puree Supervision: Patient able to self feed;Staff to assist with self feeding Compensations: Minimize environmental distractions;Slow rate;Small sips/bites;Clear throat intermittently Postural Changes and/or Swallow Maneuvers: Seated upright 90 degrees;Upright 30-60 min after meal                Oral Care Recommendations: Oral care QID;Oral care before and after PO Follow up Recommendations: 24 hour supervision/assistance SLP Visit Diagnosis: Dysphagia, unspecified (R13.10) Plan: Continue with current plan of care       [place]. Quentin Ore, [facility], [place] Speech Language Pathologist Office: [phone] Pager: [phone]   Shonna Chock 08/05/2019, 1:47 PM

## 2019-08-05 NOTE — Evaluation (Signed)
Occupational Therapy Evaluation Patient Details Name: Richard Mitchell MRN: [id number] DOB: Dec 22, 1940 Today's Date: 08/05/2019    History of Present Illness Pt is an 79 year old man admitted 08/04/19 with generalized weakness and falls. PMH: anemia, chronic dysphagia, CVA, SDH s/p evacuation and craniotomy, PAD, seizures.   Clinical Impression   Pt typically ambulates without a device except outside when he uses a cane. He is independent in ADL and reliant on his wife for IADL. Pt presents with generalized weakness, R UE tremor, poor standing balance and impaired cognition. He requires 2 person assist to stand and set up to max assist for ADL. Recommending intensive rehab in SNF prior to return home. Will follow acutely.    Follow Up Recommendations  CIR    Equipment Recommendations  3 in 1 bedside commode;Tub/shower bench    Recommendations for Other Services Rehab consult     Precautions / Restrictions Precautions Precautions: Fall Restrictions Weight Bearing Restrictions: No      Mobility Bed Mobility Overal bed mobility: Needs Assistance Bed Mobility: Supine to Sit     Supine to sit: +2 for physical assistance;Mod assist     General bed mobility comments: assist for hips to EOB with bed pad and to raise trunk  Transfers Overall transfer level: Needs assistance Equipment used: Rolling walker (2 wheeled) Transfers: Sit to/from Stand Sit to Stand: +2 physical assistance;Mod assist         General transfer comment: cues for hand placement, assist to rise and steady, pt with posterior and R lean upon initially, physical assist needed to correct    Balance Overall balance assessment: Needs assistance   Sitting balance-Leahy Scale: Fair     Standing balance support: Bilateral upper extremity supported Standing balance-Leahy Scale: Poor                             ADL either performed or assessed with clinical judgement   ADL Overall ADL's : Needs  assistance/impaired Eating/Feeding: NPO   Grooming: Minimal assistance;Standing   Upper Body Bathing: Set up;Sitting   Lower Body Bathing: Sit to/from stand;Maximal assistance;+2 for physical assistance   Upper Body Dressing : Minimal assistance;Sitting   Lower Body Dressing: Maximal assistance;+2 for physical assistance;Sit to/from stand   Toilet Transfer: Minimal assistance;+2 for safety/equipment;Ambulation;RW           Functional mobility during ADLs: Minimal assistance;+2 for physical assistance;Rolling walker;Cueing for safety       Vision Baseline Vision/History: Wears glasses Wears Glasses: Reading only Patient Visual Report: No change from baseline       Perception     Praxis      Pertinent Vitals/Pain Pain Assessment: Faces Pain Score: 2  Faces Pain Scale: Hurts a little bit Pain Location: bottoms of feet Pain Descriptors / Indicators: Sore Pain Intervention(s): Repositioned     Hand Dominance Right   Extremity/Trunk Assessment Upper Extremity Assessment Upper Extremity Assessment: RUE deficits/detail;LUE deficits/detail RUE Deficits / Details: mild tremor LUE Deficits / Details: weaker than R, difficult to assess due to inability to comply with requirements of MMT   Lower Extremity Assessment Lower Extremity Assessment: Defer to PT evaluation   Cervical / Trunk Assessment Cervical / Trunk Assessment: Kyphotic   Communication Communication Communication: HOH   Cognition Arousal/Alertness: Awake/alert Behavior During Therapy: WFL for tasks assessed/performed Overall Cognitive Status: Impaired/Different from baseline Area of Impairment: Following commands;Safety/judgement;Problem solving  Following Commands: Follows one step commands with increased time;Follows multi-step commands inconsistently(needs multimodal cues) Safety/Judgement: Decreased awareness of safety;Decreased awareness of deficits   Problem  Solving: Slow processing;Decreased initiation;Difficulty sequencing;Requires verbal cues;Requires tactile cues     General Comments       Exercises     Shoulder Instructions      Home Living Family/patient expects to be discharged to:: Private residence Living Arrangements: Spouse/significant other Available Help at Discharge: Family;Available 24 hours/day Type of Home: House Home Access: Stairs to enter [employer] of Steps: 2 Entrance Stairs-Rails: None Home Layout: One level     Bathroom Shower/Tub: Teacher, early years/pre: Handicapped height     Home Equipment: Environmental consultant - 2 wheels;Cane - single point          Prior Functioning/Environment Level of Independence: Independent with assistive device(s)        Comments: does not walk with a device inside, uses a cane outside, but owns a walker        OT Problem List: Decreased strength;Decreased activity tolerance;Impaired balance (sitting and/or standing);Decreased cognition;Decreased safety awareness;Decreased knowledge of use of DME or AE;Pain      OT Treatment/Interventions: Self-care/ADL training;DME and/or AE instruction;Cognitive remediation/compensation;Patient/family education;Balance training;Therapeutic activities    OT Goals(Current goals can be found in the care plan section) Acute Rehab OT Goals Patient Stated Goal: to get stronger and return home OT Goal Formulation: With patient Time For Goal Achievement: 08/19/19 Potential to Achieve Goals: Good ADL Goals Pt Will Perform Grooming: with min guard assist;standing Pt Will Perform Lower Body Bathing: with min guard assist;with adaptive equipment;sit to/from stand Pt Will Perform Lower Body Dressing: with min guard assist;with adaptive equipment;sit to/from stand Pt Will Transfer to Toilet: with min guard assist;ambulating;bedside commode Pt Will Perform Toileting - Clothing Manipulation and hygiene: with min guard assist;sit  to/from stand Pt Will Perform Tub/Shower Transfer: with min assist;ambulating;tub bench;rolling walker  OT Frequency: Min 2X/week   Barriers to D/C:            Co-evaluation PT/OT/SLP Co-Evaluation/Treatment: Yes Reason for Co-Treatment: For patient/therapist safety   OT goals addressed during session: ADL's and self-care      AM-PAC OT "6 Clicks" Daily Activity     Outcome Measure Help from another person eating meals?: None Help from another person taking care of personal grooming?: A Little Help from another person toileting, which includes using toliet, bedpan, or urinal?: A Lot Help from another person bathing (including washing, rinsing, drying)?: A Lot Help from another person to put on and taking off regular upper body clothing?: A Little Help from another person to put on and taking off regular lower body clothing?: Total 6 Click Score: 15   End of Session Equipment Utilized During Treatment: Gait belt;Rolling walker;Oxygen(2L) Nurse Communication: Mobility status  Activity Tolerance: Patient tolerated treatment well Patient left: in chair;with call bell/phone within reach;with chair alarm set  OT Visit Diagnosis: Unsteadiness on feet (R26.81);Other abnormalities of gait and mobility (R26.89);Muscle weakness (generalized) (M62.81);Other symptoms and signs involving cognitive function;History of falling (Z91.81)                Time: [id number] OT Time Calculation (min): 36 min Charges:  OT General Charges $OT Visit: 1 Visit OT Evaluation $OT Eval Moderate Complexity: 1 Mod  Nestor Lewandowsky, OTR/L Acute Rehabilitation Services Pager: [phone] Office: [phone] Malka So 08/05/2019, 10:36 AM

## 2019-08-05 NOTE — Evaluation (Signed)
Physical Therapy Evaluation Patient Details Name: Richard Mitchell MRN: [id number] DOB: 04/08/1941 Today's Date: 08/05/2019   History of Present Illness  Pt is an 79 year old man admitted 08/04/19 with generalized weakness and falls. PMH: anemia, chronic dysphagia, CVA, SDH s/p evacuation and craniotomy, PAD, seizures.    Clinical Impression  Pt admitted with above. Pt demonstrating regression in functional mobility and impaired processing, sequencing, and multi-step command following. Pt has fallen 2x in the last 2 days. 2 weeks ago pt was amb indep and completing most ADLs without assist. Pt now requiring assistx2. Recommend CIR upon d/c to achieve safe supervision level of function for safe transition home with spouse. Acute PT to cont to follow.    Follow Up Recommendations CIR    Equipment Recommendations  None recommended by PT    Recommendations for Other Services       Precautions / Restrictions Precautions Precautions: Fall Restrictions Weight Bearing Restrictions: No      Mobility  Bed Mobility Overal bed mobility: Needs Assistance Bed Mobility: Supine to Sit     Supine to sit: Mod assist;+2 for physical assistance     General bed mobility comments: assist for hips to EOB with bed pad and to raise trunk  Transfers Overall transfer level: Needs assistance Equipment used: Rolling walker (2 wheeled) Transfers: Sit to/from Stand Sit to Stand: Mod assist;+2 physical assistance         General transfer comment: cues for hand placement, assist to rise and steady, pt with posterior and R lean upon initially, physical assist needed to correct  Ambulation/Gait Ambulation/Gait assistance: Mod assist;+2 safety/equipment Gait Distance (Feet): 20 Feet Assistive device: Rolling walker (2 wheeled) Gait Pattern/deviations: Step-through pattern;Decreased stride length;Shuffle;Narrow base of support Gait velocity: slow Gait velocity interpretation: <1.8 ft/sec, indicate of  risk for recurrent falls General Gait Details: pt very guarded with short shuffled steps in walker, max tactile and verbal cues to not push walker to far out in front of himself and to stay in the walker  Stairs            Wheelchair Mobility    Modified Rankin (Stroke Patients Only)       Balance Overall balance assessment: Needs assistance Sitting-balance support: Bilateral upper extremity supported Sitting balance-Leahy Scale: Fair Sitting balance - Comments: no physical assist needed to maintain EOB sitting   Standing balance support: Bilateral upper extremity supported Standing balance-Leahy Scale: Poor Standing balance comment: dependent on RW                             Pertinent Vitals/Pain Pain Assessment: Faces Faces Pain Scale: Hurts a little bit Pain Location: ribs hurt when coughing Pain Descriptors / Indicators: Sore Pain Intervention(s): Monitored during session    Home Living Family/patient expects to be discharged to:: Private residence Living Arrangements: Spouse/significant other Available Help at Discharge: Family;Available 24 hours/day Type of Home: House Home Access: Stairs to enter Entrance Stairs-Rails: None Entrance Stairs-Number of Steps: 2 Home Layout: One level Home Equipment: Walker - 2 wheels;Cane - single point      Prior Function Level of Independence: Independent with assistive device(s)         Comments: does not walk with a device inside, uses a cane outside, but owns a walker     Hand Dominance   Dominant Hand: Right    Extremity/Trunk Assessment   Upper Extremity Assessment Upper Extremity Assessment: Defer to OT evaluation  Lower Extremity Assessment Lower Extremity Assessment: Generalized weakness    Cervical / Trunk Assessment Cervical / Trunk Assessment: Kyphotic  Communication   Communication: HOH  Cognition Arousal/Alertness: Awake/alert Behavior During Therapy: WFL for tasks  assessed/performed Overall Cognitive Status: Impaired/Different from baseline Area of Impairment: Following commands;Safety/judgement;Problem solving                       Following Commands: Follows one step commands with increased time;Follows multi-step commands inconsistently(needs multimodal cues) Safety/Judgement: Decreased awareness of safety;Decreased awareness of deficits   Problem Solving: Slow processing;Decreased initiation;Difficulty sequencing;Requires verbal cues;Requires tactile cues General Comments: pt with significant difficulty trying to use phone, unable to recall phone number, and unable to follow commands for appropriate MMT      General Comments General comments (skin integrity, edema, etc.): SpO2 droped to 86% on RA, 2lO2 via [place] placed back on, pt at 91-93% t/o session    Exercises     Assessment/Plan    PT Assessment Patient needs continued PT services  PT Problem List Decreased strength;Decreased range of motion;Decreased activity tolerance;Decreased balance;Decreased mobility;Decreased coordination;Decreased cognition;Decreased knowledge of use of DME;Decreased safety awareness       PT Treatment Interventions DME instruction;Stair training;Gait training;Functional mobility training;Therapeutic activities;Therapeutic exercise;Balance training    PT Goals (Current goals can be found in the Care Plan section)  Acute Rehab PT Goals Patient Stated Goal: to get stronger and return home PT Goal Formulation: With patient Time For Goal Achievement: 08/19/19 Potential to Achieve Goals: Good    Frequency Min 3X/week   Barriers to discharge Decreased caregiver support wife is 81 yo    Co-evaluation PT/OT/SLP Co-Evaluation/Treatment: Yes Reason for Co-Treatment: Complexity of the patient's impairments (multi-system involvement) PT goals addressed during session: Mobility/safety with mobility         AM-PAC PT "6 Clicks" Mobility  Outcome  Measure Help needed turning from your back to your side while in a flat bed without using bedrails?: A Lot Help needed moving from lying on your back to sitting on the side of a flat bed without using bedrails?: A Lot Help needed moving to and from a bed to a chair (including a wheelchair)?: A Lot Help needed standing up from a chair using your arms (e.g., wheelchair or bedside chair)?: A Lot Help needed to walk in hospital room?: A Lot Help needed climbing 3-5 steps with a railing? : Total 6 Click Score: 11    End of Session Equipment Utilized During Treatment: Gait belt;Oxygen(2Lo2 via ) Activity Tolerance: Patient tolerated treatment well Patient left: in chair;with call bell/phone within reach;with chair alarm set Nurse Communication: Mobility status PT Visit Diagnosis: Unsteadiness on feet (R26.81);Other abnormalities of gait and mobility (R26.89);Muscle weakness (generalized) (M62.81)    Time: 0825-0900 PT Time Calculation (min) (ACUTE ONLY): 35 min   Charges:   PT Evaluation $PT Eval Moderate Complexity: 1 Mod          Kittie Plater, PT, DPT Acute Rehabilitation Services Pager #: [phone] Office #: [phone]   Berline Lopes 08/05/2019, 1:36 PM

## 2019-08-06 DIAGNOSIS — Z66 Do not resuscitate: Secondary | ICD-10-CM

## 2019-08-06 DIAGNOSIS — R531 Weakness: Secondary | ICD-10-CM

## 2019-08-06 DIAGNOSIS — Z515 Encounter for palliative care: Secondary | ICD-10-CM

## 2019-08-06 DIAGNOSIS — R627 Adult failure to thrive: Secondary | ICD-10-CM

## 2019-08-06 LAB — CBC
HCT: 34.4 % — ABNORMAL LOW (ref 39.0–52.0)
Hemoglobin: 11.2 g/dL — ABNORMAL LOW (ref 13.0–17.0)
MCH: 31.1 pg (ref 26.0–34.0)
MCHC: 32.6 g/dL (ref 30.0–36.0)
MCV: 95.6 fL (ref 80.0–100.0)
Platelets: 208 10*3/uL (ref 150–400)
RBC: 3.6 MIL/uL — ABNORMAL LOW (ref 4.22–5.81)
RDW: 13.6 % (ref 11.5–15.5)
WBC: 8.9 10*3/uL (ref 4.0–10.5)
nRBC: 0 % (ref 0.0–0.2)

## 2019-08-06 LAB — BASIC METABOLIC PANEL
Anion gap: 9 (ref 5–15)
BUN: 14 mg/dL (ref 8–23)
CO2: 21 mmol/L — ABNORMAL LOW (ref 22–32)
Calcium: 8.9 mg/dL (ref 8.9–10.3)
Chloride: 108 mmol/L (ref 98–111)
Creatinine, Ser: 0.81 mg/dL (ref 0.61–1.24)
GFR calc Af Amer: >60 mL/min (ref >60)
GFR calc non Af Amer: >60 mL/min (ref >60)
Glucose, Bld: 92 mg/dL (ref 70–99)
Potassium: 4.4 mmol/L (ref 3.5–5.1)
Sodium: 138 mmol/L (ref 135–145)

## 2019-08-06 LAB — URINE CULTURE

## 2019-08-06 NOTE — NC FL2 (Signed)
[place] MEDICAID FL2 LEVEL OF CARE SCREENING TOOL     IDENTIFICATION  Patient Name: Richard Mitchell Birthdate: [birth year] Sex: male Admission Date (Current Location): 08/04/2019  [facility] and [place] Number:  Herbalist and Address:  [facility] [place] [place] [address] [place], [place] [address]      Provider Number: [id number]  Attending Physician Name and Address:  Caren Griffins, MD  Relative Name and Phone Number:  [name] (spouse) [phone]    Current Level of Care: Hospital Recommended Level of Care: [place] Prior Approval Number:    Date Approved/Denied:   PASRR Number: [id number]  Discharge Plan: SNF    Current Diagnoses: Patient Active Problem List   Diagnosis Date Noted  . Dysphagia 08/05/2019  . Generalized weakness 08/05/2019  . Fall at home, initial encounter 08/05/2019  . Bradycardia 08/05/2019  . Aspiration pneumonia ([place]) 08/04/2019  . Essential hypertension 01/21/2018  . Hyperlipidemia 01/21/2018  . Aftercare following surgery of the circulatory system, [place] 03/03/2014  . Occlusion and stenosis of carotid artery with cerebral infarction 03/03/2014  . PAD (peripheral artery disease) ([place])   . Onychomycosis   . Keratosis   . Occlusion and stenosis of carotid artery without mention of cerebral infarction 02/24/2013  . Gait disturbance 11/12/2012    Orientation RESPIRATION BLADDER Height & Weight     Self, Time, Situation, Place  Normal Incontinent, External catheter Weight: 168 lb (76.2 kg) Height:  6\' 2"  (188 cm)  BEHAVIORAL SYMPTOMS/MOOD NEUROLOGICAL BOWEL NUTRITION STATUS      Continent Diet(see discharge summary)  AMBULATORY STATUS COMMUNICATION OF NEEDS Skin   Extensive Assist Verbally Normal                       Personal Care Assistance Level of Assistance  Bathing, Dressing, Total care, Feeding Bathing Assistance: Limited assistance Feeding assistance: Independent Dressing  Assistance: Limited assistance Total Care Assistance: Maximum assistance   Functional Limitations Info  Sight, Hearing, Speech Sight Info: Adequate Hearing Info: Adequate Speech Info: Adequate    SPECIAL CARE FACTORS FREQUENCY  PT (By licensed PT), OT (By licensed OT)     PT Frequency: min 5x weekly OT Frequency: min 5x weekly            Contractures Contractures Info: Not present    Additional Factors Info  Code Status, Allergies Code Status Info: DNR Allergies Info: No Known Allergies           Current Medications (08/06/2019):  This is the current hospital active medication list Current Facility-Administered Medications  Medication Dose Route Frequency Provider Last Rate Last Admin  . acetaminophen (TYLENOL) tablet 650 mg  650 mg Oral Q6H PRN Shela Leff, MD   650 mg at 08/06/19 [id number]   Or  . acetaminophen (TYLENOL) suppository 650 mg  650 mg Rectal Q6H PRN Shela Leff, MD      . Ampicillin-Sulbactam (UNASYN) 3 g in sodium chloride 0.9 % 100 mL IVPB  3 g Intravenous Q6H Bryk, Veronda P, RPH 200 mL/hr at 08/06/19 1010 3 g at 08/06/19 1010  . carbamazepine (TEGRETOL) tablet 400 mg  400 mg Oral BID Shela Leff, MD   400 mg at 08/06/19 0907  . enoxaparin (LOVENOX) injection 40 mg  40 mg Subcutaneous Q24H Domenic Polite, MD   40 mg at 08/05/19 1243  . hydrALAZINE (APRESOLINE) injection 5 mg  5 mg Intravenous Q4H PRN Shela Leff, MD      .  hydrochlorothiazide (HYDRODIURIL) tablet 12.5 mg  12.5 mg Oral Daily Einar Grad, RPH   12.5 mg at 08/06/19 [id number]  . irbesartan (AVAPRO) tablet 150 mg  150 mg Oral Daily Shela Leff, MD   150 mg at 08/06/19 0907  . rosuvastatin (CRESTOR) tablet 20 mg  20 mg Oral q1800 Shela Leff, MD   20 mg at 08/05/19 1744     Discharge Medications: Please see discharge summary for a list of discharge medications.  Relevant Imaging Results:  Relevant Lab Results:   Additional Information SSN:  [id number]  Alberteen Sam, LCSW

## 2019-08-06 NOTE — Progress Notes (Signed)
Physical Therapy Treatment Patient Details Name: Richard Mitchell MRN: [id number] DOB: [birth year] Today's Date: 08/06/2019    History of Present Illness Pt is an 79 year old man admitted 08/04/19 with generalized weakness and falls. PMH: anemia, chronic dysphagia, CVA, SDH s/p evacuation and craniotomy, PAD, seizures.    PT Comments    Patient seen for mobility progression. Pt is making progress toward PT goals and tolerated session well. Pt does present with impaired balance and requires mod/max A for functional transfer training and min A (+2 for chair follow) for gait training. Pt tolerated increased distance this session and grossly limited by painful feet vs fatigue. Given pt's impaired balance and history of recurrent falls continue to recommend post acute rehab for further skilled PT services to maximize independence and safety with mobility.     Follow Up Recommendations  CIR     Equipment Recommendations  None recommended by PT    Recommendations for Other Services       Precautions / Restrictions Precautions Precautions: Fall Restrictions Weight Bearing Restrictions: No    Mobility  Bed Mobility Overal bed mobility: Needs Assistance Bed Mobility: Supine to Sit     Supine to sit: Min assist;HOB elevated     General bed mobility comments: cues for sequencing and use of rail; assist to elevate trunk into sitting  Transfers Overall transfer level: Needs assistance Equipment used: Rolling walker (2 wheeled) Transfers: Sit to/from Stand Sit to Stand: Mod assist;Max assist         General transfer comment: mod A to power up into partial standing and then increased assist to achieve full standing and to gain/maintain balance due to heavy posterior bias; cues for positioning of BOS and assist to weight shift  Ambulation/Gait Ambulation/Gait assistance: Min assist;+2 safety/equipment(chair follow) Gait Distance (Feet): 65 Feet Assistive device: Rolling walker (2  wheeled) Gait Pattern/deviations: Step-through pattern;Decreased stride length;Trunk flexed Gait velocity: decreased   General Gait Details: cues for upright posture/forward gaze and maintaining safe proximity to RW; +2 for chair follow not physical assistance; min A for balance    Stairs             Wheelchair Mobility    Modified Rankin (Stroke Patients Only)       Balance Overall balance assessment: Needs assistance Sitting-balance support: Bilateral upper extremity supported Sitting balance-Leahy Scale: Fair     Standing balance support: Bilateral upper extremity supported Standing balance-Leahy Scale: Poor Standing balance comment: dependent on RW                            Cognition Arousal/Alertness: Awake/alert Behavior During Therapy: WFL for tasks assessed/performed Overall Cognitive Status: No family/caregiver present to determine baseline cognitive functioning Area of Impairment: Safety/judgement;Problem solving                         Safety/Judgement: Decreased awareness of safety;Decreased awareness of deficits   Problem Solving: Slow processing;Difficulty sequencing;Requires verbal cues General Comments: pt attempted to stand without therapist close by despite multiple falls recently       Exercises      General Comments        Pertinent Vitals/Pain Pain Assessment: Faces Faces Pain Scale: Hurts a little bit Pain Location: ribs hurt when coughing and feet with weight bearing Pain Descriptors / Indicators: Discomfort Pain Intervention(s): Monitored during session;Premedicated before session;Repositioned    Home Living  Prior Function            PT Goals (current goals can now be found in the care plan section) Progress towards PT goals: Progressing toward goals    Frequency    Min 3X/week      PT Plan Current plan remains appropriate    Co-evaluation               AM-PAC PT "6 Clicks" Mobility   Outcome Measure  Help needed turning from your back to your side while in a flat bed without using bedrails?: A Little Help needed moving from lying on your back to sitting on the side of a flat bed without using bedrails?: A Little Help needed moving to and from a bed to a chair (including a wheelchair)?: A Lot Help needed standing up from a chair using your arms (e.g., wheelchair or bedside chair)?: A Lot Help needed to walk in hospital room?: A Little Help needed climbing 3-5 steps with a railing? : Total 6 Click Score: 14    End of Session Equipment Utilized During Treatment: Gait belt Activity Tolerance: Patient tolerated treatment well Patient left: in chair;with call bell/phone within reach;with chair alarm set Nurse Communication: Mobility status PT Visit Diagnosis: Unsteadiness on feet (R26.81);Other abnormalities of gait and mobility (R26.89);Muscle weakness (generalized) (M62.81)     Time: [id number] PT Time Calculation (min) (ACUTE ONLY): 26 min  Charges:  $Gait Training: 23-37 mins                     Earney Navy, PTA Acute Rehabilitation Services Pager: [phone] Office: [phone]     Darliss Cheney 08/06/2019, 10:52 AM

## 2019-08-06 NOTE — Progress Notes (Signed)
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  Over the last several days prior to admission wife reported that he has been falling very often.  He has a history of dysphagia from prior stroke but seems to have gotten a little bit worse with more coughing recently.  Chest x-ray on admission showed right lung base opacity concerning for aspiration.  Subjective / 24h Interval events: He is doing well this morning, tells me that his left side of the chest at the rib cage is hurting.  Denies any shortness of breath.  Assessment & Plan: Principal Problem Aspiration pneumonia with acute hypoxic respiratory failure -Initially noted to be mildly hypoxemic, he was able to be weaned off of oxygen and currently on room air -He has been placed on Unasyn, continue, today day 3 and plan for 7 days total -Blood cultures without growth, urine culture with multiple species.  SARS-CoV-2 was negative.  Active Problems Chronic dysphagia, history of CVA -Speech pathology evaluated patient, recommended dysphagia to (fine chopped) with thin liquids -Palliative has also been consulted  Generalized weakness, recent falls -Likely due to acute illness/pneumonia, head CT, C-spine, pelvis x-ray negative for fractures -PT recommending CIR, admission coordinator from [place] [facility] recommends SNF.  Social worker consulted.  Sinus bradycardia, first-degree AV block, paroxysmal A. fib -Alternating between heart rate in the 40s to 90s-100.  Asymptomatic -Not a candidate for anticoagulation due to recurrent  falls  Normocytic anemia, likely of chronic illness -Hemoglobin stable, monitor, no bleeding  Essential hypertension -Continue home irbesartan/HCTZ  Hyperlipidemia -Continue statin  Seizure disorder -Stable, continue Tegretol  Scheduled Meds: . carbamazepine  400 mg Oral BID  . enoxaparin (LOVENOX) injection  40 mg Subcutaneous Q24H  . hydrochlorothiazide  12.5 mg Oral Daily  . irbesartan  150 mg Oral Daily  . rosuvastatin  20 mg Oral q1800   Continuous Infusions: . ampicillin-sulbactam (UNASYN) IV 3 g (08/06/19 0353)   PRN Meds:.acetaminophen **OR** acetaminophen, hydrALAZINE  DVT prophylaxis: Lovenox Code Status: DNR Family Communication: Discussed with patient Patient admitted from: Home Anticipated d/c place: Skilled nursing Barriers to d/c: SNF bed, heart rate control, palliative evaluation pending  Consultants:  Palliative care  Procedures:  none  Microbiology  none  Antimicrobials: Unasyn     Objective: Vitals:   08/06/19 0300 08/06/19 0407 08/06/19 0659 08/06/19 0818  BP:  108/78 135/83   Pulse: (!) 104  (!) 102   Resp: (!) 27  16 18   Temp:  (!) 97 F (36.1 C) (!) 97.5 F (36.4 C)   TempSrc:  Oral Axillary   SpO2: 94%  92%   Weight:      Height:        Intake/Output Summary (Last 24 hours) at 08/06/2019 0954 Last data filed at 08/05/2019 2339 Gross per 24 hour  Intake 363.79 ml  Output 1900 ml  Net -1536.21 ml   Filed Weights   08/04/19 2300 08/05/19 0023  Weight: 72.1 kg 76.2 kg    Examination:  Constitutional: NAD, cachectic appearing Eyes: no scleral icterus ENMT: Mucous membranes are moist.  Neck: normal, supple Respiratory: Diminished at the bases but overall  clear to auscultation bilaterally, no wheezing, no crackles. Normal respiratory effort. No accessory muscle use.  Cardiovascular: Regular rate and rhythm, 3/6 SEM, no edema Abdomen: non distended, no tenderness. Bowel sounds positive.  Musculoskeletal: no clubbing /  cyanosis.  Skin: no rashes Neurologic: CN 2-12 grossly intact. Strength 5/5 in all 4.  Psychiatric: Normal judgment and insight. Alert and oriented x 3. Normal mood.    Data Reviewed: I have independently reviewed following labs and imaging studies   CBC: Recent Labs  Lab 08/04/19 1905 08/05/19 0211 08/06/19 0215  WBC 13.0* 11.0* 8.9  NEUTROABS 10.7*  --   --   HGB 10.8* 10.3* 11.2*  HCT 34.3* 31.9* 34.4*  MCV 99.4 96.4 95.6  PLT 199 186 [id number]   Basic Metabolic Panel: Recent Labs  Lab 08/04/19 1905 08/06/19 0215  NA 135 138  K 4.9 4.4  CL 102 108  CO2 23 21*  GLUCOSE 118* 92  BUN 33* 14  CREATININE 1.17 0.81  CALCIUM 8.9 8.9   Liver Function Tests: Recent Labs  Lab 08/04/19 1905  AST 17  ALT 15  ALKPHOS 66  BILITOT 0.4  PROT 6.8  ALBUMIN 3.8   Coagulation Profile: No results for input(s): INR, PROTIME in the last 168 hours. HbA1C: No results for input(s): HGBA1C in the last 72 hours. CBG: No results for input(s): GLUCAP in the last 168 hours.  Recent Results (from the past 240 hour(s))  Urine Culture     Status: Abnormal   Collection Time: 08/04/19  7:10 PM   Specimen: Urine, Random  Result Value Ref Range Status   Specimen Description URINE, RANDOM  Final   Special Requests   Final    NONE Performed at [facility], [address], [place], [place] [address]    Culture MULTIPLE SPECIES PRESENT, SUGGEST RECOLLECTION (A)  Final   Report Status 08/06/2019 FINAL  Final  Respiratory Panel by RT PCR (Flu A&B, Covid) - Nasopharyngeal Swab     Status: None   Collection Time: 08/04/19  7:24 PM   Specimen: Nasopharyngeal Swab  Result Value Ref Range Status   SARS Coronavirus 2 by RT PCR NEGATIVE NEGATIVE Final    Comment: (NOTE) SARS-CoV-2 target nucleic acids are NOT DETECTED. The SARS-CoV-2 RNA is generally detectable in upper respiratoy specimens during the acute phase of infection. The lowest concentration of SARS-CoV-2 viral copies this assay  can detect is 131 copies/mL. A negative result does not preclude SARS-Cov-2 infection and should not be used as the sole basis for treatment or other patient management decisions. A negative result may occur with  improper specimen collection/handling, submission of specimen other than nasopharyngeal swab, presence of viral mutation(s) within the areas targeted by this assay, and inadequate number of viral copies (<131 copies/mL). A negative result must be combined with clinical observations, patient history, and epidemiological information. The expected result is Negative. Fact Sheet for Patients:  PinkCheek.be Fact Sheet for Healthcare Providers:  GravelBags.it This test is not yet ap proved or cleared by the [place] FDA and  has been authorized for detection and/or diagnosis of SARS-CoV-2 by FDA under an Emergency Use Authorization (EUA). This EUA will remain  in effect (meaning this test can be used) for the duration of the COVID-19 declaration under Section 564(b)(1) of the Act, 21 U.S.C. section 360bbb-3(b)(1), unless the authorization is terminated or revoked sooner.    Influenza A by PCR NEGATIVE NEGATIVE Final   Influenza B by PCR NEGATIVE NEGATIVE Final  Comment: (NOTE) The Xpert Xpress SARS-CoV-2/FLU/RSV assay is intended as an aid in  the diagnosis of influenza from Nasopharyngeal swab specimens and  should not be used as a sole basis for treatment. Nasal washings and  aspirates are unacceptable for Xpert Xpress SARS-CoV-2/FLU/RSV  testing. Fact Sheet for Patients: PinkCheek.be Fact Sheet for Healthcare Providers: GravelBags.it This test is not yet approved or cleared by the [place] FDA and  has been authorized for detection and/or diagnosis of SARS-CoV-2 by  FDA under an Emergency Use Authorization (EUA). This EUA will remain  in effect  (meaning this test can be used) for the duration of the  Covid-19 declaration under Section 564(b)(1) of the Act, 21  U.S.C. section 360bbb-3(b)(1), unless the authorization is  terminated or revoked. Performed at [facility], [place] [address], [place], [place] [address]   Blood culture (routine x 2)     Status: None (Preliminary result)   Collection Time: 08/04/19  8:58 PM   Specimen: BLOOD  Result Value Ref Range Status   Specimen Description BLOOD RIGHT ANTECUBITAL  Final   Special Requests   Final    BOTTLES DRAWN AEROBIC AND ANAEROBIC Blood Culture adequate volume   Culture   Final    NO GROWTH < 24 HOURS Performed at [facility], [place] [address], [place], [place] [address]    Report Status PENDING  Incomplete  Blood culture (routine x 2)     Status: None (Preliminary result)   Collection Time: 08/04/19  9:47 PM   Specimen: BLOOD RIGHT HAND  Result Value Ref Range Status   Specimen Description BLOOD RIGHT HAND  Final   Special Requests   Final    BOTTLES DRAWN AEROBIC AND ANAEROBIC Blood Culture adequate volume   Culture   Final    NO GROWTH < 24 HOURS Performed at [facility], [place] [address], [place], [place] [address]    Report Status PENDING  Incomplete  MRSA PCR Screening     Status: None   Collection Time: 08/05/19 12:55 AM   Specimen: Nasal Mucosa; Nasopharyngeal  Result Value Ref Range Status   MRSA by PCR NEGATIVE NEGATIVE Final    Comment:        The GeneXpert MRSA Assay (FDA approved for NASAL specimens only), is one component of a comprehensive MRSA colonization surveillance program. It is not intended to diagnose MRSA infection nor to guide or monitor treatment for MRSA infections. Performed at [facility], [place] [address] [place], [place] [address]      Radiology Studies: No results found.  Marzetta Board, MD, PhD [facility]  Between 7 am - 7 pm I am available, please contact me via Amion or  Securechat  Between 7 pm - 7 am I am not available, please contact night coverage MD/APP via Amion

## 2019-08-06 NOTE — Plan of Care (Signed)

## 2019-08-06 NOTE — TOC Initial Note (Addendum)
Transition of Care [facility]) - Initial/Assessment Note    Patient Details  Name: Richard Mitchell MRN: [id number] Date of Birth: 01-Mar-1941  Transition of [facility]) CM/SW Contact:    Alberteen Sam, [place] Phone Number: [phone] 08/06/2019, 10:17 AM  Clinical Narrative:                  CSW spoke with patent's spouse [name] to inform of SNF recommendation at time of discharge ,she reports she is going to discuss this with her family and patient today for their decision. CSW informed [name] that patient is currently requiring 2 person assistance, and that if declined SNF would need additional support in the home. She reports she is the only person home with patient, and she is in poor health. [name] reports after decision is made she will inform CSW if they would like to proceed with SNF bed search.   Referral made to Bradd Canary with [facility] for palliative services.   Expected Discharge Plan: Skilled Nursing Facility Barriers to Discharge: Continued Medical Work up   Patient Goals and CMS Choice   CMS Medicare.gov Compare Post Acute Care list provided to:: Patient Represenative (must comment)([name]) Choice offered to / list presented to : Spouse  Expected Discharge Plan and Services Expected Discharge Plan: [place] Acute Care Choice: [place] arrangements for the past 2 months: Single Family Home                                      Prior Living Arrangements/Services Living arrangements for the past 2 months: Single Family Home Lives with:: Spouse Patient language and need for interpreter reviewed:: Yes Do you feel safe going back to the place where you live?: Yes      Need for Family Participation in Patient Care: Yes (Comment) Care giver support system in place?: Yes (comment)   Criminal Activity/Legal Involvement Pertinent to Current Situation/Hospitalization: No - Comment as needed  Activities of Daily  Living Home Assistive Devices/Equipment: Walker (specify type) ADL Screening (condition at time of admission) Patient's cognitive ability adequate to safely complete daily activities?: Yes Is the patient deaf or have difficulty hearing?: No Does the patient have difficulty seeing, even when wearing glasses/contacts?: No Does the patient have difficulty concentrating, remembering, or making decisions?: Yes Patient able to express need for assistance with ADLs?: Yes Does the patient have difficulty dressing or bathing?: No Independently performs ADLs?: Yes (appropriate for developmental age) Does the patient have difficulty walking or climbing stairs?: Yes Weakness of Legs: Both Weakness of Arms/Hands: None  Permission Sought/Granted Permission sought to share information with : Case Manager, Customer service manager, Family Supports Permission granted to share information with : Yes, Verbal Permission Granted  Share Information with NAME: [name]  Permission granted to share info w AGENCY: SNFs  Permission granted to share info w Relationship: spouse  Permission granted to share info w Contact Information: [phone]  Emotional Assessment Appearance:: Appears stated age Attitude/Demeanor/Rapport: Unable to Assess Affect (typically observed): Unable to Assess Orientation: : Oriented to Self, Oriented to Place, Oriented to  Time, Oriented to Situation Alcohol / Substance Use: Not Applicable Psych Involvement: No (comment)  Admission diagnosis:  Aspiration pneumonia ([place]) [J69.0] Aspiration pneumonia of right lower lobe, unspecified aspiration pneumonia type [facility]) [J69.0] Patient Active Problem List   Diagnosis Date Noted  . Dysphagia 08/05/2019  . Generalized  weakness 08/05/2019  . Fall at home, initial encounter 08/05/2019  . Bradycardia 08/05/2019  . Aspiration pneumonia ([place]) 08/04/2019  . Essential hypertension 01/21/2018  . Hyperlipidemia 01/21/2018  . Aftercare  following surgery of the circulatory system, [place] 03/03/2014  . Occlusion and stenosis of carotid artery with cerebral infarction 03/03/2014  . PAD (peripheral artery disease) ([place])   . Onychomycosis   . Keratosis   . Occlusion and stenosis of carotid artery without mention of cerebral infarction 02/24/2013  . Gait disturbance 11/12/2012   PCP:  Marton Redwood, MD Pharmacy:   [facility] [place], [place] [address] Phone: [phone] Fax: [phone]     Social Determinants of Health (SDOH) Interventions    Readmission Risk Interventions No flowsheet data found.

## 2019-08-06 NOTE — Progress Notes (Addendum)
  Speech Language Pathology Treatment: Dysphagia(education with wife)  Patient Details Name: Richard Mitchell MRN: [id number] DOB: January 11, 1941 Today's Date: 08/06/2019 Time: 1420-1440 SLP Time Calculation (min) (ACUTE ONLY): 20 min  Assessment / Plan / Recommendation Clinical Impression  SLP in to follow up for education with pt's wife regarding continuing po intake with known high aspiration risk. SLP provided written information regarding risks of aspiration, strategies to minimize risk, and information regarding weighing risk and benefit of po intake on an ongoing basis. Pt will continue to be at significantly high risk for aspiration of even his own secretions, and is at risk for recurrent PNA. His wife verbalizes awareness of this. Education completed with wife. No further skilled ST intervention recommended at this time.     HPI HPI: 79yo male admitted 08/04/19 with generalized weakness and increasing falls. PMH: anemia, chronic dysphagia, CVA, gait disturbance, SDH s/p crani (1998), HTN, PAD, seizures      SLP Plan  All goals met - DC ST       Recommendations  Diet recommendations: Dysphagia 2 (fine chop);Thin liquid Liquids provided via: Straw;Cup Medication Administration: Crushed with puree Supervision: Patient able to self feed;Staff to assist with self feeding;Full supervision/cueing for compensatory strategies Compensations: Minimize environmental distractions;Slow rate;Small sips/bites;Clear throat intermittently Postural Changes and/or Swallow Maneuvers: Seated upright 90 degrees;Upright 30-60 min after meal                Oral Care Recommendations: Oral care QID;Oral care before and after PO Follow up Recommendations: 24 hour supervision/assistance SLP Visit Diagnosis: Dysphagia, unspecified (R13.10) Plan: All goals met       GO              Laren Whaling B. Quentin Ore, [facility], [place] Speech Language Pathologist Office: [phone] Pager: [phone]   Shonna Chock 08/06/2019, 2:46 PM

## 2019-08-06 NOTE — Consult Note (Signed)
Consultation Note Date: 08/06/2019   Patient Name: Richard Mitchell  DOB: April 08, 1941  MRN: [id number]  Age / Sex: 79 y.o., male  PCP: Marton Redwood, MD Referring Physician: Caren Griffins, MD  Reason for Consultation: Establishing goals of care and Psychosocial/spiritual support  HPI/Patient Profile: 79 y.o. 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    Wife reported to ED provider that patient has been weak for the past 2 to 3 days and has been falling very often.  He fell just prior to arrival and hit his head and was unable to get up.  Wife also reported that patient has a history of dysphagia from a prior stroke and has been coughing more often.  Patient states he has fallen twice in the past 2 days.  He normally uses a walker but fell while transferring from his chair to the walker.   ED Course: Oxygen saturation as low as 89% on room air reported by nursing staff.  Afebrile.  Bradycardic with heart rate in the 40s.  Labs showing WBC count 13.0.  Lactic acid level checked twice normal.  Hemoglobin 10.8 and MCV 99, no recent labs for comparison.  High-sensitivity troponin x2 negative.  UA with negative nitrite, trace amount of leukocytes, 0-5 WBCs, and rare bacteria.  Blood culture x2 pending.  SARS-CoV-2 PCR test negative.  Influenza panel negative.  Chest x-ray personally reviewed showing patchy airspace opacity at the right lung base concerning for aspiration pneumonia.    Patient and family report continued physical and functional decline over the last many months.  He has ongoing difficulty swallowing and  continued weight loss.  Wife reports that the care needs are getting harder and harder at home.  Patient and family face treatment option decisions, advanced directive decisions and anticipatory care needs.    Clinical Assessment and Goals of Care:   This NP Wadie Lessen reviewed medical records, received report from team, assessed the patient and then meet at the patient's bedside along with his wife to discuss diagnosis, prognosis, GOC, EOL wishes disposition and options.  Concept of Hospice and Palliative Care were discussed  A detailed discussion was had today regarding advanced directives.  Concepts specific to code status, artifical feeding and hydration, continued IV antibiotics and rehospitalization was had.  The difference between a aggressive medical intervention path  and a palliative comfort care path for this patient at this time was had.  Values and goals of care important to patient and family were attempted to be elicited.  MOST form completed and copy in chart/  Hard Choices booklet left for review    -DNR/DNI     -No artificial feeding now or in the future  Created space and opportunity for patient and his wife to  explore their thoughts and feelings regarding his current medical situation.  Both patient and his wife acknowledge that continued failure to thrive and the reality that his body is "getting tired" He hopes for continued life but is comfortable with the thoughts of his own mortality.  He tells me that his faith is a source of strength for him, and he knows that [facility]    Questions and concerns addressed.   Family encouraged to call with questions or concerns.    PMT will continue to support holistically.   No documented healthcare power of attorney at this time.  Patient's wife is his main support and Media planner.      SUMMARY OF RECOMMENDATIONS    Code Status/Advance Care Planning:  DNR-deviously documented  No artificial feeding  now or in the future  Patient and his wife hope  SNF for for short-term rehab on discharge before returning home.  Patient is open to hospice services once he returns home if he is eligible at that time    Symptom Management:   Dysphagia: Celia/speech therapist educated wife and patient on recommended diet and aspiration precautions within the context of comfort feeds, and known risk of aspiration   Palliative Prophylaxis:   Aspiration, Bowel Regimen, Delirium Protocol, Frequent Pain Assessment and Oral Care  Additional Recommendations (Limitations, Scope, Preferences):  Avoid Hospitalization, Initiate Comfort Feeding and No Artificial Feeding  Psycho-social/Spiritual:   Desire for further Chaplaincy support:yes  Additional Recommendations: Education on Hospice  Prognosis:   Unable to determine  Discharge Planning: [place] for rehab with Palliative care service follow-up      Primary Diagnoses: Present on Admission: . Aspiration pneumonia ([place]) . Essential hypertension   I have reviewed the medical record, interviewed the patient and family, and examined the patient. The following aspects are pertinent.  Past Medical History:  Diagnosis Date  . Anemia   . Cancer ([place])    [place]  . Carotid artery occlusion   . Dysphasia S/P CVA (cerebrovascular accident)   . Gait disturbance 11/12/2012  . History of subdural hematoma (post traumatic) Approx 1998  . Hypertension   . Keratosis 11/12/2012   x2  . Onychomycosis 11/12/2012   extreme neglect x 10  . Osteoporosis   . PAD (peripheral artery disease) ([place]) 11/12/2012   possible due to diminished pedal pulses  . Seizure disorder [facility])    Social History   Socioeconomic History  . Marital status: Married    Spouse name: Not on file  . Number of children: Not on file  . Years of education: Not on file  . Highest education level: Not on file  Occupational History  . Not on file  Tobacco Use  .  Smoking status: Former Smoker    Quit date: 11/19/1987    Years since quitting: 31.7  . Smokeless tobacco: Never Used  Substance and Sexual Activity  . Alcohol use: No    Alcohol/week: 0.0 standard drinks  . Drug use: No  . Sexual activity: Not on file  Other Topics Concern  . Not on file  Social History Narrative  . Not on file   Social Determinants of Health   Financial Resource Strain:   . Difficulty of Paying Living Expenses: Not on file  Food Insecurity:   . Worried About Charity fundraiser in the Last Year: Not on file  . Ran Out of Food in the Last Year: Not on file  Transportation Needs:   .  Lack of Transportation (Medical): Not on file  . Lack of Transportation (Non-Medical): Not on file  Physical Activity:   . Days of Exercise per Week: Not on file  . Minutes of Exercise per Session: Not on file  Stress:   . Feeling of Stress : Not on file  Social Connections:   . Frequency of Communication with Friends and Family: Not on file  . Frequency of Social Gatherings with Friends and Family: Not on file  . Attends Religious Services: Not on file  . Active Member of Clubs or Organizations: Not on file  . Attends Archivist Meetings: Not on file  . Marital Status: Not on file   Family History  Problem Relation Age of Onset  . Heart disease Mother        had pacemaker  . Cancer Mother        Breast  . Heart disease Sister   . Cancer Sister        Pancreatic  . Deep vein thrombosis Sister   . Heart attack Sister   . COPD Father    Scheduled Meds: . carbamazepine  400 mg Oral BID  . enoxaparin (LOVENOX) injection  40 mg Subcutaneous Q24H  . hydrochlorothiazide  12.5 mg Oral Daily  . irbesartan  150 mg Oral Daily  . rosuvastatin  20 mg Oral q1800   Continuous Infusions: . ampicillin-sulbactam (UNASYN) IV 3 g (08/06/19 0353)   PRN Meds:.acetaminophen **OR** acetaminophen, hydrALAZINE Medications Prior to Admission:  Prior to Admission medications     Medication Sig Start Date End Date Taking? Authorizing Provider  aspirin 81 MG tablet Take 81 mg by mouth daily.   Yes [provider]  carbamazepine (TEGRETOL) 200 MG tablet Take 400 mg by mouth 2 (two) times daily.    Yes [provider]  Carboxymethylcellul-Glycerin (CLEAR EYES FOR DRY EYES OP) Place 1 drop into both eyes 2 (two) times daily.   Yes [provider]  olmesartan-hydrochlorothiazide (BENICAR HCT) 20-12.5 MG tablet Take 1 tablet by mouth every morning. 11/16/17  Yes [provider]  rosuvastatin (CRESTOR) 20 MG tablet Take 20 mg by mouth daily.  10/20/16  Yes [provider]  Vitamin D, Ergocalciferol, (DRISDOL) 50000 UNITS CAPS Take 50,000 Units by mouth every 7 (seven) days.   Yes [provider]   No Known Allergies Review of Systems  Constitutional: Positive for unexpected weight change.  Gastrointestinal:       Difficulty swallowing  Neurological: Positive for weakness.    Physical Exam Constitutional:      Appearance: He is cachectic.  HENT:     Head:     Comments: -Intermittent audible throat secretions Cardiovascular:     Rate and Rhythm: Tachycardia present.  Musculoskeletal:     Comments: Generalized weakness and muscle atrophy  Skin:    General: Skin is dry.  Neurological:     Mental Status: He is alert and oriented to person, place, and time.     Vital Signs: BP 135/83 (BP Location: Right Arm)   Pulse (!) 102   Temp (!) 97.5 F (36.4 C) (Axillary)   Resp 18   Ht 6\' 2"  (1.88 m)   Wt 76.2 kg   SpO2 92%   BMI 21.57 kg/m  Pain Scale: 0-10 POSS *See Group Information*: 1-Acceptable,Awake and alert Pain Score: 7    SpO2: SpO2: 92 % O2 Device:SpO2: 92 % O2 Flow Rate: .O2 Flow Rate (L/min): 1 L/min  IO: Intake/output summary:  Intake/Output Summary (Last 24 hours) at 08/06/2019 0931 Last data filed at 08/05/2019 2339 Gross per 24 hour  Intake 363.79 ml  Output 1900 ml  Net -1536.21 ml     LBM: Last BM Date: 08/04/19 Baseline Weight: Weight: 72.1 kg Most recent weight: Weight: 76.2 kg     Palliative Assessment/Data: 30 % at best   Discussed with Dr Renne Crigler  Time In: 0800 Time Out: 0930 Time Total: 90 minutes Greater than 50%  of this time was spent counseling and coordinating care related to the above assessment and plan.  Signed by: Wadie Lessen, NP   Please contact Palliative Medicine Team phone at [phone] for questions and concerns.  For individual provider: See Shea Evans

## 2019-08-06 NOTE — Progress Notes (Signed)
Geneticist, molecular received referral from [facility] [place] for pt to participate in [facility] palliative program once pt discharges. Spoke with pt's wife to confirm interest. Explained palliative services and answered all current questions she has. Wife stated that she does not currently know if pt will dc home or to a SNF for rehab. [place] liaison to follow through hospital stay to determine discharge plan so that our palliative dept can arrange a visit.   Please do not hesitate to call with any questions and thank you for the referral.   Freddie Breech, RN [facility] Liaison  [phone]

## 2019-08-07 LAB — COMPREHENSIVE METABOLIC PANEL
ALT: 14 U/L (ref 0–44)
AST: 19 U/L (ref 15–41)
Albumin: 3.3 g/dL — ABNORMAL LOW (ref 3.5–5.0)
Alkaline Phosphatase: 71 U/L (ref 38–126)
Anion gap: 11 (ref 5–15)
BUN: 17 mg/dL (ref 8–23)
CO2: 22 mmol/L (ref 22–32)
Calcium: 8.6 mg/dL — ABNORMAL LOW (ref 8.9–10.3)
Chloride: 106 mmol/L (ref 98–111)
Creatinine, Ser: 0.96 mg/dL (ref 0.61–1.24)
GFR calc Af Amer: >60 mL/min (ref >60)
GFR calc non Af Amer: >60 mL/min (ref >60)
Glucose, Bld: 91 mg/dL (ref 70–99)
Potassium: 3.9 mmol/L (ref 3.5–5.1)
Sodium: 139 mmol/L (ref 135–145)
Total Bilirubin: 0.8 mg/dL (ref 0.3–1.2)
Total Protein: 6.4 g/dL — ABNORMAL LOW (ref 6.5–8.1)

## 2019-08-07 LAB — CBC
HCT: 33.9 % — ABNORMAL LOW (ref 39.0–52.0)
Hemoglobin: 11.2 g/dL — ABNORMAL LOW (ref 13.0–17.0)
MCH: 31.4 pg (ref 26.0–34.0)
MCHC: 33 g/dL (ref 30.0–36.0)
MCV: 95 fL (ref 80.0–100.0)
Platelets: 194 10*3/uL (ref 150–400)
RBC: 3.57 MIL/uL — ABNORMAL LOW (ref 4.22–5.81)
RDW: 13.4 % (ref 11.5–15.5)
WBC: 14.2 10*3/uL — ABNORMAL HIGH (ref 4.0–10.5)
nRBC: 0 % (ref 0.0–0.2)

## 2019-08-07 LAB — SARS CORONAVIRUS 2 (TAT 6-24 HRS): SARS Coronavirus 2: NEGATIVE

## 2019-08-07 MED ORDER — AMOXICILLIN-POT CLAVULANATE 875-125 MG PO TABS: 1.0000 | ORAL_TABLET | Freq: Two times a day (BID) | ORAL | 0 refills | Status: DC

## 2019-08-07 NOTE — Progress Notes (Signed)
Pharmacy Antibiotic Note  Richard Mitchell is a 79 y.o. male admitted on 08/04/2019 with aspiration pneumonia.  Pharmacy has been consulted for Unasyn dosing. Cultures negative, Cr stable, planning 7-day course.  Plan: Unasyn 3g IV Q6H  Height: 6\' 2"  (188 cm) Weight: 168 lb (76.2 kg) IBW/kg (Calculated) : 82.2  Temp (24hrs), Avg:97.8 F (36.6 C), Min:97.5 F (36.4 C), Max:98.1 F (36.7 C)  Recent Labs  Lab 08/04/19 1905 08/04/19 2057 08/04/19 2156 08/05/19 0211 08/06/19 0215 08/07/19 0205  WBC 13.0*  --   --  11.0* 8.9 14.2*  CREATININE 1.17  --   --   --  0.81 0.96  LATICACIDVEN  --  0.7 0.9  --   --   --     Estimated Creatinine Clearance: 65 mL/min (by C-G formula based on SCr of 0.96 mg/dL).    No Known Allergies   Thank you for allowing pharmacy to be a part of this patient's care.  Arrie Senate, PharmD, BCPS Clinical Pharmacist [phone] Please check AMION for all [place] numbers 08/07/2019

## 2019-08-07 NOTE — TOC Progression Note (Addendum)
Transition of Care [facility]) - Progression Note    Patient Details  Name: Richard Mitchell MRN: [id number] Date of Birth: 1941-05-12  Transition of Care [facility]) CM/SW [place], [place] Phone Number: [phone] 08/07/2019, 1:02 PM  Clinical Narrative:     Update: [facility] not in network with insurance, [facility] able to offer bed and has availability. Wife agreeable to [facility]  Insurance auth still pending and under review with HTA, CSW informed MD plan for dc tomorrow to [facility] and will need updated COVID Test.    CSW has started insurance auth with health team advantage, auth attempted to be started x2 this morning however no answer and no call back with voicemail left. CSW attempted a third time at noon and HTA answered and was able to start auth.   Patient has bed at [facility], however HTA reports not in network. [facility] reports they are in network and are following up. [facility] is only bed offer at this time and is 15 minute away from spouse's home. She reports wanting closer facility, CSW informed her no other bed offers at this time.   CSW continues to look for bed offers, awaiting follow up from [facility] to confirm they are in network with insurance. Pending insurance auth at this time.   Expected Discharge Plan: [place] Barriers to Discharge: Continued Medical Work up  Expected Discharge Plan and Services Expected Discharge Plan: [place] Choice: [place] arrangements for the past 2 months: Single Family Home                                       Social Determinants of Health (SDOH) Interventions    Readmission Risk Interventions No flowsheet data found.

## 2019-08-07 NOTE — Plan of Care (Signed)

## 2019-08-07 NOTE — Progress Notes (Signed)
Occupational Therapy Treatment Patient Details Name: Richard Mitchell MRN: [id number] DOB: [birth year] Today's Date: 08/07/2019    History of present illness Pt is an 79 year old man admitted 08/04/19 with generalized weakness and falls. PMH: anemia, chronic dysphagia, CVA, SDH s/p evacuation and craniotomy, PAD, seizures.   OT comments  Pt able to perform bed mobility with supervision, line assist only. Stood from elevated surface and transferred with min assist. Pt donned his shoes and socks at EOB with min assist.. He has orthotics in his shoes and mobilizes much better with them. Pt is eager to go to rehab and get stronger.   Follow Up Recommendations  SNF;Supervision/Assistance - 24 hour    Equipment Recommendations  3 in 1 bedside commode;Tub/shower bench    Recommendations for Other Services      Precautions / Restrictions Precautions Precautions: Fall Restrictions Weight Bearing Restrictions: No       Mobility Bed Mobility Overal bed mobility: Needs Assistance Bed Mobility: Supine to Sit     Supine to sit: Supervision     General bed mobility comments: HOB up, no physical assist, used rail  Transfers Overall transfer level: Needs assistance Equipment used: Rolling walker (2 wheeled) Transfers: Sit to/from [facility] Sit to Stand: Min assist;From elevated surface Stand pivot transfers: Min assist       General transfer comment: assist to rise and steady, pt initially with posterior lean, does much better with shoes on    Balance Overall balance assessment: Needs assistance   Sitting balance-Leahy Scale: Good Sitting balance - Comments: no LOB donning shoes and socks   Standing balance support: Bilateral upper extremity supported Standing balance-Leahy Scale: Poor Standing balance comment: dependent on RW                           ADL either performed or assessed with clinical judgement   ADL Overall ADL's : Needs  assistance/impaired                 Upper Body Dressing : Minimal assistance;Sitting   Lower Body Dressing: Minimal assistance;Sitting/lateral leans Lower Body Dressing Details (indicate cue type and reason): shoes and socks Toilet Transfer: Minimal assistance;Stand-pivot;BSC;RW   Toileting- Clothing Manipulation and Hygiene: Sit to/from stand;Moderate assistance               Vision       Perception     Praxis      Cognition Arousal/Alertness: Awake/alert Behavior During Therapy: WFL for tasks assessed/performed Overall Cognitive Status: No family/caregiver present to determine baseline cognitive functioning Area of Impairment: Following commands;Problem solving                       Following Commands: Follows one step commands with increased time     Problem Solving: Slow processing          Exercises     Shoulder Instructions       General Comments      Pertinent Vitals/ Pain       Pain Assessment: No/denies pain  Home Living                                          Prior Functioning/Environment              Frequency  Min 2X/week  Progress Toward Goals  OT Goals(current goals can now be found in the care plan section)  Progress towards OT goals: Progressing toward goals  Acute Rehab OT Goals Patient Stated Goal: to get stronger and return home OT Goal Formulation: With patient Time For Goal Achievement: 08/19/19 Potential to Achieve Goals: Good  Plan Discharge plan needs to be updated    Co-evaluation                 AM-PAC OT "6 Clicks" Daily Activity     Outcome Measure   Help from another person eating meals?: None Help from another person taking care of personal grooming?: A Little Help from another person toileting, which includes using toliet, bedpan, or urinal?: A Lot Help from another person bathing (including washing, rinsing, drying)?: A Lot Help from another person to  put on and taking off regular upper body clothing?: A Little Help from another person to put on and taking off regular lower body clothing?: A Little 6 Click Score: 17    End of Session Equipment Utilized During Treatment: Gait belt;Rolling walker;Oxygen  OT Visit Diagnosis: Unsteadiness on feet (R26.81);Other abnormalities of gait and mobility (R26.89);Muscle weakness (generalized) (M62.81);Other symptoms and signs involving cognitive function;History of falling (Z91.81)   Activity Tolerance Patient tolerated treatment well   Patient Left in chair;with call bell/phone within reach;with chair alarm set   Nurse Communication          Time: 1000-1027 OT Time Calculation (min): 27 min  Charges: OT General Charges $OT Visit: 1 Visit OT Treatments $Self Care/Home Management : 23-37 mins  Nestor Lewandowsky, OTR/L Acute Rehabilitation Services Pager: [phone] Office: [phone]   Malka So 08/07/2019, 12:55 PM

## 2019-08-07 NOTE — Progress Notes (Signed)
I responded to spiritual care consult for pastoral support. Richard Mitchell was sitting up in his chair. He was eating his lunch and just got of the phone. I was present with him and offered [place] space to voice concerns. Richard Mitchell shared some life stories of his time in the [employer], and his time as a Chief Executive Officer. He said he feels much better today than recently. Also, he said he was looking forward to going to a SNF. 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 He said his [name] is actively praying for him and his family. He said his care here was very good and thanked me for the time spent with him. I offered him words of encouragement and ministry of presence.   Palliative care  Resident [place] [phone]

## 2019-08-07 NOTE — Progress Notes (Signed)
Physical Therapy Treatment Patient Details Name: Richard Mitchell MRN: [id number] DOB: Feb 06, 1941 Today's Date: 08/07/2019    History of Present Illness Pt is an 79 year old man admitted 08/04/19 with generalized weakness and falls. PMH: anemia, chronic dysphagia, CVA, SDH s/p evacuation and craniotomy, PAD, seizures.    PT Comments    Pt tolerated treatment well with improved transfer quality and tolerance for OOB activity. Pt follows PT cues well for improved transfer technique, although continues to demonstrate reduced control in stand to sit descent. Pt will continue to benefit from acute PT POC to reduce falls risk, improve transfer quality, and reduce caregiver burden. PT continues to recommend SNF at discharge due to limited caregiver assistance and high falls risk.   Follow Up Recommendations  SNF     Equipment Recommendations  None recommended by PT    Recommendations for Other Services       Precautions / Restrictions Precautions Precautions: Fall Restrictions Weight Bearing Restrictions: No    Mobility  Bed Mobility Overal bed mobility: Needs Assistance Bed Mobility: Supine to Sit     Supine to sit: Supervision;HOB elevated     General bed mobility comments: HOB up, no physical assist, used rail  Transfers Overall transfer level: Needs assistance Equipment used: Rolling walker (2 wheeled) Transfers: Sit to/from Stand Sit to Stand: From elevated surface;Mod assist(modA progressing to minA with technique cues) Stand pivot transfers: Min assist       General transfer comment: pt performs 4 sit to stands initially requiring modA, progressing to minA with PT cues for scooting forward and forward trunk lean  Ambulation/Gait Ambulation/Gait assistance: Min guard Gait Distance (Feet): 200 Feet Assistive device: Rolling walker (2 wheeled) Gait Pattern/deviations: Step-through pattern;Trunk flexed Gait velocity: decreased Gait velocity interpretation: <1.8  ft/sec, indicate of risk for recurrent falls General Gait Details: pt with shortened stride length and increased trunk flexion. Pt able to temporarily reduce trunk flexion but reverts back with fatigue   Stairs             Wheelchair Mobility    Modified Rankin (Stroke Patients Only)       Balance Overall balance assessment: Needs assistance Sitting-balance support: No upper extremity supported;Feet supported Sitting balance-Leahy Scale: Good Sitting balance - Comments: supervision, donning shoes at edge of bed   Standing balance support: Bilateral upper extremity supported Standing balance-Leahy Scale: Fair Standing balance comment: minG-close supervision for static standing balance with BUE support of RW                            Cognition Arousal/Alertness: Awake/alert Behavior During Therapy: WFL for tasks assessed/performed Overall Cognitive Status: Within Functional Limits for tasks assessed Area of Impairment: Following commands;Problem solving                       Following Commands: Follows one step commands with increased time     Problem Solving: Slow processing        Exercises      General Comments General comments (skin integrity, edema, etc.): pt saturating from 87-93% on RA at rest and during ambulation, no noted SOB from pt, pt with wet voice quality      Pertinent Vitals/Pain Pain Assessment: No/denies pain    Home Living                      Prior Function  PT Goals (current goals can now be found in the care plan section) Acute Rehab PT Goals Patient Stated Goal: to get stronger and return home Progress towards PT goals: Progressing toward goals    Frequency    Min 3X/week      PT Plan Current plan remains appropriate    Co-evaluation              AM-PAC PT "6 Clicks" Mobility   Outcome Measure  Help needed turning from your back to your side while in a flat bed without  using bedrails?: A Little Help needed moving from lying on your back to sitting on the side of a flat bed without using bedrails?: A Little Help needed moving to and from a bed to a chair (including a wheelchair)?: A Little Help needed standing up from a chair using your arms (e.g., wheelchair or bedside chair)?: A Little Help needed to walk in hospital room?: A Little Help needed climbing 3-5 steps with a railing? : Total 6 Click Score: 16    End of Session   Activity Tolerance: Patient tolerated treatment well Patient left: in bed;with call bell/phone within reach Nurse Communication: Mobility status PT Visit Diagnosis: Unsteadiness on feet (R26.81);Other abnormalities of gait and mobility (R26.89);Muscle weakness (generalized) (M62.81)     Time: [id number] PT Time Calculation (min) (ACUTE ONLY): 38 min  Charges:  $Gait Training: 23-37 mins $Therapeutic Activity: 8-22 mins                     Zenaida Niece, PT, DPT Acute Rehabilitation Pager: [phone]    Zenaida Niece 08/07/2019, 3:52 PM

## 2019-08-07 NOTE — Progress Notes (Signed)
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  Over the last several days prior to admission wife reported that he has been falling very often.  He has a history of dysphagia from prior stroke but seems to have gotten a little bit worse with more coughing recently.  Chest x-ray on admission showed right lung base opacity concerning for aspiration.  Subjective / 24h Interval events: Doing well, no dyspnea, no chest pain, abdominal pain, nausea or vomiting  Assessment & Plan: Principal Problem Aspiration pneumonia with acute hypoxic respiratory failure -Initially noted to be mildly hypoxemic, he was able to be weaned off of oxygen and currently on room air -He has been placed on Unasyn, continue, narrow to augmentin on dc -Blood cultures without growth, urine culture with multiple species.  SARS-CoV-2 was negative.  Active Problems Chronic dysphagia, history of CVA -Speech pathology evaluated patient, recommended dysphagia to (fine chopped) with thin liquids -Palliative has also been consulted, no feeding tube, DNR in place  Generalized weakness, recent falls -Likely due to acute illness/pneumonia, head CT, C-spine, pelvis x-ray negative for fractures -PT recommending CIR, admission coordinator from [place] recommends SNF.  Social worker consulted. SNF when bed available  Sinus bradycardia, first-degree AV block, paroxysmal A. fib -Alternating between heart rate in the 40s to 90s-100.  Asymptomatic -Not a candidate for anticoagulation due to recurrent falls  Normocytic anemia,  likely of chronic illness -Hemoglobin stable, monitor, no bleeding  Essential hypertension -Continue home irbesartan/HCTZ  Hyperlipidemia -Continue statin  Seizure disorder -Stable, continue Tegretol  Scheduled Meds: . carbamazepine  400 mg Oral BID  . enoxaparin (LOVENOX) injection  40 mg Subcutaneous Q24H  . hydrochlorothiazide  12.5 mg Oral Daily  . irbesartan  150 mg Oral Daily  . rosuvastatin  20 mg Oral q1800   Continuous Infusions: . ampicillin-sulbactam (UNASYN) IV 3 g (08/07/19 1039)   PRN Meds:.acetaminophen **OR** acetaminophen, hydrALAZINE  DVT prophylaxis: Lovenox Code Status: DNR Family Communication: Discussed with patient Patient admitted from: Home Anticipated d/c place: Skilled nursing Barriers to d/c: SNF bed, heart rate control, palliative evaluation pending  Consultants:  Palliative care  Procedures:  none  Microbiology  none  Antimicrobials: Unasyn     Objective: Vitals:   08/07/19 0405 08/07/19 0800 08/07/19 0830 08/07/19 1120  BP: (!) 109/56 (!) 108/55  (!) 103/53  Pulse: 71 61 64 (!) 59  Resp: (!) 23 16 20 12   Temp: 98.1 F (36.7 C) 97.8 F (36.6 C)  (!) 97.2 F (36.2 C)  TempSrc:  Oral  Oral  SpO2: 93% 95%  92%  Weight:      Height:        Intake/Output Summary (Last 24 hours) at 08/07/2019 1330 Last data filed at 08/07/2019 0600 Gross per 24 hour  Intake 220 ml  Output 900 ml  Net -680 ml   Filed Weights   08/04/19 2300 08/05/19 0023  Weight: 72.1 kg 76.2 kg    Examination:  Constitutional: NAD Eyes: no icterus  ENMT: mmm Neck: normal, supple Respiratory: diminished at the bases, no wheezing Cardiovascular: RRR, 3/6 SEM, no edema Abdomen: soft, NT, ND, BS+  Musculoskeletal: no clubbing / cyanosis.  Skin: no rashes Neurologic: non focal   Data Reviewed: I have independently reviewed following labs and imaging studies   CBC: Recent Labs  Lab 08/04/19 1905 08/05/19 0211 08/06/19 0215 08/07/19 0205  WBC  13.0* 11.0* 8.9 14.2*  NEUTROABS 10.7*  --   --   --   HGB 10.8* 10.3* 11.2* 11.2*  HCT 34.3* 31.9* 34.4* 33.9*  MCV 99.4 96.4 95.6 95.0  PLT 199 186 208 Q000111Q   Basic Metabolic Panel: Recent Labs  Lab 08/04/19 1905 08/06/19 0215 08/07/19 0205  NA 135 138 139  K 4.9 4.4 3.9  CL 102 108 106  CO2 23 21* 22  GLUCOSE 118* 92 91  BUN 33* 14 17  CREATININE 1.17 0.81 0.96  CALCIUM 8.9 8.9 8.6*   Liver Function Tests: Recent Labs  Lab 08/04/19 1905 08/07/19 0205  AST 17 19  ALT 15 14  ALKPHOS 66 71  BILITOT 0.4 0.8  PROT 6.8 6.4*  ALBUMIN 3.8 3.3*   Coagulation Profile: No results for input(s): INR, PROTIME in the last 168 hours. HbA1C: No results for input(s): HGBA1C in the last 72 hours. CBG: No results for input(s): GLUCAP in the last 168 hours.  Recent Results (from the past 240 hour(s))  Urine Culture     Status: Abnormal   Collection Time: 08/04/19  7:10 PM   Specimen: Urine, Random  Result Value Ref Range Status   Specimen Description URINE, RANDOM  Final   Special Requests   Final    NONE Performed at [facility], [address] [place], [place] [address]    Culture MULTIPLE SPECIES PRESENT, SUGGEST RECOLLECTION (A)  Final   Report Status 08/06/2019 FINAL  Final  Respiratory Panel by RT PCR (Flu A&B, Covid) - Nasopharyngeal Swab     Status: None   Collection Time: 08/04/19  7:24 PM   Specimen: Nasopharyngeal Swab  Result Value Ref Range Status   SARS Coronavirus 2 by RT PCR NEGATIVE NEGATIVE Final    Comment: (NOTE) SARS-CoV-2 target nucleic acids are NOT DETECTED. The SARS-CoV-2 RNA is generally detectable in upper respiratoy specimens during the acute phase of infection. The lowest concentration of SARS-CoV-2 viral copies this assay can detect is 131 copies/mL. A negative result does not preclude SARS-Cov-2 infection and should not be used as the sole basis for treatment or other patient management decisions. A negative result may occur with    improper specimen collection/handling, submission of specimen other than nasopharyngeal swab, presence of viral mutation(s) within the areas targeted by this assay, and inadequate number of viral copies (<131 copies/mL). A negative result must be combined with clinical observations, patient history, and epidemiological information. The expected result is Negative. Fact Sheet for Patients:  PinkCheek.be Fact Sheet for Healthcare Providers:  GravelBags.it This test is not yet ap proved or cleared by the [place] FDA and  has been authorized for detection and/or diagnosis of SARS-CoV-2 by FDA under an Emergency Use Authorization (EUA). This EUA will remain  in effect (meaning this test can be used) for the duration of the COVID-19 declaration under Section 564(b)(1) of the Act, 21 U.S.C. section 360bbb-3(b)(1), unless the authorization is terminated or revoked sooner.    Influenza A by PCR NEGATIVE NEGATIVE Final   Influenza B by PCR NEGATIVE NEGATIVE Final    Comment: (NOTE) The Xpert Xpress SARS-CoV-2/FLU/RSV assay is intended as an aid in  the diagnosis of influenza from Nasopharyngeal swab specimens and  should not be used as a sole basis for treatment. Nasal washings and  aspirates are unacceptable for Xpert Xpress SARS-CoV-2/FLU/RSV  testing. Fact Sheet for Patients: PinkCheek.be Fact Sheet for Healthcare Providers: GravelBags.it This test is not yet approved or cleared by the [place] FDA and  has been authorized for detection and/or diagnosis of SARS-CoV-2 by  FDA under an Emergency Use Authorization (EUA). This EUA will remain  in effect (meaning this test can be used) for the duration of the  Covid-19 declaration under Section 564(b)(1) of the Act, 21  U.S.C. section 360bbb-3(b)(1), unless the authorization is  terminated or revoked. Performed at  [facility], [place] [address], [place], [place] [address]   Blood culture (routine x 2)     Status: None (Preliminary result)   Collection Time: 08/04/19  8:58 PM   Specimen: BLOOD  Result Value Ref Range Status   Specimen Description BLOOD RIGHT ANTECUBITAL  Final   Special Requests   Final    BOTTLES DRAWN AEROBIC AND ANAEROBIC Blood Culture adequate volume   Culture   Final    NO GROWTH 3 DAYS Performed at [facility], [place] [address], [place], [place] [address]    Report Status PENDING  Incomplete  Blood culture (routine x 2)     Status: None (Preliminary result)   Collection Time: 08/04/19  9:47 PM   Specimen: BLOOD RIGHT HAND  Result Value Ref Range Status   Specimen Description BLOOD RIGHT HAND  Final   Special Requests   Final    BOTTLES DRAWN AEROBIC AND ANAEROBIC Blood Culture adequate volume   Culture   Final    NO GROWTH 3 DAYS Performed at [facility], [place] [address] [place], [place] [address]    Report Status PENDING  Incomplete  MRSA PCR Screening     Status: None   Collection Time: 08/05/19 12:55 AM   Specimen: Nasal Mucosa; Nasopharyngeal  Result Value Ref Range Status   MRSA by PCR NEGATIVE NEGATIVE Final    Comment:        The GeneXpert MRSA Assay (FDA approved for NASAL specimens only), is one component of a comprehensive MRSA colonization surveillance program. It is not intended to diagnose MRSA infection nor to guide or monitor treatment for MRSA infections. Performed at [facility], [place] [address] [place], [place] [address]      Radiology Studies: No results found.  Marzetta Board, MD, PhD [facility]  Between 7 am - 7 pm I am available, please contact me via Amion or Securechat  Between 7 pm - 7 am I am not available, please contact night coverage MD/APP via Amion

## 2019-08-08 MED ORDER — DOCUSATE SODIUM 50 MG/5ML PO LIQD: 100.0000 mg | Freq: Every day | ORAL | Status: DC | PRN

## 2019-08-08 MED ORDER — METOPROLOL TARTRATE 5 MG/5ML IV SOLN: 2.5000 mg | INTRAVENOUS | Status: DC | PRN

## 2019-08-08 NOTE — Progress Notes (Signed)
Pharmacy Antibiotic Note  Richard Mitchell is a 79 y.o. male admitted on 08/04/2019  Now with aspiration PNA.  Pharmacy has been consulted for Unasyn dosing.  ID: Unasyn D#4 asp PNA. Afebrile. WBC increased 8.9>>14.2. Scr WNL 1/27: Unasyn>>  1/26: BCX 2>>IP 1/29: COVID negative:  1/26: UCx: mult species 1/26: MRSA PCR: neg  Plan: Unasyn 3g IV Q6H for asp pna - 7d course recommended Pharmacy will sign off. Please reconsult for further dosing assitance.     Height: 6\' 2"  (188 cm) Weight: 168 lb (76.2 kg) IBW/kg (Calculated) : 82.2  Temp (24hrs), Avg:97.3 F (36.3 C), Min:97.2 F (36.2 C), Max:97.4 F (36.3 C)  Recent Labs  Lab 08/04/19 1905 08/04/19 2057 08/04/19 2156 08/05/19 0211 08/06/19 0215 08/07/19 0205  WBC 13.0*  --   --  11.0* 8.9 14.2*  CREATININE 1.17  --   --   --  0.81 0.96  LATICACIDVEN  --  0.7 0.9  --   --   --     Estimated Creatinine Clearance: 65 mL/min (by C-G formula based on SCr of 0.96 mg/dL).    No Known Allergies  Richard Mitchell, PharmD, BCPS Clinical Staff Pharmacist [email or web address]  Richard Mitchell 08/08/2019 1:28 PM

## 2019-08-08 NOTE — Progress Notes (Signed)
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  Over the last several days prior to admission wife reported that he has been falling very often.  He has a history of dysphagia from prior stroke but seems to have gotten a little bit worse with more coughing recently.  Chest x-ray on admission showed right lung base opacity concerning for aspiration.  Subjective / 24h Interval events: Doing well, no dyspnea, no chest pain, abdominal pain, nausea or vomiting.  Awaiting discharge  Assessment & Plan: Principal Problem Aspiration pneumonia with acute hypoxic respiratory failure -Initially noted to be mildly hypoxemic, he was able to be weaned off of oxygen and currently on room air -He has been placed on Unasyn, continue, narrow to augmentin on dc -Blood cultures without growth, urine culture with multiple species.  SARS-CoV-2 was negative.  Active Problems Chronic dysphagia, history of CVA -Speech pathology evaluated patient, recommended dysphagia to (fine chopped) with thin liquids -Palliative has also been consulted, no feeding tube, DNR in place  Generalized weakness, recent falls -Likely due to acute illness/pneumonia, head CT, C-spine, pelvis x-ray negative for fractures -PT recommending CIR, admission coordinator from [place] recommends SNF.  Social worker consulted. SNF when bed available  Sinus bradycardia, first-degree AV block, paroxysmal A. fib -Alternating between heart rate in the 40s to 90s-100.  Asymptomatic -Not a candidate for anticoagulation due to recurrent  falls  Normocytic anemia, likely of chronic illness -Hemoglobin stable, monitor, no bleeding  Essential hypertension -Continue home irbesartan/HCTZ  Hyperlipidemia -Continue statin  Seizure disorder -Stable, continue Tegretol  Scheduled Meds: . carbamazepine  400 mg Oral BID  . enoxaparin (LOVENOX) injection  40 mg Subcutaneous Q24H  . hydrochlorothiazide  12.5 mg Oral Daily  . irbesartan  150 mg Oral Daily  . rosuvastatin  20 mg Oral q1800   Continuous Infusions: . ampicillin-sulbactam (UNASYN) IV 3 g (08/08/19 0945)   PRN Meds:.acetaminophen **OR** acetaminophen, hydrALAZINE  DVT prophylaxis: Lovenox Code Status: DNR Family Communication: Discussed with patient Patient admitted from: Home Anticipated d/c place: Skilled nursing Barriers to d/c: SNF bed, heart rate control, palliative evaluation pending  Consultants:  Palliative care  Procedures:  none  Microbiology  none  Antimicrobials: Unasyn     Objective: Vitals:   08/07/19 2330 08/08/19 0257 08/08/19 0741 08/08/19 1113  BP: 108/71 119/64 112/78 (!) 93/59  Pulse: 65 (!) 57 61   Resp: 17 16 15 16   Temp: (!) 97.4 F (36.3 C) (!) 97.3 F (36.3 C) (!) 97.3 F (36.3 C) (!) 97.2 F (36.2 C)  TempSrc: Oral Oral Oral Oral  SpO2: 92% 93% 90%   Weight:      Height:        Intake/Output Summary (Last 24 hours) at 08/08/2019 1239 Last data filed at 08/08/2019 0800 Gross per 24 hour  Intake 831.72 ml  Output 850 ml  Net -18.28 ml   Filed Weights   08/04/19 2300 08/05/19 0023  Weight: 72.1 kg 76.2 kg    Examination:  Constitutional: NAD Eyes: no icterus  ENMT: mmm Neck: normal, supple Respiratory: diminished at the bases, no wheezing Cardiovascular: RRR, 3/6 SEM, no  edema Abdomen: soft, NT, ND, BS+ Musculoskeletal: no clubbing / cyanosis.  Skin: no rashes Neurologic: non focal   Data Reviewed: I have independently reviewed following labs and imaging studies   CBC: Recent Labs  Lab  2019/08/06 1905 08/05/19 0211 08/06/19 0215 08/07/19 0205  WBC 13.0* 11.0* 8.9 14.2*  NEUTROABS 10.7*  --   --   --   HGB 10.8* 10.3* 11.2* 11.2*  HCT 34.3* 31.9* 34.4* 33.9*  MCV 99.4 96.4 95.6 95.0  PLT 199 186 208 Q000111Q   Basic Metabolic Panel: Recent Labs  Lab 08/06/2019 1905 08/06/19 0215 08/07/19 0205  NA 135 138 139  K 4.9 4.4 3.9  CL 102 108 106  CO2 23 21* 22  GLUCOSE 118* 92 91  BUN 33* 14 17  CREATININE 1.17 0.81 0.96  CALCIUM 8.9 8.9 8.6*   Liver Function Tests: Recent Labs  Lab 08/06/2019 1905 08/07/19 0205  AST 17 19  ALT 15 14  ALKPHOS 66 71  BILITOT 0.4 0.8  PROT 6.8 6.4*  ALBUMIN 3.8 3.3*   Coagulation Profile: No results for input(s): INR, PROTIME in the last 168 hours. HbA1C: No results for input(s): HGBA1C in the last 72 hours. CBG: No results for input(s): GLUCAP in the last 168 hours.  Recent Results (from the past 240 hour(s))  Urine Culture     Status: Abnormal   Collection Time: August 06, 2019  7:10 PM   Specimen: Urine, Random  Result Value Ref Range Status   Specimen Description URINE, RANDOM  Final   Special Requests   Final    NONE Performed at [facility], [address], [place], [place] [address]    Culture MULTIPLE SPECIES PRESENT, SUGGEST RECOLLECTION (A)  Final   Report Status 08/06/2019 FINAL  Final  Respiratory Panel by RT PCR (Flu A&B, Covid) - Nasopharyngeal Swab     Status: None   Collection Time: 2019-08-06  7:24 PM   Specimen: Nasopharyngeal Swab  Result Value Ref Range Status   SARS Coronavirus 2 by RT PCR NEGATIVE NEGATIVE Final    Comment: (NOTE) SARS-CoV-2 target nucleic acids are NOT DETECTED. The SARS-CoV-2 RNA is generally detectable in upper respiratoy specimens during the acute phase of infection. The lowest concentration of SARS-CoV-2 viral copies this assay can detect is 131 copies/mL. A negative result does not preclude SARS-Cov-2 infection and should not be used as the sole basis for treatment or other  patient management decisions. A negative result may occur with  improper specimen collection/handling, submission of specimen other than nasopharyngeal swab, presence of viral mutation(s) within the areas targeted by this assay, and inadequate number of viral copies (<131 copies/mL). A negative result must be combined with clinical observations, patient history, and epidemiological information. The expected result is Negative. Fact Sheet for Patients:  PinkCheek.be Fact Sheet for Healthcare Providers:  GravelBags.it This test is not yet ap proved or cleared by the [place] FDA and  has been authorized for detection and/or diagnosis of SARS-CoV-2 by FDA under an Emergency Use Authorization (EUA). This EUA will remain  in effect (meaning this test can be used) for the duration of the COVID-19 declaration under Section 564(b)(1) of the Act, 21 U.S.C. section 360bbb-3(b)(1), unless the authorization is terminated or revoked sooner.    Influenza A by PCR NEGATIVE NEGATIVE Final   Influenza B by PCR NEGATIVE NEGATIVE Final    Comment: (NOTE) The Xpert Xpress SARS-CoV-2/FLU/RSV assay is intended as an aid in  the diagnosis of influenza  from Nasopharyngeal swab specimens and  should not be used as a sole basis for treatment. Nasal washings and  aspirates are unacceptable for Xpert Xpress SARS-CoV-2/FLU/RSV  testing. Fact Sheet for Patients: PinkCheek.be Fact Sheet for Healthcare Providers: GravelBags.it This test is not yet approved or cleared by the [place] FDA and  has been authorized for detection and/or diagnosis of SARS-CoV-2 by  FDA under an Emergency Use Authorization (EUA). This EUA will remain  in effect (meaning this test can be used) for the duration of the  Covid-19 declaration under Section 564(b)(1) of the Act, 21  U.S.C. section 360bbb-3(b)(1), unless  the authorization is  terminated or revoked. Performed at [facility], [place] [address], [place], [place] [address]   Blood culture (routine x 2)     Status: None (Preliminary result)   Collection Time: 08/04/19  8:58 PM   Specimen: BLOOD  Result Value Ref Range Status   Specimen Description BLOOD RIGHT ANTECUBITAL  Final   Special Requests   Final    BOTTLES DRAWN AEROBIC AND ANAEROBIC Blood Culture adequate volume   Culture   Final    NO GROWTH 3 DAYS Performed at [facility], [place] [address] [place], [place] [address]    Report Status PENDING  Incomplete  Blood culture (routine x 2)     Status: None (Preliminary result)   Collection Time: 08/04/19  9:47 PM   Specimen: BLOOD RIGHT HAND  Result Value Ref Range Status   Specimen Description BLOOD RIGHT HAND  Final   Special Requests   Final    BOTTLES DRAWN AEROBIC AND ANAEROBIC Blood Culture adequate volume   Culture   Final    NO GROWTH 3 DAYS Performed at [facility], [place] [address], [place], [place] [address]    Report Status PENDING  Incomplete  MRSA PCR Screening     Status: None   Collection Time: 08/05/19 12:55 AM   Specimen: Nasal Mucosa; Nasopharyngeal  Result Value Ref Range Status   MRSA by PCR NEGATIVE NEGATIVE Final    Comment:        The GeneXpert MRSA Assay (FDA approved for NASAL specimens only), is one component of a comprehensive MRSA colonization surveillance program. It is not intended to diagnose MRSA infection nor to guide or monitor treatment for MRSA infections. Performed at [facility], [place] [address] [place], [place] [address]   SARS CORONAVIRUS 2 (TAT 6-24 HRS) Nasopharyngeal Nasopharyngeal Swab     Status: None   Collection Time: 08/07/19  6:35 PM   Specimen: Nasopharyngeal Swab  Result Value Ref Range Status   SARS Coronavirus 2 NEGATIVE NEGATIVE Final    Comment: (NOTE) SARS-CoV-2 target nucleic acids are NOT DETECTED. The SARS-CoV-2 RNA is generally  detectable in upper and lower respiratory specimens during the acute phase of infection. Negative results do not preclude SARS-CoV-2 infection, do not rule out co-infections with other pathogens, and should not be used as the sole basis for treatment or other patient management decisions. Negative results must be combined with clinical observations, patient history, and epidemiological information. The expected result is Negative. Fact Sheet for Patients: SugarRoll.be Fact Sheet for Healthcare Providers: https://www.woods-mathews.com/ This test is not yet approved or cleared by the [place] FDA and  has been authorized for detection and/or diagnosis of SARS-CoV-2 by FDA under an Emergency Use Authorization (EUA). This EUA will remain  in effect (meaning this test can be used) for the duration of  the COVID-19 declaration under Section 56 4(b)(1) of the Act, 21 U.S.C. section 360bbb-3(b)(1), unless the authorization is terminated or revoked sooner. Performed at [facility], [place] [address] [place], [place] [address]      Radiology Studies: No results found. Benito Mccreedy, MD [facility]  Between 7 am - 7 pm I am available, please contact me via Amion or Securechat  Between 7 pm - 7 am I am not available, please contact night coverage MD/APP via Amion

## 2019-08-08 NOTE — Progress Notes (Addendum)
Received call from telemetry at 2208 that patient has converted at 2107 from NSR to a. Fib. Patient's rate in the 90's to 100's at rest. Patient does not appear symptomatic at this time.  On-call B[place] notified of this change.  Will continue to utilize telemetry monitors for any changes in patient's rhythm.

## 2019-08-08 NOTE — Progress Notes (Signed)
Patient is supposed to discharge to SNF, but awaiting for insurance company to approve it. Paged case manager Debbie and awaiting to call back. [employer]

## 2019-08-08 NOTE — Progress Notes (Addendum)
CSW confirmed with [facility] SN[facility] pt is to go there on Monday 2/1.  CSW called [facility] as pt was being referred there also and left a HIPPA-compliant VM for Chantelle requesting an update as [place] states they are in [place] with [facility], but [facility] states [facility] is not.  CSW will leave handoff for 1st shift CM/CSW on 1/31.  CSW will continue to follow for D/C needs.  Alphonse Guild. Kabrina Christiano, LCSW, LCAS, CSI Transitions of Care Clinical Social Worker Care Coordination Department Ph: [phone]

## 2019-08-09 DIAGNOSIS — I484 Atypical atrial flutter: Secondary | ICD-10-CM

## 2019-08-09 DIAGNOSIS — I480 Paroxysmal atrial fibrillation: Secondary | ICD-10-CM

## 2019-08-09 DIAGNOSIS — J690 Pneumonitis due to inhalation of food and vomit: Principal | ICD-10-CM

## 2019-08-09 DIAGNOSIS — R001 Bradycardia, unspecified: Secondary | ICD-10-CM

## 2019-08-09 LAB — URINALYSIS, ROUTINE W REFLEX MICROSCOPIC
Bilirubin Urine: NEGATIVE
Glucose, UA: NEGATIVE mg/dL
Hgb urine dipstick: NEGATIVE
Ketones, ur: NEGATIVE mg/dL
Leukocytes,Ua: NEGATIVE
Nitrite: NEGATIVE
Protein, ur: NEGATIVE mg/dL
Specific Gravity, Urine: 1.021 (ref 1.005–1.030)
pH: 5 (ref 5.0–8.0)

## 2019-08-09 LAB — CULTURE, BLOOD (ROUTINE X 2)
Culture: NO GROWTH
Culture: NO GROWTH
Special Requests: ADEQUATE
Special Requests: ADEQUATE

## 2019-08-09 MED ORDER — LOPERAMIDE HCL 2 MG PO CAPS
4.0000 mg | ORAL_CAPSULE | Freq: Once | ORAL | Status: AC
  Administered 2019-08-09: 04:00:00 4 mg via ORAL
  Filled 2019-08-09: qty 2

## 2019-08-09 NOTE — Progress Notes (Signed)
PROGRESS NOTE  Richard Mitchell [id number] DOB: [birth year] DOA: 08/04/2019 PCP: Marton Redwood, MD   LOS: 5 days   Brief Narrative / Interim history: 79 year old male with history of chronic dysphagia, gait disturbance, anemia, history of subdural hematoma status post evacuation via craniotomy, hypertension, PAD, seizure disorder who came into the hospital and was admitted on 08/04/2019 due to generalized weakness and recurrent recent falls as well as a cough.  Over the last several days prior to admission wife reported that he has been falling very often.  He has a history of dysphagia from prior stroke but seems to have gotten a little bit worse with more coughing recently.  Chest x-ray on admission showed right lung base opacity concerning for aspiration.  Subjective / 24h Interval events: Blood pressure soft antihypertensive regimen adjusted.  Also pinkish urine noted per nursing and urinalysis ordered, otherwise no acute events but overnight.  Patient awaiting discharge summary completed  Assessment & Plan: Principal Problem Aspiration pneumonia with acute hypoxic respiratory failure -Initially noted to be mildly hypoxemic, he was able to be weaned off of oxygen and currently on room air -He has been placed on Unasyn, continue, narrow to augmentin on dc -Blood cultures without growth, urine culture with multiple species.  SARS-CoV-2 was negative.  Active Problems Chronic dysphagia, history of CVA -Speech pathology evaluated patient, recommended dysphagia to (fine chopped) with thin liquids -Palliative has also been consulted, no feeding tube, DNR in place  Generalized weakness, recent falls -Likely due to acute illness/pneumonia, head CT, C-spine, pelvis x-ray negative for fractures -PT recommending CIR, admission coordinator from [place] recommends SNF.  Social worker consulted. SNF when bed available  Sinus bradycardia, first-degree AV block, paroxysmal A. fib -Alternating between  heart rate in the 40s to 90s-100.  Asymptomatic -Not a candidate for anticoagulation due to recurrent falls  Normocytic anemia, likely of chronic illness -Hemoglobin stable, monitor, no bleeding  Essential hypertension -Soft/hold blood pressure pills for now  Hyperlipidemia -Continue statin  Seizure disorder -Stable, continue Tegretol  Scheduled Meds: . carbamazepine  400 mg Oral BID  . enoxaparin (LOVENOX) injection  40 mg Subcutaneous Q24H  . hydrochlorothiazide  12.5 mg Oral Daily  . irbesartan  150 mg Oral Daily  . rosuvastatin  20 mg Oral q1800   Continuous Infusions: . ampicillin-sulbactam (UNASYN) IV 3 g (08/09/19 0931)   PRN Meds:.acetaminophen **OR** acetaminophen, docusate, hydrALAZINE, metoprolol tartrate  DVT prophylaxis: Lovenox Code Status: DNR Family Communication: Discussed with patient Patient admitted from: Home Anticipated d/c place: Skilled nursing Barriers to d/c: SNF bed   Consultants:  Palliative care  Procedures:  none  Microbiology  none  Antimicrobials: Unasyn     Objective: Vitals:   08/09/19 0400 08/09/19 0415 08/09/19 0757 08/09/19 0935  BP:   91/61 (!) 87/67  Pulse: (!) 114 96 (!) 117   Resp: 20 18 (!) 21 (!) 23  Temp:   97.7 F (36.5 C)   TempSrc:   Oral   SpO2:   90%   Weight:      Height:        Intake/Output Summary (Last 24 hours) at 08/09/2019 1103 Last data filed at 08/09/2019 0800 Gross per 24 hour  Intake 1017.07 ml  Output --  Net 1017.07 ml   Filed Weights   08/04/19 2300 08/05/19 0023  Weight: 72.1 kg 76.2 kg    Examination:  Constitutional: NAD Eyes: no icterus  ENMT: mmm Neck: normal, supple Respiratory: diminished at the bases, no wheezing Cardiovascular: RRR,  3/6 SEM, no edema Abdomen: soft, NT, ND, BS+ Musculoskeletal: no clubbing / cyanosis.  Skin: no rashes Neurologic: non focal   Data Reviewed: I have independently reviewed following labs and imaging studies   CBC: Recent Labs   Lab 08/28/19 1905 08/05/19 0211 08/06/19 0215 08/07/19 0205  WBC 13.0* 11.0* 8.9 14.2*  NEUTROABS 10.7*  --   --   --   HGB 10.8* 10.3* 11.2* 11.2*  HCT 34.3* 31.9* 34.4* 33.9*  MCV 99.4 96.4 95.6 95.0  PLT 199 186 208 [id number]   Basic Metabolic Panel: Recent Labs  Lab 2019/08/28 1905 08/06/19 0215 08/07/19 0205  NA 135 138 139  K 4.9 4.4 3.9  CL 102 108 106  CO2 23 21* 22  GLUCOSE 118* 92 91  BUN 33* 14 17  CREATININE 1.17 0.81 0.96  CALCIUM 8.9 8.9 8.6*   Liver Function Tests: Recent Labs  Lab August 28, 2019 1905 08/07/19 0205  AST 17 19  ALT 15 14  ALKPHOS 66 71  BILITOT 0.4 0.8  PROT 6.8 6.4*  ALBUMIN 3.8 3.3*   Coagulation Profile: No results for input(s): INR, PROTIME in the last 168 hours. HbA1C: No results for input(s): HGBA1C in the last 72 hours. CBG: No results for input(s): GLUCAP in the last 168 hours.  Recent Results (from the past 240 hour(s))  Urine Culture     Status: Abnormal   Collection Time: 08/28/19  7:10 PM   Specimen: Urine, Random  Result Value Ref Range Status   Specimen Description URINE, RANDOM  Final   Special Requests   Final    NONE Performed at [facility], [address], [place],  [address]    Culture MULTIPLE SPECIES PRESENT, SUGGEST RECOLLECTION (A)  Final   Report Status 08/06/2019 FINAL  Final  Respiratory Panel by RT PCR (Flu A&B, Covid) - Nasopharyngeal Swab     Status: None   Collection Time: Aug 28, 2019  7:24 PM   Specimen: Nasopharyngeal Swab  Result Value Ref Range Status   SARS Coronavirus 2 by RT PCR NEGATIVE NEGATIVE Final    Comment: (NOTE) SARS-CoV-2 target nucleic acids are NOT DETECTED. The SARS-CoV-2 RNA is generally detectable in upper respiratoy specimens during the acute phase of infection. The lowest concentration of SARS-CoV-2 viral copies this assay can detect is 131 copies/mL. A negative result does not preclude SARS-Cov-2 infection and should not be used as the sole basis for treatment  or other patient management decisions. A negative result may occur with  improper specimen collection/handling, submission of specimen other than nasopharyngeal swab, presence of viral mutation(s) within the areas targeted by this assay, and inadequate number of viral copies (<131 copies/mL). A negative result must be combined with clinical observations, patient history, and epidemiological information. The expected result is Negative. Fact Sheet for Patients:  PinkCheek.be Fact Sheet for Healthcare Providers:  GravelBags.it This test is not yet ap proved or cleared by the [place] FDA and  has been authorized for detection and/or diagnosis of SARS-CoV-2 by FDA under an Emergency Use Authorization (EUA). This EUA will remain  in effect (meaning this test can be used) for the duration of the COVID-19 declaration under Section 564(b)(1) of the Act, 21 U.S.C. section 360bbb-3(b)(1), unless the authorization is terminated or revoked sooner.    Influenza A by PCR NEGATIVE NEGATIVE Final   Influenza B by PCR NEGATIVE NEGATIVE Final    Comment: (NOTE) The Xpert Xpress SARS-CoV-2/FLU/RSV assay is intended as an aid in  the  diagnosis of influenza from Nasopharyngeal swab specimens and  should not be used as a sole basis for treatment. Nasal washings and  aspirates are unacceptable for Xpert Xpress SARS-CoV-2/FLU/RSV  testing. Fact Sheet for Patients: PinkCheek.be Fact Sheet for Healthcare Providers: GravelBags.it This test is not yet approved or cleared by the [place] FDA and  has been authorized for detection and/or diagnosis of SARS-CoV-2 by  FDA under an Emergency Use Authorization (EUA). This EUA will remain  in effect (meaning this test can be used) for the duration of the  Covid-19 declaration under Section 564(b)(1) of the Act, 21  U.S.C. section  360bbb-3(b)(1), unless the authorization is  terminated or revoked. Performed at [facility], [place] [address], [place], [place] [address]   Blood culture (routine x 2)     Status: None (Preliminary result)   Collection Time: 08/04/19  8:58 PM   Specimen: BLOOD  Result Value Ref Range Status   Specimen Description BLOOD RIGHT ANTECUBITAL  Final   Special Requests   Final    BOTTLES DRAWN AEROBIC AND ANAEROBIC Blood Culture adequate volume   Culture   Final    NO GROWTH 4 DAYS Performed at [facility], [place] [address], [place], [place] [address]    Report Status PENDING  Incomplete  Blood culture (routine x 2)     Status: None (Preliminary result)   Collection Time: 08/04/19  9:47 PM   Specimen: BLOOD RIGHT HAND  Result Value Ref Range Status   Specimen Description BLOOD RIGHT HAND  Final   Special Requests   Final    BOTTLES DRAWN AEROBIC AND ANAEROBIC Blood Culture adequate volume   Culture   Final    NO GROWTH 4 DAYS Performed at [facility], [place] [address] [place], [place] [address]    Report Status PENDING  Incomplete  MRSA PCR Screening     Status: None   Collection Time: 08/05/19 12:55 AM   Specimen: Nasal Mucosa; Nasopharyngeal  Result Value Ref Range Status   MRSA by PCR NEGATIVE NEGATIVE Final    Comment:        The GeneXpert MRSA Assay (FDA approved for NASAL specimens only), is one component of a comprehensive MRSA colonization surveillance program. It is not intended to diagnose MRSA infection nor to guide or monitor treatment for MRSA infections. Performed at [facility], [place] [address], [place], [place] [address]   SARS CORONAVIRUS 2 (TAT 6-24 HRS) Nasopharyngeal Nasopharyngeal Swab     Status: None   Collection Time: 08/07/19  6:35 PM   Specimen: Nasopharyngeal Swab  Result Value Ref Range Status   SARS Coronavirus 2 NEGATIVE NEGATIVE Final    Comment: (NOTE) SARS-CoV-2 target nucleic acids are NOT DETECTED. The SARS-CoV-2  RNA is generally detectable in upper and lower respiratory specimens during the acute phase of infection. Negative results do not preclude SARS-CoV-2 infection, do not rule out co-infections with other pathogens, and should not be used as the sole basis for treatment or other patient management decisions. Negative results must be combined with clinical observations, patient history, and epidemiological information. The expected result is Negative. Fact Sheet for Patients: SugarRoll.be Fact Sheet for Healthcare Providers: https://www.woods-mathews.com/ This test is not yet approved or cleared by the [place] FDA and  has been authorized for detection and/or diagnosis of SARS-CoV-2 by FDA under an Emergency Use Authorization (EUA). This EUA will remain  in effect (meaning this test can be used) for  the duration of the COVID-19 declaration under Section 56 4(b)(1) of the Act, 21 U.S.C. section 360bbb-3(b)(1), unless the authorization is terminated or revoked sooner. Performed at [facility], [place] [address], [place], [place] [address]      Radiology Studies: No results found. Benito Mccreedy, MD [facility]  Between 7 am - 7 pm I am available, please contact me via Amion or Securechat  Between 7 pm - 7 am I am not available, please contact night coverage MD/APP via Amion

## 2019-08-09 NOTE — Progress Notes (Signed)
Patient had 5 sec and 4.8 sec pause. It was 80's -100's a-fib then pauses. Now HR 40's -50's. Pt was asymptomatic, he was sleeping. Dr. Vista Lawman notified this matter and d/c prn metoprolol and stat EKG ordered. Continue to monitor pt's HR. HS [employer]

## 2019-08-09 NOTE — Consult Note (Signed)
CARDIOLOGY CONSULT NOTE     Primary Care Physician: Marton Redwood, MD Referring Physician:  Dr Vista Lawman  Admit Date: 08/04/2019  Reason for consultation:  afib and bradycardia  Richard Mitchell is a 79 y.o. male with a h/o HTN, prior stroke, prior SDH s/p evacuation, and seizure disorder.  He has recently had generalized weakness and worsening falls.  Cardiology is consulted for afib and mobitz I second degree AV block. He presented to [facility] and was noted to aspiration pneumonia.  He has been treated with antibiotics with some improvement.  He has chronic dysphagia.  Palliative care was consulted.  He is DNI/DNR.  He is open to hospice services.  have sinus rhythm with first degree AV block.  He has had occasional mobitz I second degree AV block during his hospital stay.  He has also had pp prolongation with episodes of mobitz I AV block suggesting increased vagal tone. He has also had atypical atrial flutter and afib during his hospital stay with a single 5 second post termination pause observed.  The patient is unaware of symptoms with his bradycardia.  Today, he denies symptoms of palpitations, chest pain, shortness of breath,   dizziness, presyncope, syncope.   Past Medical History:  Diagnosis Date  . Anemia   . Cancer ([place])    [place]uss[place]  . Carotid artery occlusion   . Dysphasia S/P CVA (cerebrovascular accident)   . Gait disturbance 11/12/2012  . History of subdural hematoma (post traumatic) Approx 1998  . Hypertension   . Keratosis 11/12/2012   x2  . Onychomycosis 11/12/2012   extreme neglect x 10  . Osteoporosis   . PAD (peripheral artery disease) ([place]) 11/12/2012   possible due to diminished pedal pulses  . Seizure disorder [facility])    Past Surgical History:  Procedure Laterality Date  . CAROTID ENDARTERECTOMY Left 10-13-2009  . SUBDURAL HEMATOMA EVACUATION VIA CRANIOTOMY      . carbamazepine  400 mg Oral BID  . rosuvastatin  20 mg Oral q1800   .  ampicillin-sulbactam (UNASYN) IV 3 g (08/09/19 1543)    No Known Allergies  Social History   Socioeconomic History  . Marital status: Married    Spouse name: Not on file  . Number of children: Not on file  . Years of education: Not on file  . Highest education level: Not on file  Occupational History  . Not on file  Tobacco Use  . Smoking status: Former Smoker    Quit date: 11/19/1987    Years since quitting: 31.7  . Smokeless tobacco: Never Used  Substance and Sexual Activity  . Alcohol use: No    Alcohol/week: 0.0 standard drinks  . Drug use: No  . Sexual activity: Not on file  Other Topics Concern  . Not on file  Social History Narrative  . Not on file   Social Determinants of Health   Financial Resource Strain:   . Difficulty of Paying Living Expenses: Not on file  Food Insecurity:   . Worried About Charity fundraiser in the Last Year: Not on file  . Ran Out of Food in the Last Year: Not on file  Transportation Needs:   . Lack of Transportation (Medical): Not on file  . Lack of Transportation (Non-Medical): Not on file  Physical Activity:   . Days of Exercise per Week: Not on file  . Minutes of Exercise per Session: Not on file  Stress:   . Feeling of Stress : Not  on file  Social Connections:   . Frequency of Communication with Friends and Family: Not on file  . Frequency of Social Gatherings with Friends and Family: Not on file  . Attends Religious Services: Not on file  . Active Member of Clubs or Organizations: Not on file  . Attends Archivist Meetings: Not on file  . Marital Status: Not on file  Intimate Partner Violence:   . Fear of Current or Ex-Partner: Not on file  . Emotionally Abused: Not on file  . Physically Abused: Not on file  . Sexually Abused: Not on file    Family History  Problem Relation Age of Onset  . Heart disease Mother        had pacemaker  . Cancer Mother        Breast  . Heart disease Sister   . Cancer Sister          Pancreatic  . Deep vein thrombosis Sister   . Heart attack Sister   . COPD Father     ROS- All systems are reviewed and negative except as per the HPI above  Physical Exam: Telemetry:  Sinus rhythm,  A single post termination pause as above, mobitz I second degree AV block Vitals:   08/09/19 0757 08/09/19 0935 08/09/19 1100 08/09/19 1544  BP: 91/61 (!) 87/67 106/78 (!) 104/54  Pulse: (!) 117     Resp: (!) 21 (!) 23 17 16   Temp: 97.7 F (36.5 C)  97.7 F (36.5 C) 98.3 F (36.8 C)  TempSrc: Oral  Oral Axillary  SpO2: 90%  91% 97%  Weight:      Height:        GEN- The patient is chronically ill appearing, alert  Head- s/o prior craniotomy Eyes-  Sclera clear, conjunctiva pink Ears- hearing intact Oropharynx- clear Neck- supple  Lungs-   normal work of breathing Heart- Regular rate and rhythm  GI- soft  Extremities- no clubbing, cyanosis, + dependant edema MS- diffuse muscle atrophy Skin- no rash or lesion Psych- flat affect  EKGs in epic are reviewed and reveal sinus bradycardia with first degree AV block as well as episodes of atypical atrial flutter, no ischemic changes  Labs:   Lab Results  Component Value Date   WBC 14.2 (H) 08/07/2019   HGB 11.2 (L) 08/07/2019   HCT 33.9 (L) 08/07/2019   MCV 95.0 08/07/2019   PLT 194 08/07/2019    Recent Labs  Lab 08/07/19 0205  NA 139  K 3.9  CL 106  CO2 22  BUN 17  CREATININE 0.96  CALCIUM 8.6*  PROT 6.4*  BILITOT 0.8  ALKPHOS 71  ALT 14  AST 19  GLUCOSE 91      ASSESSMENT AND PLAN:    1. Mobitz I second degree AV block, sinus bradycardia, single post termination pause Asymptomatic Likely exacerbated by underlying medical conduction. Given advanced age, paucity of symptoms and fragility, I would not advise PPM at this time.  If his bradycardia becomes symptomatic then we could reconsider.  Ultimately, palliative measures would seem appropriate.  2. Atypical atrial flutter/ afib He has converted  to sinus Not a candidate for anticoagulation given frequent falls Avoid AV nodal agents given bradycardia  No further inpatient CV workup is planned Please call with questions Cardiology to see as needed  Thompson Grayer, MD 08/09/2019  4:52 PM

## 2019-08-10 DIAGNOSIS — F05 Delirium due to known physiological condition: Secondary | ICD-10-CM | POA: Diagnosis not present

## 2019-08-10 DIAGNOSIS — I69891 Dysphagia following other cerebrovascular disease: Secondary | ICD-10-CM | POA: Diagnosis not present

## 2019-08-10 DIAGNOSIS — I10 Essential (primary) hypertension: Secondary | ICD-10-CM | POA: Diagnosis not present

## 2019-08-10 DIAGNOSIS — Z515 Encounter for palliative care: Secondary | ICD-10-CM | POA: Diagnosis not present

## 2019-08-10 DIAGNOSIS — I441 Atrioventricular block, second degree: Secondary | ICD-10-CM | POA: Diagnosis not present

## 2019-08-10 DIAGNOSIS — R0989 Other specified symptoms and signs involving the circulatory and respiratory systems: Secondary | ICD-10-CM | POA: Diagnosis not present

## 2019-08-10 DIAGNOSIS — R531 Weakness: Secondary | ICD-10-CM | POA: Diagnosis not present

## 2019-08-10 DIAGNOSIS — M6281 Muscle weakness (generalized): Secondary | ICD-10-CM | POA: Diagnosis not present

## 2019-08-10 DIAGNOSIS — J9601 Acute respiratory failure with hypoxia: Secondary | ICD-10-CM | POA: Diagnosis not present

## 2019-08-10 DIAGNOSIS — I4892 Unspecified atrial flutter: Secondary | ICD-10-CM

## 2019-08-10 DIAGNOSIS — I6930 Unspecified sequelae of cerebral infarction: Secondary | ICD-10-CM | POA: Diagnosis not present

## 2019-08-10 DIAGNOSIS — R627 Adult failure to thrive: Secondary | ICD-10-CM | POA: Diagnosis not present

## 2019-08-10 DIAGNOSIS — G4089 Other seizures: Secondary | ICD-10-CM | POA: Diagnosis not present

## 2019-08-10 DIAGNOSIS — Z66 Do not resuscitate: Secondary | ICD-10-CM | POA: Diagnosis not present

## 2019-08-10 DIAGNOSIS — I480 Paroxysmal atrial fibrillation: Secondary | ICD-10-CM | POA: Diagnosis not present

## 2019-08-10 DIAGNOSIS — R1312 Dysphagia, oropharyngeal phase: Secondary | ICD-10-CM | POA: Diagnosis not present

## 2019-08-10 DIAGNOSIS — I739 Peripheral vascular disease, unspecified: Secondary | ICD-10-CM | POA: Diagnosis not present

## 2019-08-10 DIAGNOSIS — J690 Pneumonitis due to inhalation of food and vomit: Secondary | ICD-10-CM | POA: Diagnosis not present

## 2019-08-10 DIAGNOSIS — R05 Cough: Secondary | ICD-10-CM | POA: Diagnosis not present

## 2019-08-10 LAB — COMPREHENSIVE METABOLIC PANEL
ALT: 16 U/L (ref 0–44)
AST: 19 U/L (ref 15–41)
Albumin: 3 g/dL — ABNORMAL LOW (ref 3.5–5.0)
Alkaline Phosphatase: 66 U/L (ref 38–126)
Anion gap: 12 (ref 5–15)
BUN: 16 mg/dL (ref 8–23)
CO2: 22 mmol/L (ref 22–32)
Calcium: 8.1 mg/dL — ABNORMAL LOW (ref 8.9–10.3)
Chloride: 105 mmol/L (ref 98–111)
Creatinine, Ser: 0.9 mg/dL (ref 0.61–1.24)
GFR calc Af Amer: >60 mL/min (ref >60)
GFR calc non Af Amer: >60 mL/min (ref >60)
Glucose, Bld: 92 mg/dL (ref 70–99)
Potassium: 3.6 mmol/L (ref 3.5–5.1)
Sodium: 139 mmol/L (ref 135–145)
Total Bilirubin: 0.4 mg/dL (ref 0.3–1.2)
Total Protein: 6.3 g/dL — ABNORMAL LOW (ref 6.5–8.1)

## 2019-08-10 LAB — CBC
HCT: 32 % — ABNORMAL LOW (ref 39.0–52.0)
Hemoglobin: 10.6 g/dL — ABNORMAL LOW (ref 13.0–17.0)
MCH: 31.5 pg (ref 26.0–34.0)
MCHC: 33.1 g/dL (ref 30.0–36.0)
MCV: 95 fL (ref 80.0–100.0)
Platelets: 214 10*3/uL (ref 150–400)
RBC: 3.37 MIL/uL — ABNORMAL LOW (ref 4.22–5.81)
RDW: 13.6 % (ref 11.5–15.5)
WBC: 11.9 10*3/uL — ABNORMAL HIGH (ref 4.0–10.5)
nRBC: 0 % (ref 0.0–0.2)

## 2019-08-10 NOTE — Progress Notes (Signed)
All set for transfer to [facility] care, report given to staff "charlene" awaiting transport by [facility].

## 2019-08-10 NOTE — TOC Transition Note (Signed)
Transition of Care [facility]) - CM/SW Discharge Note   Patient Details  Name: Richard Mitchell MRN: [id number] Date of Birth: [birth year]  Transition of Care [facility] CM/SW Contact:  Zenon Mayo, RN Phone Number: 08/10/2019, 2:45 PM   Clinical Narrative:    NCM spoke with [name] at [facility] can take patient today.  NCM will let MD know.  Patient is going to room 105p, Staff RN to call report to [phone].  NCM contacted spouse, Richard Mitchell to let her know patient will be dc to [facility] today. ptar has been scheduled.    Final next level of care: Skilled Nursing Facility Barriers to Discharge: No Barriers Identified   Patient Goals and CMS Choice   CMS Medicare.gov Compare Post Acute Care list provided to:: Patient Represenative (must comment)([name]) Choice offered to / list presented to : Spouse  Discharge Placement              Patient chooses bed at: [facility] Patient to be transferred to facility by: ptar Name of family member notified: Richard Mitchell (wife) Patient and family notified of of transfer: 08/10/19  Discharge Plan and Services     Post Acute Care Choice: [facility]                               Social Determinants of Health (SDOH) Interventions     Readmission Risk Interventions No flowsheet data found.

## 2019-08-10 NOTE — Progress Notes (Signed)
PT Cancellation Note  Patient Details Name: Richard Mitchell MRN: [id number] DOB: [birth year]   Cancelled Treatment:    Reason Eval/Treat Not Completed: Other (comment). Pt preparing for transfer to SNF. Will defer treatment.  Mabeline Caras, PT, DPT Acute Rehabilitation Services  Pager [phone] Office [place] 08/10/2019, 3:26 PM

## 2019-08-10 NOTE — Progress Notes (Signed)
Discharged to [facility]  By [facility] ambulance, belongings with the pt.

## 2019-08-10 NOTE — Discharge Summary (Signed)
Physician Discharge Summary  Richard Mitchell [id number] DOB: [birth year] DOA: 08/04/2019  PCP: Marton Redwood, MD  Admit date: 08/04/2019 Discharge date: 08/10/2019  Admitted From: home with wife Disposition:  SNF   Recommendations for Outpatient Follow-up:  1. 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  Over the last several days prior to admission wife reported that he has been falling very often.  He has a history of dysphagia from prior stroke but seems to have gotten a little bit worse with more coughing recently.  Chest x-ray on admission showed right lung base opacity concerning for aspiration. Pulse ox was 89% on room air. He was admitted to be treated for pneumonia.   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 - this has resolved - He has received 7 days of antibiotics - he needs continued follow up for dysphagia by SLP at SNF - cont D 2 diet  Active Problems:   Essential hypertension - BP has been normal- Olmesartan and HCTZ are on hold  A fib/flutter- paroxysmal Mobitz second degree AV block and sinus bradycardia -  evaluated by Dr Rayann Heman yesterday and recommended to avoid AV nodal agents - not a candidate for anticoagularion     Generalized weakness- frequent falls - he will be discharged to SNF  Seizure disorder - cont Carbamazepine        Discharge Exam: Vitals:   08/10/19 1042 08/10/19 1200  BP: (!) 129/58   Pulse: (!) 56   Resp: 14   Temp: (!) 97.5 F (36.4 C)   SpO2: 90% 90%   Vitals:   08/10/19 0315 08/10/19 0756 08/10/19 1042 08/10/19 1200  BP: 97/61  (!) 129/58   Pulse:  (!) 57 (!) 56   Resp: 15  14   Temp: 98.3 F (36.8 C) 97.8 F (36.6 C) (!) 97.5 F (36.4 C)   TempSrc: Oral Oral Oral   SpO2: 98% 98% 90% 90%  Weight:      Height:        General: Pt is alert, awake, not in acute distress Cardiovascular: RRR, S1/S2 +, no rubs, no gallops Respiratory: CTA bilaterally, no wheezing, no rhonchi Abdominal: Soft, NT, ND, bowel sounds + Extremities: no edema, no cyanosis   Discharge Instructions  Discharge Instructions    Diet - low sodium heart healthy   Complete by: As directed    Increase activity slowly   Complete by: As directed      Allergies as of 08/10/2019   No Known Allergies     Medication List    STOP taking these medications   olmesartan-hydrochlorothiazide 20-12.5 MG tablet  Commonly known as: BENICAR HCT     TAKE these medications   aspirin 81 MG tablet Take 81 mg by mouth daily.   carbamazepine 200 MG tablet Commonly known as: TEGRETOL Take 400 mg by mouth 2 (two) times daily.   CLEAR EYES FOR DRY EYES OP Place 1 drop into both eyes 2 (two) times daily.   rosuvastatin 20 MG tablet Commonly known as: CRESTOR Take 20 mg by mouth daily.   Vitamin D (Ergocalciferol) 1.25 MG (50000 UNIT) Caps capsule Commonly known as: DRISDOL Take 50,000 Units by mouth every 7 (seven) days.       No Known Allergies   Procedures/Studies:    CT Head Wo Contrast  Result Date: 08/04/2019 CLINICAL DATA:  Multiple recent falls. EXAM: CT HEAD  WITHOUT CONTRAST TECHNIQUE: Contiguous axial images were obtained from the base of the skull through the vertex without intravenous contrast. COMPARISON:  11/18/2007 FINDINGS: Brain: Prior right temporal craniectomy. Encephalomalacia in the right temporal and frontoparietal lobes. This is unchanged. There is atrophy and chronic small vessel disease changes. No acute intracranial abnormality. Specifically, no hemorrhage, hydrocephalus, mass lesion, acute infarction, or significant intracranial injury. Vascular: No hyperdense vessel or unexpected calcification. Skull: No acute calvarial abnormality. Sinuses/Orbits: Visualized paranasal sinuses and mastoids clear. Orbital soft tissues unremarkable. Other: None IMPRESSION: Prior right temporal craniectomy with underlying encephalomalacia. Atrophy, chronic microvascular disease. No acute intracranial abnormality. Electronically Signed   By: Rolm Baptise M.D.   On: 08/04/2019 20:39   CT Cervical Spine Wo Contrast  Result Date: 08/04/2019 CLINICAL DATA:  Multiple recent falls EXAM: CT CERVICAL SPINE WITHOUT CONTRAST TECHNIQUE: Multidetector CT imaging of the cervical spine was performed without intravenous contrast. Multiplanar CT image reconstructions were also generated. COMPARISON:  None. FINDINGS: Alignment: Normal Skull base and vertebrae: No acute fracture. No primary bone lesion or focal pathologic process. Soft tissues and spinal canal: No prevertebral fluid or swelling. No visible canal hematoma. Disc levels:  Diffuse advanced degenerative disc and facet disease. Upper chest: Biapical scarring. Other: No acute findings IMPRESSION: Degenerative changes.  No acute bony abnormality. Electronically Signed   By: Rolm Baptise M.D.   On: 08/04/2019 20:41   CT PELVIS WO CONTRAST  Result Date: 08/04/2019 CLINICAL DATA:  Fall EXAM: CT PELVIS WITHOUT CONTRAST TECHNIQUE: Multidetector CT imaging of the pelvis was performed following the standard protocol without  intravenous contrast. COMPARISON:  Plain films today FINDINGS: Urinary Tract: Bladder wall thickening present. There is a 3 cm left lateral bladder wall diverticulum. Visualized distal ureters decompressed. Bowel: Colonic diverticulosis. No active diverticulitis or evidence of bowel obstruction. Moderate stool in the visualized colon. Vascular/Lymphatic: Heavily calcified aorta and iliac vessels. No aneurysm or adenopathy. Reproductive:  Prostate enlargement. Other:  No free fluid or free air. Musculoskeletal: And soft tissue calcifications Corticated bone noted in the soft tissues superior to the right greater trochanter. These are likely related to old avulsion fracture or soft tissue injury. No acute fracture. Moderate symmetric degenerative changes within the hips bilaterally with joint space narrowing and spurring. Degenerative changes in the visualized lower lumbar spine. IMPRESSION: Corticated bone fragments and soft tissue calcifications in the soft tissue superior to the right greater trochanter, likely related to old injury. No acute fracture seen. Moderate degenerative changes in the hips bilaterally. Colonic diverticulosis. Prostate prominence. Associated bladder wall thickening and left lateral bladder wall diverticulum. Electronically Signed   By: Rolm Baptise M.D.   On: 08/04/2019 20:55   DG Pelvis Portable  Result Date: 08/04/2019  CLINICAL DATA:  Pain following fall EXAM: PORTABLE PELVIS 1-2 VIEWS COMPARISON:  None. FINDINGS: There is no evidence of acute pelvic fracture or dislocation. There is evidence of prior avulsion along the greater trochanter with bony fragments in the lateral aspect of the elbow joint, chronic in appearance. There is moderate narrowing of the hip joints, slightly more severe on the right than on the left. No erosive change. Bones are osteoporotic. Multiple foci of vascular calcification noted in the distal aorta as well as in iliac and femoral artery branches.  IMPRESSION: Prior avulsion along the greater trochanter on the right with bony fragments in the lateral aspect of the right elbow joint, chronic in appearance. No acute appearing fracture or dislocation. Narrowing of each hip joint noted, more severe on the right than on the left. Bones osteoporotic. Multifocal arterial vascular calcification noted. Electronically Signed   By: Lowella Grip III M.D.   On: 08/04/2019 19:25   DG Chest Port 1 View  Result Date: 08/04/2019 CLINICAL DATA:  Pain following fall EXAM: PORTABLE CHEST 1 VIEW COMPARISON:  Chest radiograph October 12, 2009 and chest CT March 29, 2010 FINDINGS: There is patchy airspace opacity in the right base. There is a degree of underlying emphysematous change, better seen on prior CT. Heart is mildly enlarged with pulmonary vascularity normal. No adenopathy. There is aortic atherosclerosis. Bones are osteoporotic. IMPRESSION: Patchy airspace opacity consistent with pneumonia right base. Underlying emphysematous change. Mild cardiomegaly. Aortic Atherosclerosis (ICD10-I70.0). Electronically Signed   By: Lowella Grip III M.D.   On: 08/04/2019 19:27     The results of significant diagnostics from this hospitalization (including imaging, microbiology, ancillary and laboratory) are listed below for reference.     Microbiology: Recent Results (from the past 240 hour(s))  Urine Culture     Status: Abnormal   Collection Time: 08/04/19  7:10 PM   Specimen: Urine, Random  Result Value Ref Range Status   Specimen Description URINE, RANDOM  Final   Special Requests   Final    NONE Performed at [facility], [address], [place], [place] [address]    Culture MULTIPLE SPECIES PRESENT, SUGGEST RECOLLECTION (A)  Final   Report Status 08/06/2019 FINAL  Final  Respiratory Panel by RT PCR (Flu A&B, Covid) - Nasopharyngeal Swab     Status: None   Collection Time: 08/04/19  7:24 PM   Specimen: Nasopharyngeal Swab  Result Value Ref  Range Status   SARS Coronavirus 2 by RT PCR NEGATIVE NEGATIVE Final    Comment: (NOTE) SARS-CoV-2 target nucleic acids are NOT DETECTED. The SARS-CoV-2 RNA is generally detectable in upper respiratoy specimens during the acute phase of infection. The lowest concentration of SARS-CoV-2 viral copies this assay can detect is 131 copies/mL. A negative result does not preclude SARS-Cov-2 infection and should not be used as the sole basis for treatment or other patient management decisions. A negative result may occur with  improper specimen collection/handling, submission of specimen other than nasopharyngeal swab, presence of viral mutation(s) within the areas targeted by this assay, and inadequate number of viral copies (<131 copies/mL). A negative result must be combined with clinical observations, patient history, and epidemiological information. The expected result is Negative. Fact Sheet for Patients:  PinkCheek.be Fact Sheet for Healthcare Providers:  GravelBags.it This test is not yet ap proved or cleared by the [place] FDA and  has been authorized for detection and/or diagnosis of SARS-CoV-2 by FDA under an Emergency Use Authorization (EUA). This  EUA will remain  in effect (meaning this test can be used) for the duration of the COVID-19 declaration under Section 564(b)(1) of the Act, 21 U.S.C. section 360bbb-3(b)(1), unless the authorization is terminated or revoked sooner.    Influenza A by PCR NEGATIVE NEGATIVE Final   Influenza B by PCR NEGATIVE NEGATIVE Final    Comment: (NOTE) The Xpert Xpress SARS-CoV-2/FLU/RSV assay is intended as an aid in  the diagnosis of influenza from Nasopharyngeal swab specimens and  should not be used as a sole basis for treatment. Nasal washings and  aspirates are unacceptable for Xpert Xpress SARS-CoV-2/FLU/RSV  testing. Fact Sheet for  Patients: PinkCheek.be Fact Sheet for Healthcare Providers: GravelBags.it This test is not yet approved or cleared by the [place] FDA and  has been authorized for detection and/or diagnosis of SARS-CoV-2 by  FDA under an Emergency Use Authorization (EUA). This EUA will remain  in effect (meaning this test can be used) for the duration of the  Covid-19 declaration under Section 564(b)(1) of the Act, 21  U.S.C. section 360bbb-3(b)(1), unless the authorization is  terminated or revoked. Performed at [facility], [place] [address] [place], [place] [address]   Blood culture (routine x 2)     Status: None   Collection Time: 08/04/19  8:58 PM   Specimen: BLOOD  Result Value Ref Range Status   Specimen Description BLOOD RIGHT ANTECUBITAL  Final   Special Requests   Final    BOTTLES DRAWN AEROBIC AND ANAEROBIC Blood Culture adequate volume   Culture   Final    NO GROWTH 5 DAYS Performed at [facility], [place] [address] [place], [place] [address]    Report Status 08/09/2019 FINAL  Final  Blood culture (routine x 2)     Status: None   Collection Time: 08/04/19  9:47 PM   Specimen: BLOOD RIGHT HAND  Result Value Ref Range Status   Specimen Description BLOOD RIGHT HAND  Final   Special Requests   Final    BOTTLES DRAWN AEROBIC AND ANAEROBIC Blood Culture adequate volume   Culture   Final    NO GROWTH 5 DAYS Performed at [facility], [place] [address], [place], [place] [address]    Report Status 08/09/2019 FINAL  Final  MRSA PCR Screening     Status: None   Collection Time: 08/05/19 12:55 AM   Specimen: Nasal Mucosa; Nasopharyngeal  Result Value Ref Range Status   MRSA by PCR NEGATIVE NEGATIVE Final    Comment:        The GeneXpert MRSA Assay (FDA approved for NASAL specimens only), is one component of a comprehensive MRSA colonization surveillance program. It is not intended to diagnose MRSA infection  nor to guide or monitor treatment for MRSA infections. Performed at [facility], [place] [address] [place], [place] [address]   SARS CORONAVIRUS 2 (TAT 6-24 HRS) Nasopharyngeal Nasopharyngeal Swab     Status: None   Collection Time: 08/07/19  6:35 PM   Specimen: Nasopharyngeal Swab  Result Value Ref Range Status   SARS Coronavirus 2 NEGATIVE NEGATIVE Final    Comment: (NOTE) SARS-CoV-2 target nucleic acids are NOT DETECTED. The SARS-CoV-2 RNA is generally detectable in upper and lower respiratory specimens during the acute phase of infection. Negative results do not preclude SARS-CoV-2 infection, do not rule out co-infections with other pathogens, and should not be used as the sole basis for treatment or other patient management decisions. Negative results must  be combined with clinical observations, patient history, and epidemiological information. The expected result is Negative. Fact Sheet for Patients: SugarRoll.be Fact Sheet for Healthcare Providers: https://www.woods-mathews.com/ This test is not yet approved or cleared by the [place] FDA and  has been authorized for detection and/or diagnosis of SARS-CoV-2 by FDA under an Emergency Use Authorization (EUA). This EUA will remain  in effect (meaning this test can be used) for the duration of the COVID-19 declaration under Section 56 4(b)(1) of the Act, 21 U.S.C. section 360bbb-3(b)(1), unless the authorization is terminated or revoked sooner. Performed at [facility], [place] [address] [place], [place] [address]      Labs: BNP (last 3 results) No results for input(s): BNP in the last 8760 hours. Basic Metabolic Panel: Recent Labs  Lab 08/04/19 1905 08/06/19 0215 08/07/19 0205 08/10/19 0228  NA 135 138 139 139  K 4.9 4.4 3.9 3.6  CL 102 108 106 105  CO2 23 21* 22 22  GLUCOSE 118* 92 91 92  BUN 33* 14 17 16   CREATININE 1.17 0.81 0.96 0.90  CALCIUM 8.9 8.9  8.6* 8.1*   Liver Function Tests: Recent Labs  Lab 08/04/19 1905 08/07/19 0205 08/10/19 0228  AST 17 19 19   ALT 15 14 16   ALKPHOS 66 71 66  BILITOT 0.4 0.8 0.4  PROT 6.8 6.4* 6.3*  ALBUMIN 3.8 3.3* 3.0*   No results for input(s): LIPASE, AMYLASE in the last 168 hours. No results for input(s): AMMONIA in the last 168 hours. CBC: Recent Labs  Lab 08/04/19 1905 08/05/19 0211 08/06/19 0215 08/07/19 0205 08/10/19 0228  WBC 13.0* 11.0* 8.9 14.2* 11.9*  NEUTROABS 10.7*  --   --   --   --   HGB 10.8* 10.3* 11.2* 11.2* 10.6*  HCT 34.3* 31.9* 34.4* 33.9* 32.0*  MCV 99.4 96.4 95.6 95.0 95.0  PLT 199 186 208 194 214   Cardiac Enzymes: No results for input(s): CKTOTAL, CKMB, CKMBINDEX, TROPONINI in the last 168 hours. BNP: Invalid input(s): POCBNP CBG: No results for input(s): GLUCAP in the last 168 hours. D-Dimer No results for input(s): DDIMER in the last 72 hours. Hgb A1c No results for input(s): HGBA1C in the last 72 hours. Lipid Profile No results for input(s): CHOL, HDL, LDLCALC, TRIG, CHOLHDL, LDLDIRECT in the last 72 hours. Thyroid function studies No results for input(s): TSH, T4TOTAL, T3FREE, THYROIDAB in the last 72 hours.  Invalid input(s): FREET3 Anemia work up No results for input(s): VITAMINB12, FOLATE, FERRITIN, TIBC, IRON, RETICCTPCT in the last 72 hours. Urinalysis    Component Value Date/Time   COLORURINE YELLOW 08/09/2019 1444   APPEARANCEUR CLEAR 08/09/2019 1444   LABSPEC 1.021 08/09/2019 1444   PHURINE 5.0 08/09/2019 1444   GLUCOSEU NEGATIVE 08/09/2019 1444   HGBUR NEGATIVE 08/09/2019 1444   BILIRUBINUR NEGATIVE 08/09/2019 1444   KETONESUR NEGATIVE 08/09/2019 1444   PROTEINUR NEGATIVE 08/09/2019 1444   UROBILINOGEN 1.0 10/12/2009 1207   NITRITE NEGATIVE 08/09/2019 1444   LEUKOCYTESUR NEGATIVE 08/09/2019 1444   Sepsis Labs Invalid input(s): PROCALCITONIN,  WBC,  LACTICIDVEN Microbiology Recent Results (from the past 240 hour(s))  Urine  Culture     Status: Abnormal   Collection Time: 08/04/19  7:10 PM   Specimen: Urine, Random  Result Value Ref Range Status   Specimen Description URINE, RANDOM  Final   Special Requests   Final    NONE Performed at [facility], [place] [address], [place], [place] [address]    Culture  MULTIPLE SPECIES PRESENT, SUGGEST RECOLLECTION (A)  Final   Report Status 08/06/2019 FINAL  Final  Respiratory Panel by RT PCR (Flu A&B, Covid) - Nasopharyngeal Swab     Status: None   Collection Time: 08/04/19  7:24 PM   Specimen: Nasopharyngeal Swab  Result Value Ref Range Status   SARS Coronavirus 2 by RT PCR NEGATIVE NEGATIVE Final    Comment: (NOTE) SARS-CoV-2 target nucleic acids are NOT DETECTED. The SARS-CoV-2 RNA is generally detectable in upper respiratoy specimens during the acute phase of infection. The lowest concentration of SARS-CoV-2 viral copies this assay can detect is 131 copies/mL. A negative result does not preclude SARS-Cov-2 infection and should not be used as the sole basis for treatment or other patient management decisions. A negative result may occur with  improper specimen collection/handling, submission of specimen other than nasopharyngeal swab, presence of viral mutation(s) within the areas targeted by this assay, and inadequate number of viral copies (<131 copies/mL). A negative result must be combined with clinical observations, patient history, and epidemiological information. The expected result is Negative. Fact Sheet for Patients:  PinkCheek.be Fact Sheet for Healthcare Providers:  GravelBags.it This test is not yet ap proved or cleared by the [place] FDA and  has been authorized for detection and/or diagnosis of SARS-CoV-2 by FDA under an Emergency Use Authorization (EUA). This EUA will remain  in effect (meaning this test can be used) for the duration of the COVID-19 declaration under Section  564(b)(1) of the Act, 21 U.S.C. section 360bbb-3(b)(1), unless the authorization is terminated or revoked sooner.    Influenza A by PCR NEGATIVE NEGATIVE Final   Influenza B by PCR NEGATIVE NEGATIVE Final    Comment: (NOTE) The Xpert Xpress SARS-CoV-2/FLU/RSV assay is intended as an aid in  the diagnosis of influenza from Nasopharyngeal swab specimens and  should not be used as a sole basis for treatment. Nasal washings and  aspirates are unacceptable for Xpert Xpress SARS-CoV-2/FLU/RSV  testing. Fact Sheet for Patients: PinkCheek.be Fact Sheet for Healthcare Providers: GravelBags.it This test is not yet approved or cleared by the [place] FDA and  has been authorized for detection and/or diagnosis of SARS-CoV-2 by  FDA under an Emergency Use Authorization (EUA). This EUA will remain  in effect (meaning this test can be used) for the duration of the  Covid-19 declaration under Section 564(b)(1) of the Act, 21  U.S.C. section 360bbb-3(b)(1), unless the authorization is  terminated or revoked. Performed at [facility], [place] [address], [place], [place] [address]   Blood culture (routine x 2)     Status: None   Collection Time: 08/04/19  8:58 PM   Specimen: BLOOD  Result Value Ref Range Status   Specimen Description BLOOD RIGHT ANTECUBITAL  Final   Special Requests   Final    BOTTLES DRAWN AEROBIC AND ANAEROBIC Blood Culture adequate volume   Culture   Final    NO GROWTH 5 DAYS Performed at [facility], [place] [address] [place], [place] [address]    Report Status 08/09/2019 FINAL  Final  Blood culture (routine x 2)     Status: None   Collection Time: 08/04/19  9:47 PM   Specimen: BLOOD RIGHT HAND  Result Value Ref Range Status   Specimen Description BLOOD RIGHT HAND  Final   Special Requests   Final    BOTTLES DRAWN AEROBIC AND ANAEROBIC Blood Culture adequate volume   Culture   Final    NO  GROWTH 5  DAYS Performed at [facility], [place] [address], [place], [place] [address]    Report Status 08/09/2019 FINAL  Final  MRSA PCR Screening     Status: None   Collection Time: 08/05/19 12:55 AM   Specimen: Nasal Mucosa; Nasopharyngeal  Result Value Ref Range Status   MRSA by PCR NEGATIVE NEGATIVE Final    Comment:        The GeneXpert MRSA Assay (FDA approved for NASAL specimens only), is one component of a comprehensive MRSA colonization surveillance program. It is not intended to diagnose MRSA infection nor to guide or monitor treatment for MRSA infections. Performed at [facility], [place] [address] [place], [place] [address]   SARS CORONAVIRUS 2 (TAT 6-24 HRS) Nasopharyngeal Nasopharyngeal Swab     Status: None   Collection Time: 08/07/19  6:35 PM   Specimen: Nasopharyngeal Swab  Result Value Ref Range Status   SARS Coronavirus 2 NEGATIVE NEGATIVE Final    Comment: (NOTE) SARS-CoV-2 target nucleic acids are NOT DETECTED. The SARS-CoV-2 RNA is generally detectable in upper and lower respiratory specimens during the acute phase of infection. Negative results do not preclude SARS-CoV-2 infection, do not rule out co-infections with other pathogens, and should not be used as the sole basis for treatment or other patient management decisions. Negative results must be combined with clinical observations, patient history, and epidemiological information. The expected result is Negative. Fact Sheet for Patients: SugarRoll.be Fact Sheet for Healthcare Providers: https://www.woods-mathews.com/ This test is not yet approved or cleared by the [place] FDA and  has been authorized for detection and/or diagnosis of SARS-CoV-2 by FDA under an Emergency Use Authorization (EUA). This EUA will remain  in effect (meaning this test can be used) for the duration of the COVID-19 declaration under Section 56 4(b)(1) of the Act, 21  U.S.C. section 360bbb-3(b)(1), unless the authorization is terminated or revoked sooner. Performed at [facility], [place] [address], [place], [place] [address]      Time coordinating discharge in minutes: 62  SIGNED:   Debbe Odea, MD  [facility] 08/10/2019, 12:21 PM Pager   If 7PM-7AM, please contact night-coverage [email or web address] Password [email or web address]

## 2019-08-10 NOTE — TOC Progression Note (Addendum)
Transition of Care [facility]) - Progression Note    Patient Details  Name: Richard Mitchell MRN: [id number] Date of Birth: 10-06-1940  Transition of Care [facility]) CM/SW Contact  Richard Mayo, RN Phone Number: 08/10/2019, 11:52 AM  Clinical Narrative:    NCM spoke with [name] at [facility] they can take patient today.  NCM will let MD know.  Patient is going to room 105p, Staff RN to call report to [phone].  NCM contacted spouse, Richard Mitchell to let her know patient will be dc to [facility] today. ptar has been scheduled.    Expected Discharge Plan: [place] Barriers to Discharge: Continued Medical Work up  Expected Discharge Plan and Services Expected Discharge Plan: [place] Choice: [place] arrangements for the past 2 months: Single Family Home                                       Social Determinants of Health (SDOH) Interventions    Readmission Risk Interventions No flowsheet data found.

## 2019-08-12 ENCOUNTER — Telehealth: Payer: Self-pay | Admitting: Licensed Clinical Social Worker

## 2019-08-12 NOTE — Telephone Encounter (Signed)
Palliative Care SW left a vm for patient's wife, [name], to schedule a visit.

## 2019-08-13 ENCOUNTER — Non-Acute Institutional Stay: Payer: PPO | Admitting: *Deleted

## 2019-08-13 ENCOUNTER — Other Ambulatory Visit: Payer: Self-pay

## 2019-08-13 DIAGNOSIS — Z515 Encounter for palliative care: Secondary | ICD-10-CM

## 2019-08-13 DIAGNOSIS — I10 Essential (primary) hypertension: Secondary | ICD-10-CM | POA: Diagnosis not present

## 2019-08-13 DIAGNOSIS — R1312 Dysphagia, oropharyngeal phase: Secondary | ICD-10-CM | POA: Diagnosis not present

## 2019-08-13 DIAGNOSIS — I480 Paroxysmal atrial fibrillation: Secondary | ICD-10-CM | POA: Diagnosis not present

## 2019-08-13 DIAGNOSIS — R531 Weakness: Secondary | ICD-10-CM | POA: Diagnosis not present

## 2019-08-17 NOTE — Progress Notes (Signed)
COMMUNITY PALLIATIVE CARE RN NOTE  PATIENT NAME: AJAY STRUBEL DOB: 10-14-1940 MRN: [id number]  PRIMARY CARE PROVIDER: Marton Redwood, MD  RESPONSIBLE PARTY:  Acct ID - Guarantor Home Phone Work Phone Relationship Acct Type  [id number] - Kimery,BOBB* [phone]  Self P/F     [address], [name], [place] [address]   Covid-19 Pre-screening Negative  PLAN OF CARE and INTERVENTION:  1. ADVANCE CARE PLANNING/GOALS OF CARE: Goal is for patient to return back home with his wife when rehab is complete. 2. PATIENT/CAREGIVER EDUCATION: Explained Palliative care services 3. DISEASE STATUS: Met with patient in his room at the facility. Upon arrival, patient is lying in bed awake and alert. He is able to engage in appropriate conversation and make his needs known. He reports pain in his left side/rib cage area. He says that this pain interfered with him sleeping last night. He reports that he has been awake since about 1:30 am. Tylenol has been effective, per patient. He is ambulatory using his walker. He worked with PT this morning and was very pleased with his session and says that the PT said he did a great job. He does not attempt to walk by himself d/t his history of falls so will press the call button for assistance. He requires some assistance with bathing and dressing. He is able to feed himself independently. His intake is fair. He says that he does have some difficulties swallowing at times. He does better with his meats cut in small pieces and soft side dishes. He is able to drink thin liquids. He says that he is continent for the most part of both bowel and bladder, however still wears Depends as sometimes he is unable to make it to the bathroom quick enough to urinate so may leak. No edema noted. Respirations are even and unlabored. He is very pleasant throughout visit. He requested a ginger ale at end of visit and I did bring him one back to his room. Will continue to monitor.   HISTORY OF  PRESENT ILLNESS: This is a 79 yo male who currently resides at [facility] for rehab. He was hospitalized from home on 08/04/19 to 08/10/19 for aspiration pneumonia and discharged to the SNF. Palliative care asked to follow patient for additional support and goals of care. Will visit patient monthly and PRN.   CODE STATUS: DNR  ADVANCED DIRECTIVES: N MOST FORM: no PPS: 50%   PHYSICAL EXAM:   LUNGS: clear to auscultation  CARDIAC: Cor RRR EXTREMITIES: No edema SKIN: Exposed skin is dry and intact  NEURO: Alert and oriented x 3, pleasant mood, generalized weakness, unsteady gait, ambulatory w/walker and stand-by assistance   (Duration of visit and documentation 60 minutes)   Daryl Eastern, RN BSN

## 2019-08-21 ENCOUNTER — Other Ambulatory Visit: Payer: Self-pay

## 2019-08-21 ENCOUNTER — Non-Acute Institutional Stay: Payer: PPO | Admitting: *Deleted

## 2019-08-21 DIAGNOSIS — Z515 Encounter for palliative care: Secondary | ICD-10-CM

## 2019-08-25 NOTE — Progress Notes (Signed)
COMMUNITY PALLIATIVE CARE RN NOTE  PATIENT NAME: Richard Mitchell DOB: [birth year] MRN: [id number]  PRIMARY CARE PROVIDER: Marton Redwood, MD  RESPONSIBLE PARTY:  Acct ID - Guarantor Home Phone Work Phone Relationship Acct Type  [id number] - Honeycutt,BOBB* [phone]  Self P/F     [address], [name], [place] [address]   Covid-19 Pre-screening Negative  PLAN OF CARE and INTERVENTION:  1. ADVANCE CARE PLANNING/GOALS OF CARE: Goal is for patient to return home with his wife once Rehab is completed. 2. PATIENT/CAREGIVER EDUCATION: Safe Mobility, symptom management 3. DISEASE STATUS: Met with patient at the facility in his room. Upon arrival, he is sitting up on the side of the bed awake and alert. Last week he was experiencing pain in his left side/rib area but denies pain today. He has PRN Tylenol available which helps when needed. He continues to work with PT/OT. He feels that he is getting stronger and therapy is helping. He reports that he was able to stand at the sink and give himself his own sponge bath. He also states that he is more steady and no longer has to call the staff for assistance prior to moving about in his room. He is eager to return home, however facility would like patient to receive the full course of therapy while he is currently under his Medicare days. Advised that the discharge planner will set a meeting with his wife to discuss discharge plans. His intake is fair. He does have some dysphagia. He eats a mechanical soft diet with chopped meats with thin liquids. He is continent of bowels with an occasional occurrence of bladder incontinence. Will continue to monitor.   HISTORY OF PRESENT ILLNESS:  This is a 79 yo male who currently resides at [facility] for rehab. Palliative care team continues to follow patient. Will continue to visit patient monthly and PRN.  CODE STATUS: DNR ADVANCED DIRECTIVES: N MOST FORM: no PPS: 50%   PHYSICAL EXAM:   LUNGS: clear to  auscultation  CARDIAC: Cor RRR EXTREMITIES: No edema SKIN: Exposed skin is dry and intact  NEURO: Alert and oriented x 3, pleasant mood, generalized weakness, ambulatory w/walker   (Duration of visit and documentation 45 minutes)   Daryl Eastern, RN BSN

## 2019-08-28 DIAGNOSIS — F05 Delirium due to known physiological condition: Secondary | ICD-10-CM | POA: Diagnosis not present

## 2019-08-28 DIAGNOSIS — I6930 Unspecified sequelae of cerebral infarction: Secondary | ICD-10-CM | POA: Diagnosis not present

## 2019-08-28 DIAGNOSIS — R05 Cough: Secondary | ICD-10-CM | POA: Diagnosis not present

## 2019-08-28 DIAGNOSIS — R0989 Other specified symptoms and signs involving the circulatory and respiratory systems: Secondary | ICD-10-CM | POA: Diagnosis not present

## 2019-09-03 DIAGNOSIS — I10 Essential (primary) hypertension: Secondary | ICD-10-CM | POA: Diagnosis not present

## 2019-09-03 DIAGNOSIS — J690 Pneumonitis due to inhalation of food and vomit: Secondary | ICD-10-CM | POA: Diagnosis not present

## 2019-09-03 DIAGNOSIS — R627 Adult failure to thrive: Secondary | ICD-10-CM | POA: Diagnosis not present

## 2019-09-03 DIAGNOSIS — J9601 Acute respiratory failure with hypoxia: Secondary | ICD-10-CM | POA: Diagnosis not present

## 2019-09-08 DIAGNOSIS — I739 Peripheral vascular disease, unspecified: Secondary | ICD-10-CM | POA: Diagnosis not present

## 2019-09-08 DIAGNOSIS — J9691 Respiratory failure, unspecified with hypoxia: Secondary | ICD-10-CM | POA: Diagnosis not present

## 2019-09-08 DIAGNOSIS — I4892 Unspecified atrial flutter: Secondary | ICD-10-CM | POA: Diagnosis not present

## 2019-09-08 DIAGNOSIS — I480 Paroxysmal atrial fibrillation: Secondary | ICD-10-CM | POA: Diagnosis not present

## 2019-09-08 DIAGNOSIS — D649 Anemia, unspecified: Secondary | ICD-10-CM | POA: Diagnosis not present

## 2019-09-08 DIAGNOSIS — I69391 Dysphagia following cerebral infarction: Secondary | ICD-10-CM | POA: Diagnosis not present

## 2019-09-08 DIAGNOSIS — I441 Atrioventricular block, second degree: Secondary | ICD-10-CM | POA: Diagnosis not present

## 2019-09-08 DIAGNOSIS — Z7982 Long term (current) use of aspirin: Secondary | ICD-10-CM | POA: Diagnosis not present

## 2019-09-08 DIAGNOSIS — Z8701 Personal history of pneumonia (recurrent): Secondary | ICD-10-CM | POA: Diagnosis not present

## 2019-09-08 DIAGNOSIS — Z993 Dependence on wheelchair: Secondary | ICD-10-CM | POA: Diagnosis not present

## 2019-09-08 DIAGNOSIS — G40909 Epilepsy, unspecified, not intractable, without status epilepticus: Secondary | ICD-10-CM | POA: Diagnosis not present

## 2019-09-08 DIAGNOSIS — I10 Essential (primary) hypertension: Secondary | ICD-10-CM | POA: Diagnosis not present

## 2019-09-08 DIAGNOSIS — M810 Age-related osteoporosis without current pathological fracture: Secondary | ICD-10-CM | POA: Diagnosis not present

## 2019-09-08 DIAGNOSIS — Z9181 History of falling: Secondary | ICD-10-CM | POA: Diagnosis not present

## 2019-09-08 DIAGNOSIS — R1310 Dysphagia, unspecified: Secondary | ICD-10-CM | POA: Diagnosis not present

## 2019-09-09 DIAGNOSIS — M6281 Muscle weakness (generalized): Secondary | ICD-10-CM | POA: Diagnosis not present

## 2019-09-09 DIAGNOSIS — J690 Pneumonitis due to inhalation of food and vomit: Secondary | ICD-10-CM | POA: Diagnosis not present

## 2019-09-09 DIAGNOSIS — I480 Paroxysmal atrial fibrillation: Secondary | ICD-10-CM | POA: Diagnosis not present

## 2019-09-09 DIAGNOSIS — I10 Essential (primary) hypertension: Secondary | ICD-10-CM | POA: Diagnosis not present

## 2019-09-15 ENCOUNTER — Telehealth: Payer: Self-pay

## 2019-09-15 NOTE — Telephone Encounter (Signed)
Telephone call to schedule palliative care visit.  Wife in agreement with palliative care team making home visit 09/22/19 at 11:15 AM.

## 2019-09-17 DIAGNOSIS — I4892 Unspecified atrial flutter: Secondary | ICD-10-CM | POA: Diagnosis not present

## 2019-09-17 DIAGNOSIS — R1310 Dysphagia, unspecified: Secondary | ICD-10-CM | POA: Diagnosis not present

## 2019-09-17 DIAGNOSIS — I69391 Dysphagia following cerebral infarction: Secondary | ICD-10-CM | POA: Diagnosis not present

## 2019-09-17 DIAGNOSIS — J9691 Respiratory failure, unspecified with hypoxia: Secondary | ICD-10-CM | POA: Diagnosis not present

## 2019-09-17 DIAGNOSIS — I480 Paroxysmal atrial fibrillation: Secondary | ICD-10-CM | POA: Diagnosis not present

## 2019-09-17 DIAGNOSIS — Z7982 Long term (current) use of aspirin: Secondary | ICD-10-CM | POA: Diagnosis not present

## 2019-09-17 DIAGNOSIS — G40909 Epilepsy, unspecified, not intractable, without status epilepticus: Secondary | ICD-10-CM | POA: Diagnosis not present

## 2019-09-17 DIAGNOSIS — M810 Age-related osteoporosis without current pathological fracture: Secondary | ICD-10-CM | POA: Diagnosis not present

## 2019-09-17 DIAGNOSIS — I441 Atrioventricular block, second degree: Secondary | ICD-10-CM | POA: Diagnosis not present

## 2019-09-17 DIAGNOSIS — Z8701 Personal history of pneumonia (recurrent): Secondary | ICD-10-CM | POA: Diagnosis not present

## 2019-09-17 DIAGNOSIS — Z9181 History of falling: Secondary | ICD-10-CM | POA: Diagnosis not present

## 2019-09-17 DIAGNOSIS — I10 Essential (primary) hypertension: Secondary | ICD-10-CM | POA: Diagnosis not present

## 2019-09-17 DIAGNOSIS — D649 Anemia, unspecified: Secondary | ICD-10-CM | POA: Diagnosis not present

## 2019-09-17 DIAGNOSIS — I739 Peripheral vascular disease, unspecified: Secondary | ICD-10-CM | POA: Diagnosis not present

## 2019-09-17 DIAGNOSIS — Z993 Dependence on wheelchair: Secondary | ICD-10-CM | POA: Diagnosis not present

## 2019-09-22 ENCOUNTER — Other Ambulatory Visit: Payer: PPO

## 2019-09-22 ENCOUNTER — Other Ambulatory Visit: Payer: Self-pay

## 2019-09-22 DIAGNOSIS — Z8701 Personal history of pneumonia (recurrent): Secondary | ICD-10-CM | POA: Diagnosis not present

## 2019-09-22 DIAGNOSIS — J9691 Respiratory failure, unspecified with hypoxia: Secondary | ICD-10-CM | POA: Diagnosis not present

## 2019-09-22 DIAGNOSIS — I4892 Unspecified atrial flutter: Secondary | ICD-10-CM | POA: Diagnosis not present

## 2019-09-22 DIAGNOSIS — I480 Paroxysmal atrial fibrillation: Secondary | ICD-10-CM | POA: Diagnosis not present

## 2019-09-22 DIAGNOSIS — D649 Anemia, unspecified: Secondary | ICD-10-CM | POA: Diagnosis not present

## 2019-09-22 DIAGNOSIS — I10 Essential (primary) hypertension: Secondary | ICD-10-CM | POA: Diagnosis not present

## 2019-09-22 DIAGNOSIS — M810 Age-related osteoporosis without current pathological fracture: Secondary | ICD-10-CM | POA: Diagnosis not present

## 2019-09-22 DIAGNOSIS — I739 Peripheral vascular disease, unspecified: Secondary | ICD-10-CM | POA: Diagnosis not present

## 2019-09-22 DIAGNOSIS — Z993 Dependence on wheelchair: Secondary | ICD-10-CM | POA: Diagnosis not present

## 2019-09-22 DIAGNOSIS — Z7982 Long term (current) use of aspirin: Secondary | ICD-10-CM | POA: Diagnosis not present

## 2019-09-22 DIAGNOSIS — Z515 Encounter for palliative care: Secondary | ICD-10-CM

## 2019-09-22 DIAGNOSIS — G40909 Epilepsy, unspecified, not intractable, without status epilepticus: Secondary | ICD-10-CM | POA: Diagnosis not present

## 2019-09-22 DIAGNOSIS — Z9181 History of falling: Secondary | ICD-10-CM | POA: Diagnosis not present

## 2019-09-22 DIAGNOSIS — I69391 Dysphagia following cerebral infarction: Secondary | ICD-10-CM | POA: Diagnosis not present

## 2019-09-22 DIAGNOSIS — I441 Atrioventricular block, second degree: Secondary | ICD-10-CM | POA: Diagnosis not present

## 2019-09-22 DIAGNOSIS — R1310 Dysphagia, unspecified: Secondary | ICD-10-CM | POA: Diagnosis not present

## 2019-09-22 NOTE — Progress Notes (Signed)
COMMUNITY PALLIATIVE CARE SW NOTE  PATIENT NAME: Richard Mitchell DOB: September 28, 1940 MRN: [id number]  PRIMARY CARE PROVIDER: Marton Redwood, MD  RESPONSIBLE PARTY:  Acct ID - Guarantor Home Phone Work Phone Relationship Acct Type  [id number] - Self,[phone]  Self P/F     [address], [place], [place] [address]     PLAN OF CARE and INTERVENTIONS:             1. GOALS OF CARE/ ADVANCE CARE PLANNING:  Patient is a DNR. HCPOA/Living Will discussed, SW to bring copy. Goal is to "keep living" and gain more strength.  2. SOCIAL/EMOTIONAL/SPIRITUAL ASSESSMENT/ INTERVENTIONS:  SW met with patient and [name] (patient's wife) in the home. Patient returned home on 2/26 from [facility]. Discussed patient's hospitalization in January and discharged to the facility for rehab. Patient denies pain. Patient takes tylenol daily, for chronic lower back pain. No falls reported, discussed safety. [name] noted she checks patient's BP every morning and it was higher this morning. Patient has a good appetite, eating pureed food. Patient is sleeping well, naps during the day. Reviewed patient's medications. SW provided education on palliative care, discussed care and goals, and used active and reflective listening.  3. PATIENT/CAREGIVER EDUCATION/ COPING:  Patient is alert, oriented. Patient is happy to be home. Denied coping concerns. Wife is very supportive.  4. PERSONAL EMERGENCY PLAN:  Family will call 9-1-1 for emergencies. 5. COMMUNITY RESOURCES COORDINATION/ HEALTH CARE NAVIGATION:  Patient is receiving home health PT with [facility]. [name] has requested Speech Therapy as well. Patient is scheduled to see PCP in April. Discussed veteran services, SW to bring information.  6. FINANCIAL/LEGAL CONCERNS/INTERVENTIONS:  None indicated.     SOCIAL HX:  Social History   Tobacco Use  . Smoking status: Former Smoker    Quit date: 11/19/1987    Years since quitting: 31.8  . Smokeless tobacco: Never  Used  Substance Use Topics  . Alcohol use: No    Alcohol/week: 0.0 standard drinks    CODE STATUS:   Code Status: Prior (DNR) ADVANCED DIRECTIVES: Discussed - SW to bring copy. MOST FORM COMPLETE:  No. HOSPICE EDUCATION PROVIDED: None.  PPS: Patient is ambulating with his walker. Patient is able to feed himself, dress himself, and bathe himself with standby.  I spent 45 minutes with patient/family, from 11:15a-12:00p providing education, support and consultation.   Margaretmary Lombard, LCSW

## 2019-10-01 DIAGNOSIS — D649 Anemia, unspecified: Secondary | ICD-10-CM | POA: Diagnosis not present

## 2019-10-01 DIAGNOSIS — I480 Paroxysmal atrial fibrillation: Secondary | ICD-10-CM | POA: Diagnosis not present

## 2019-10-01 DIAGNOSIS — Z8701 Personal history of pneumonia (recurrent): Secondary | ICD-10-CM | POA: Diagnosis not present

## 2019-10-01 DIAGNOSIS — I69391 Dysphagia following cerebral infarction: Secondary | ICD-10-CM | POA: Diagnosis not present

## 2019-10-01 DIAGNOSIS — I4892 Unspecified atrial flutter: Secondary | ICD-10-CM | POA: Diagnosis not present

## 2019-10-01 DIAGNOSIS — G40909 Epilepsy, unspecified, not intractable, without status epilepticus: Secondary | ICD-10-CM | POA: Diagnosis not present

## 2019-10-01 DIAGNOSIS — R1310 Dysphagia, unspecified: Secondary | ICD-10-CM | POA: Diagnosis not present

## 2019-10-01 DIAGNOSIS — I739 Peripheral vascular disease, unspecified: Secondary | ICD-10-CM | POA: Diagnosis not present

## 2019-10-01 DIAGNOSIS — Z9181 History of falling: Secondary | ICD-10-CM | POA: Diagnosis not present

## 2019-10-01 DIAGNOSIS — J9691 Respiratory failure, unspecified with hypoxia: Secondary | ICD-10-CM | POA: Diagnosis not present

## 2019-10-01 DIAGNOSIS — M810 Age-related osteoporosis without current pathological fracture: Secondary | ICD-10-CM | POA: Diagnosis not present

## 2019-10-01 DIAGNOSIS — I10 Essential (primary) hypertension: Secondary | ICD-10-CM | POA: Diagnosis not present

## 2019-10-01 DIAGNOSIS — I441 Atrioventricular block, second degree: Secondary | ICD-10-CM | POA: Diagnosis not present

## 2019-10-01 DIAGNOSIS — Z993 Dependence on wheelchair: Secondary | ICD-10-CM | POA: Diagnosis not present

## 2019-10-01 DIAGNOSIS — Z7982 Long term (current) use of aspirin: Secondary | ICD-10-CM | POA: Diagnosis not present

## 2019-10-05 ENCOUNTER — Other Ambulatory Visit: Payer: Self-pay

## 2019-10-05 ENCOUNTER — Emergency Department (HOSPITAL_COMMUNITY)
Admission: EM | Admit: 2019-10-05 | Discharge: 2019-10-06 | Disposition: A | Discharge status: Home / Self Care | Payer: PPO | Attending: Emergency Medicine | Admitting: Emergency Medicine

## 2019-10-05 ENCOUNTER — Emergency Department (HOSPITAL_COMMUNITY): Payer: PPO

## 2019-10-05 DIAGNOSIS — T50901A Poisoning by unspecified drugs, medicaments and biological substances, accidental (unintentional), initial encounter: Secondary | ICD-10-CM | POA: Insufficient documentation

## 2019-10-05 DIAGNOSIS — I739 Peripheral vascular disease, unspecified: Secondary | ICD-10-CM | POA: Insufficient documentation

## 2019-10-05 DIAGNOSIS — I10 Essential (primary) hypertension: Secondary | ICD-10-CM | POA: Insufficient documentation

## 2019-10-05 DIAGNOSIS — Z87891 Personal history of nicotine dependence: Secondary | ICD-10-CM | POA: Diagnosis not present

## 2019-10-05 DIAGNOSIS — W19XXXA Unspecified fall, initial encounter: Secondary | ICD-10-CM | POA: Diagnosis not present

## 2019-10-05 DIAGNOSIS — R1111 Vomiting without nausea: Secondary | ICD-10-CM | POA: Diagnosis not present

## 2019-10-05 DIAGNOSIS — W1830XA Fall on same level, unspecified, initial encounter: Secondary | ICD-10-CM | POA: Insufficient documentation

## 2019-10-05 DIAGNOSIS — R05 Cough: Secondary | ICD-10-CM | POA: Diagnosis not present

## 2019-10-05 DIAGNOSIS — T421X1A Poisoning by iminostilbenes, accidental (unintentional), initial encounter: Secondary | ICD-10-CM | POA: Diagnosis not present

## 2019-10-05 DIAGNOSIS — Z7982 Long term (current) use of aspirin: Secondary | ICD-10-CM | POA: Insufficient documentation

## 2019-10-05 DIAGNOSIS — I4891 Unspecified atrial fibrillation: Secondary | ICD-10-CM | POA: Diagnosis not present

## 2019-10-05 DIAGNOSIS — J189 Pneumonia, unspecified organism: Secondary | ICD-10-CM | POA: Diagnosis not present

## 2019-10-05 DIAGNOSIS — Z79899 Other long term (current) drug therapy: Secondary | ICD-10-CM | POA: Diagnosis not present

## 2019-10-05 DIAGNOSIS — R42 Dizziness and giddiness: Secondary | ICD-10-CM | POA: Diagnosis not present

## 2019-10-05 LAB — BASIC METABOLIC PANEL
Anion gap: 10 (ref 5–15)
BUN: 14 mg/dL (ref 8–23)
CO2: 23 mmol/L (ref 22–32)
Calcium: 8.4 mg/dL — ABNORMAL LOW (ref 8.9–10.3)
Chloride: 103 mmol/L (ref 98–111)
Creatinine, Ser: 0.76 mg/dL (ref 0.61–1.24)
GFR calc Af Amer: >60 mL/min (ref >60)
GFR calc non Af Amer: >60 mL/min (ref >60)
Glucose, Bld: 112 mg/dL — ABNORMAL HIGH (ref 70–99)
Potassium: 3.9 mmol/L (ref 3.5–5.1)
Sodium: 136 mmol/L (ref 135–145)

## 2019-10-05 LAB — CBC
HCT: 35.8 % — ABNORMAL LOW (ref 39.0–52.0)
Hemoglobin: 11.2 g/dL — ABNORMAL LOW (ref 13.0–17.0)
MCH: 30 pg (ref 26.0–34.0)
MCHC: 31.3 g/dL (ref 30.0–36.0)
MCV: 96 fL (ref 80.0–100.0)
Platelets: 135 10*3/uL — ABNORMAL LOW (ref 150–400)
RBC: 3.73 MIL/uL — ABNORMAL LOW (ref 4.22–5.81)
RDW: 13.5 % (ref 11.5–15.5)
WBC: 6.8 10*3/uL (ref 4.0–10.5)
nRBC: 0 % (ref 0.0–0.2)

## 2019-10-05 NOTE — ED Provider Notes (Signed)
[facility] EMERGENCY DEPARTMENT Provider Note   CSN: [id number] Arrival date & time: 10/05/19  2210     History Chief Complaint  Patient presents with  . Dizziness    Richard Mitchell is a 79 y.o. 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  The patient reports that he has not been on 400 mg of carbamazepine twice daily for years.  He reports that at approximately 18:30 he doubled his night-time dose.  Reports that he began feeling dizzy shortly after taking the medication.  He reports that his wife spoke with the pharmacist and was advised that he can take meclizine to assist with dizziness.  He was given 1 tablet shortly after her dizziness began.  He reports that while he was feeling dizzy at home that he stood to try and walk to the bathroom and fell onto his right side.  Reports that he hit his head on the cushions of the couch.  No syncope.  No nausea or vomiting.  He was unable to stand after the fall.    He reports that since arriving in the ER the dizziness is now resolved.  He ambulates with a walker at baseline.  He denies headache, neck pain, back pain, chest pain, shortness of breath, abdominal pain, numbness, weakness, or other complaints associated with his fall or episodes of dizziness.  Patient was discharged from the hospital on February 1 after an admission for aspiration ammonia.  He does note that he has had a mild cough for the last few days.  No recent choking episodes.  He is a DNR.  The history is provided by the patient. No language interpreter was used.       Past Medical History:  Diagnosis Date  . Anemia   . Cancer ([place])    [place]  . Carotid artery occlusion   . Dysphasia S/P CVA (cerebrovascular accident)   . Gait disturbance 11/12/2012  . History of subdural hematoma (post traumatic)  Approx 1998  . Hypertension   . Keratosis 11/12/2012   x2  . Onychomycosis 11/12/2012   extreme neglect x 10  . Osteoporosis   . PAD (peripheral artery disease) ([place]) 11/12/2012   possible due to diminished pedal pulses  . Seizure disorder [facility])     Patient Active Problem List   Diagnosis Date Noted  . Mobitz type 1 second degree AV block 08/10/2019  . Paroxysmal A-fib ([place]) 08/10/2019  . Atrial flutter ([place]) 08/10/2019  . Palliative care by specialist   . DNR (do not resuscitate)   . Adult failure to thrive   . Dysphagia 08/05/2019  . Generalized weakness 08/05/2019  . Fall at home, initial encounter 08/05/2019  . Aspiration pneumonia ([place]) 08/04/2019  . Essential hypertension 01/21/2018  . Hyperlipidemia 01/21/2018  . Aftercare following surgery of the circulatory system, [place] 03/03/2014  . Occlusion and stenosis of carotid artery with cerebral infarction 03/03/2014  . PAD (peripheral artery disease) ([place])   . Onychomycosis   . Keratosis   . Occlusion and stenosis of carotid artery without mention of cerebral infarction 02/24/2013  . Gait disturbance 11/12/2012    Past Surgical History:  Procedure Laterality Date  . CAROTID ENDARTERECTOMY Left 10-13-2009  . SUBDURAL HEMATOMA EVACUATION VIA CRANIOTOMY         Family History  Problem Relation Age of Onset  . Heart disease  Mother        had pacemaker  . Cancer Mother        Breast  . Heart disease Sister   . Cancer Sister        Pancreatic  . Deep vein thrombosis Sister   . Heart attack Sister   . COPD Father     Social History   Tobacco Use  . Smoking status: Former Smoker    Quit date: 11/19/1987    Years since quitting: 31.9  . Smokeless tobacco: Never Used  Substance Use Topics  . Alcohol use: No    Alcohol/week: 0.0 standard drinks  . Drug use: No    Home Medications Prior to Admission medications   Medication Sig Start Date End Date Taking? Authorizing Provider  acetaminophen (TYLENOL) 500  MG tablet Take 500 mg by mouth in the morning and at bedtime.   Yes [provider]  aspirin 81 MG tablet Take 81 mg by mouth daily.   Yes [provider]  carbamazepine (TEGRETOL) 200 MG tablet Take 400 mg by mouth 2 (two) times daily.    Yes [provider]  Carboxymethylcellul-Glycerin (CLEAR EYES FOR DRY EYES OP) Place 1 drop into both eyes 2 (two) times daily.   Yes [provider]  rosuvastatin (CRESTOR) 20 MG tablet Take 20 mg by mouth daily.  10/20/16  Yes [provider]  Vitamin D, Ergocalciferol, (DRISDOL) 50000 UNITS CAPS Take 50,000 Units by mouth every 7 (seven) days.   Yes [provider]  doxycycline (VIBRAMYCIN) 100 MG capsule Take 1 capsule (100 mg total) by mouth 2 (two) times daily for 5 days. 10/06/19 10/11/19  Ruhama Lehew A, PA-C    Allergies    Patient has no known allergies.  Review of Systems   Review of Systems  Constitutional: Negative for appetite change, chills and fever.  HENT: Negative for sore throat.   Respiratory: Positive for cough. Negative for shortness of breath and wheezing.   Cardiovascular: Negative for chest pain, palpitations and leg swelling.  Gastrointestinal: Negative for abdominal pain, blood in stool, diarrhea, nausea and vomiting.  Genitourinary: Negative for dysuria.  Musculoskeletal: Negative for arthralgias, back pain, gait problem, joint swelling, myalgias, neck pain and neck stiffness.  Skin: Negative for rash.  Allergic/Immunologic: Negative for immunocompromised state.  Neurological: Positive for dizziness. Negative for seizures, syncope, weakness, light-headedness, numbness and headaches.  Psychiatric/Behavioral: Negative for confusion, self-injury and suicidal ideas.    Physical Exam Updated Vital Signs BP (!) 154/69   Pulse (!) 54   Temp 97.9 F (36.6 C) (Oral)   Resp 20   Ht 6\' 2"  (1.88 m)   Wt 68 kg   SpO2 96%   BMI 19.26 kg/m   Physical Exam Vitals and nursing  note reviewed.  Constitutional:      Appearance: He is well-developed.     Comments: Cachectic, chronically ill-appearing elderly male.  Frail.  HENT:     Head:     Comments: Skull indention on the right temporal area, likely from patient's history of prior craniotomy.  No evidence of acute trauma to the scalp. Eyes:     Extraocular Movements: Extraocular movements intact.     Conjunctiva/sclera: Conjunctivae normal.     Pupils: Pupils are equal, round, and reactive to light.  Neck:     Comments: Full range of motion of the neck. Cardiovascular:     Rate and Rhythm: Normal rate and regular rhythm.     Heart sounds: No  murmur.  Pulmonary:     Effort: Pulmonary effort is normal.     Comments: Faint crackles in the bilateral bases, left greater than right.  No wheezes.  Able to speak in complete, fluent sentences without increased work of breathing.  Chest wall is nontender. Chest:     Chest wall: No tenderness.  Abdominal:     General: There is no distension.     Palpations: Abdomen is soft. There is no mass.     Tenderness: There is no abdominal tenderness. There is no right CVA tenderness, left CVA tenderness, guarding or rebound.     Hernia: No hernia is present.     Comments: Abdomen is soft nontender.  Musculoskeletal:     Cervical back: Neck supple.     Right lower leg: No edema.     Left lower leg: No edema.     Comments: Spine is nontender.  Pelvis is nontender.  No tenderness noted to the bilateral upper or lower extremities.  Skin:    General: Skin is warm and dry.  Neurological:     Mental Status: He is alert.     Comments: Alert and oriented x4.  Cranial nerves II through XII are grossly intact.  5/5 strength against resistance of the bilateral upper and lower extremities.  No pronator drift.  Sensation is intact and equal throughout.  Unable to assess gait at this time.  Psychiatric:        Behavior: Behavior normal.     ED Results / Procedures / Treatments     Labs (all labs ordered are listed, but only abnormal results are displayed) Labs Reviewed  URINALYSIS, ROUTINE W REFLEX MICROSCOPIC - Abnormal; Notable for the following components:      Result Value   Color, Urine AMBER (*)    All other components within normal limits  CBC - Abnormal; Notable for the following components:   RBC 3.73 (*)    Hemoglobin 11.2 (*)    HCT 35.8 (*)    Platelets 135 (*)    All other components within normal limits  BASIC METABOLIC PANEL - Abnormal; Notable for the following components:   Glucose, Bld 112 (*)    Calcium 8.4 (*)    All other components within normal limits    EKG None  Radiology DG Chest 2 View  Result Date: 10/05/2019 CLINICAL DATA:  Cough EXAM: CHEST - 2 VIEW COMPARISON:  08/04/2019, 10/12/2009 FINDINGS: Emphysematous disease. Persistent airspace disease at the right base. Increased airspace disease at the left lung base. No pleural effusion. Stable cardiomediastinal silhouette with aortic atherosclerosis. No pneumothorax. IMPRESSION: Emphysematous disease. Left greater than right basilar airspace disease suspect for pneumonia. Radiographic follow-up to clearing is recommended Electronically Signed   By: Donavan Foil M.D.   On: 10/05/2019 23:10    Procedures Procedures (including critical care time)  Medications Ordered in ED Medications  acetaminophen (TYLENOL) tablet 650 mg (650 mg Oral Given 10/06/19 0229)  doxycycline (VIBRA-TABS) tablet 100 mg (100 mg Oral Given 10/06/19 0229)    ED Course  I have reviewed the triage vital signs and the nursing notes.  Pertinent labs & imaging results that were available during my care of the patient were reviewed by me and considered in my medical decision making (see chart for details).    MDM Rules/Calculators/A&P                      79 year old male with history of posttraumatic subdural hematoma  s/p post evacuation via craniotomy, failure to thrive presenting with dizziness that  began shortly after he mistakenly took 800 mg of his home carbamazepine.  He typically takes 400 mg twice daily.  No SI or HI.  He was given a dose of meclizine by his wife after onset of dizziness.  Symptoms have now resolved.  However, while he was feeling dizzy at home he had a fall onto his right side and hit his head.  The patient was seen and independently evaluated by Dr. Wilson Singer, attending physician.  Patient adamantly declined CT head and cervical spine despite falling and hitting his head.  Risks and benefits were discussed with the patient at bedside, but patient appears to be competent and continues to decline testing.  Head is atraumatic and he has no pain over the cervical spine.  I have a low suspicion for intracranial bleed as he is not on anticoagulation and has no focal neurologic deficits at this time.  Patient has no complaints at this time.  He has noted to be coughing on exam.  Crackles are auscultated in the bilateral bases, left greater than right.  There is concern for a developing pneumonia in the left basilar space.  Will start on treatment with doxycycline and have him follow-up with primary care to ensure this has resolved.  No recent choking episodes to suspect aspiration ammonia.  No other infectious symptoms.  Regarding his dizziness, has resolved at this time.  Although he took 800 mg, this is still less than the 20 mg/kg dosing that is concerning for toxicity after speaking with [employer].  They recommended observation for 1 to 12 hours and to look for dizziness or drowsiness.  They recommended observation can be discontinued when the patient was back at baseline.  Since patient is back to baseline at this time, they felt he no longer needed to be observed.   Labs are otherwise reassuring, but he does have a new thrombocytopenia of unknown significance.  No intervention indicated at this time.  No evidence of UA.  Following work-up, patient was ambulated  with a walker in the ER and had no complaints.  He appeared steady on his feet.  He is hemodynamically stable and in no acute distress.  He does have palliative care that recently completed a home visit.  At this time, I do not feel that any further urgent or emergent work-up is indicated.  Patient is safe for discharge to home with outpatient follow-up as indicated.  Final Clinical Impression(s) / ED Diagnoses Final diagnoses:  Medication overdose, accidental or unintentional, initial encounter  Fall from standing, initial encounter  Community acquired pneumonia of left lower lobe of lung    Rx / DC Orders ED Discharge Orders         Ordered    doxycycline (VIBRAMYCIN) 100 MG capsule  2 times daily     10/06/19 0216           Tineshia Becraft, Maree Erie A, PA-C 10/06/19 0303    Virgel Manifold, MD 10/07/19 1825

## 2019-10-05 NOTE — ED Notes (Signed)
House [phone] pts wife Amedeo Plenty; call both numbers when pt is up for discharge so that the wife can open the door for ptar

## 2019-10-05 NOTE — ED Triage Notes (Signed)
Pt presents from home via EMS after self administering PM dose tegretol 2x on accident, began feeling dizzy after this. Called pharmacist and was told he could take meclizine if prescribed to him, wife gave this med but was prescribed to her. Later, fell back onto couch and then guided to floor d/t this dizziness. No injuries, no head injury, no blood thinners, no LOC. Denies SOB, CP. NO longer dizzy upon arrival. Emesisx1 en route, now denies nausea.   EMS exam: VAN neg, 180/90, cbg 123  H/o seizures, recent admission for aspiration pnu  Wife [name] - [phone]

## 2019-10-05 NOTE — ED Notes (Signed)
Called pt wife, would like to make sure pt can walk prior to returning home, unable to drive in dark to pick up, pt will need to return home via PTAR per wife. No other concerns voiced.

## 2019-10-06 DIAGNOSIS — Z743 Need for continuous supervision: Secondary | ICD-10-CM | POA: Diagnosis not present

## 2019-10-06 DIAGNOSIS — T887XXA Unspecified adverse effect of drug or medicament, initial encounter: Secondary | ICD-10-CM | POA: Diagnosis not present

## 2019-10-06 DIAGNOSIS — R5381 Other malaise: Secondary | ICD-10-CM | POA: Diagnosis not present

## 2019-10-06 DIAGNOSIS — T50904A Poisoning by unspecified drugs, medicaments and biological substances, undetermined, initial encounter: Secondary | ICD-10-CM | POA: Diagnosis not present

## 2019-10-06 DIAGNOSIS — R279 Unspecified lack of coordination: Secondary | ICD-10-CM | POA: Diagnosis not present

## 2019-10-06 LAB — URINALYSIS, ROUTINE W REFLEX MICROSCOPIC
Bilirubin Urine: NEGATIVE
Glucose, UA: NEGATIVE mg/dL
Hgb urine dipstick: NEGATIVE
Ketones, ur: NEGATIVE mg/dL
Leukocytes,Ua: NEGATIVE
Nitrite: NEGATIVE
Protein, ur: NEGATIVE mg/dL
Specific Gravity, Urine: 1.026 (ref 1.005–1.030)
pH: 6 (ref 5.0–8.0)

## 2019-10-06 MED ORDER — DOXYCYCLINE HYCLATE 100 MG PO CAPS: 100.0000 mg | ORAL_CAPSULE | Freq: Two times a day (BID) | ORAL | 0 refills | Status: AC

## 2019-10-06 MED ORDER — DOXYCYCLINE HYCLATE 100 MG PO TABS
100.0000 mg | ORAL_TABLET | Freq: Once | ORAL | Status: AC
  Administered 2019-10-06: 02:00:00 100 mg via ORAL
  Filled 2019-10-06: qty 1

## 2019-10-06 MED ORDER — ACETAMINOPHEN 325 MG PO TABS
650.0000 mg | ORAL_TABLET | Freq: Once | ORAL | Status: AC
  Administered 2019-10-06: 650 mg via ORAL
  Filled 2019-10-06: qty 2

## 2019-10-06 NOTE — ED Notes (Signed)
Ptar called for pt 

## 2019-10-06 NOTE — ED Notes (Signed)
Pt. Successfully Ambulated. Tolerated very well, no signs of unsteadiness.

## 2019-10-06 NOTE — ED Notes (Signed)
Patient verbalizes understanding of discharge instructions. Opportunity for questioning and answers were provided. Armband removed by staff, pt discharged from ED. Transported out by [employer]

## 2019-10-06 NOTE — Discharge Instructions (Addendum)
Thank you for allowing me to care for you today in the Emergency Department.   You were seen today for dizziness and a fall.  This is likely due to taking a double dose of your nighttime Tegretol.  Your symptoms resolved while you are in the ER.  Your chest x-ray showed that you have a pneumonia in the left lower lung.  You were given the first dose of doxycycline, and antibiotic, while you are in the ER tonight.  Starting tomorrow morning, take doxycycline 2 times daily for the next 5 days.  Please follow-up with your primary care provider in the next 1 to 2 weeks to make sure that this is resolved.  They may want to repeat a chest x-ray at that time.  You can start taking your home dose of Tegretol in the morning.  Return to the emergency department if you have another fall or injury, if you develop respiratory distress, if you pass out, if you develop dizziness that is persistent, or other new, concerning symptoms.

## 2019-10-08 ENCOUNTER — Telehealth: Payer: Self-pay

## 2019-10-08 DIAGNOSIS — Z993 Dependence on wheelchair: Secondary | ICD-10-CM | POA: Diagnosis not present

## 2019-10-08 DIAGNOSIS — Z9181 History of falling: Secondary | ICD-10-CM | POA: Diagnosis not present

## 2019-10-08 DIAGNOSIS — J9691 Respiratory failure, unspecified with hypoxia: Secondary | ICD-10-CM | POA: Diagnosis not present

## 2019-10-08 DIAGNOSIS — D649 Anemia, unspecified: Secondary | ICD-10-CM | POA: Diagnosis not present

## 2019-10-08 DIAGNOSIS — I441 Atrioventricular block, second degree: Secondary | ICD-10-CM | POA: Diagnosis not present

## 2019-10-08 DIAGNOSIS — I69391 Dysphagia following cerebral infarction: Secondary | ICD-10-CM | POA: Diagnosis not present

## 2019-10-08 DIAGNOSIS — I10 Essential (primary) hypertension: Secondary | ICD-10-CM | POA: Diagnosis not present

## 2019-10-08 DIAGNOSIS — I739 Peripheral vascular disease, unspecified: Secondary | ICD-10-CM | POA: Diagnosis not present

## 2019-10-08 DIAGNOSIS — I480 Paroxysmal atrial fibrillation: Secondary | ICD-10-CM | POA: Diagnosis not present

## 2019-10-08 DIAGNOSIS — M810 Age-related osteoporosis without current pathological fracture: Secondary | ICD-10-CM | POA: Diagnosis not present

## 2019-10-08 DIAGNOSIS — R1310 Dysphagia, unspecified: Secondary | ICD-10-CM | POA: Diagnosis not present

## 2019-10-08 DIAGNOSIS — Z7982 Long term (current) use of aspirin: Secondary | ICD-10-CM | POA: Diagnosis not present

## 2019-10-08 DIAGNOSIS — Z8701 Personal history of pneumonia (recurrent): Secondary | ICD-10-CM | POA: Diagnosis not present

## 2019-10-08 DIAGNOSIS — I4892 Unspecified atrial flutter: Secondary | ICD-10-CM | POA: Diagnosis not present

## 2019-10-08 DIAGNOSIS — G40909 Epilepsy, unspecified, not intractable, without status epilepticus: Secondary | ICD-10-CM | POA: Diagnosis not present

## 2019-10-08 NOTE — Telephone Encounter (Signed)
Telephone call to patient to schedulepalliative carevisit with patient. Patient/familyin agreement withhomevisit on4-13-21 at 1:30PM.

## 2019-10-16 DIAGNOSIS — J9691 Respiratory failure, unspecified with hypoxia: Secondary | ICD-10-CM | POA: Diagnosis not present

## 2019-10-16 DIAGNOSIS — I441 Atrioventricular block, second degree: Secondary | ICD-10-CM | POA: Diagnosis not present

## 2019-10-16 DIAGNOSIS — M810 Age-related osteoporosis without current pathological fracture: Secondary | ICD-10-CM | POA: Diagnosis not present

## 2019-10-16 DIAGNOSIS — G40909 Epilepsy, unspecified, not intractable, without status epilepticus: Secondary | ICD-10-CM | POA: Diagnosis not present

## 2019-10-16 DIAGNOSIS — Z7982 Long term (current) use of aspirin: Secondary | ICD-10-CM | POA: Diagnosis not present

## 2019-10-16 DIAGNOSIS — I10 Essential (primary) hypertension: Secondary | ICD-10-CM | POA: Diagnosis not present

## 2019-10-16 DIAGNOSIS — Z8701 Personal history of pneumonia (recurrent): Secondary | ICD-10-CM | POA: Diagnosis not present

## 2019-10-16 DIAGNOSIS — I739 Peripheral vascular disease, unspecified: Secondary | ICD-10-CM | POA: Diagnosis not present

## 2019-10-16 DIAGNOSIS — D649 Anemia, unspecified: Secondary | ICD-10-CM | POA: Diagnosis not present

## 2019-10-16 DIAGNOSIS — I4892 Unspecified atrial flutter: Secondary | ICD-10-CM | POA: Diagnosis not present

## 2019-10-16 DIAGNOSIS — I69391 Dysphagia following cerebral infarction: Secondary | ICD-10-CM | POA: Diagnosis not present

## 2019-10-16 DIAGNOSIS — R1310 Dysphagia, unspecified: Secondary | ICD-10-CM | POA: Diagnosis not present

## 2019-10-16 DIAGNOSIS — I480 Paroxysmal atrial fibrillation: Secondary | ICD-10-CM | POA: Diagnosis not present

## 2019-10-16 DIAGNOSIS — Z993 Dependence on wheelchair: Secondary | ICD-10-CM | POA: Diagnosis not present

## 2019-10-16 DIAGNOSIS — Z9181 History of falling: Secondary | ICD-10-CM | POA: Diagnosis not present

## 2019-10-19 DIAGNOSIS — M810 Age-related osteoporosis without current pathological fracture: Secondary | ICD-10-CM | POA: Diagnosis not present

## 2019-10-19 DIAGNOSIS — E7849 Other hyperlipidemia: Secondary | ICD-10-CM | POA: Diagnosis not present

## 2019-10-19 DIAGNOSIS — R569 Unspecified convulsions: Secondary | ICD-10-CM | POA: Diagnosis not present

## 2019-10-20 ENCOUNTER — Other Ambulatory Visit: Payer: PPO | Admitting: Hospice

## 2019-10-20 ENCOUNTER — Other Ambulatory Visit: Payer: Self-pay

## 2019-10-20 ENCOUNTER — Other Ambulatory Visit: Payer: PPO

## 2019-10-20 DIAGNOSIS — G40909 Epilepsy, unspecified, not intractable, without status epilepticus: Secondary | ICD-10-CM | POA: Diagnosis not present

## 2019-10-20 DIAGNOSIS — D649 Anemia, unspecified: Secondary | ICD-10-CM | POA: Diagnosis not present

## 2019-10-20 DIAGNOSIS — Z515 Encounter for palliative care: Secondary | ICD-10-CM

## 2019-10-20 DIAGNOSIS — I69391 Dysphagia following cerebral infarction: Secondary | ICD-10-CM | POA: Diagnosis not present

## 2019-10-20 DIAGNOSIS — Z9181 History of falling: Secondary | ICD-10-CM | POA: Diagnosis not present

## 2019-10-20 DIAGNOSIS — Z993 Dependence on wheelchair: Secondary | ICD-10-CM | POA: Diagnosis not present

## 2019-10-20 DIAGNOSIS — I441 Atrioventricular block, second degree: Secondary | ICD-10-CM | POA: Diagnosis not present

## 2019-10-20 DIAGNOSIS — I739 Peripheral vascular disease, unspecified: Secondary | ICD-10-CM | POA: Diagnosis not present

## 2019-10-20 DIAGNOSIS — Z7982 Long term (current) use of aspirin: Secondary | ICD-10-CM | POA: Diagnosis not present

## 2019-10-20 DIAGNOSIS — I4892 Unspecified atrial flutter: Secondary | ICD-10-CM | POA: Diagnosis not present

## 2019-10-20 DIAGNOSIS — J9691 Respiratory failure, unspecified with hypoxia: Secondary | ICD-10-CM | POA: Diagnosis not present

## 2019-10-20 DIAGNOSIS — Z8701 Personal history of pneumonia (recurrent): Secondary | ICD-10-CM | POA: Diagnosis not present

## 2019-10-20 DIAGNOSIS — I480 Paroxysmal atrial fibrillation: Secondary | ICD-10-CM | POA: Diagnosis not present

## 2019-10-20 DIAGNOSIS — R1310 Dysphagia, unspecified: Secondary | ICD-10-CM | POA: Diagnosis not present

## 2019-10-20 DIAGNOSIS — M810 Age-related osteoporosis without current pathological fracture: Secondary | ICD-10-CM | POA: Diagnosis not present

## 2019-10-20 DIAGNOSIS — I10 Essential (primary) hypertension: Secondary | ICD-10-CM | POA: Diagnosis not present

## 2019-10-20 NOTE — Progress Notes (Signed)
PATIENT NAME: Richard Mitchell DOB: September 22, 1940 MRN: [id number]  PRIMARY CARE PROVIDER: Marton Redwood, MD  RESPONSIBLE PARTY:  Acct ID - Guarantor Home Phone Work Phone Relationship Acct Type  [id number] - Nosal,[phone]  Self P/F     [address], [name], [place] [address]    PLAN OF CARE and INTERVENTIONS:               1.  GOALS OF CARE/ ADVANCE CARE PLANNING:  Remain at home with wife and be comfortable.               2.  PATIENT/CAREGIVER EDUCATION:  Education on fall precautions, education on s/s of infection, reviewed meds, support               3.  DISEASE STATUS: SW and RN made scheduled palliative care visit. Palliative care team met with patient and patients wife [name]. Patient sitting in recliner chair alert to self and familiars. Patient able to tell palliative care team where he worked and job he worked at [employer]. Patient currently rates pain at 7.5 on pain scale. Patient reports pain is in his sacral area and left wrist. Patient suffered a fall 2 weeks ago and has bruising to sacral area. Nurse made recommendation for patient to try Ibuprofen.  Nurse also made recommendaiton for wrist immobilizer to hopefully help with pain. Patients current weight is 157 pounds and patient is 6'2.  Wife reports patients weight a few years ago was 175 lbs. Wife reports patient is  coughing with eating and drinking. Wife reports patient is to see Dr. Brigitte Pulse on Monday. Wife states she will talk to Dr. Brigitte Pulse about swallowing study and patient having rattling at times. Patient has had pneumonia in the past that may have been due to aspiration. Patient is on a pureed diet. Patient's breath sounds are clear when RN assessed patients breath sounds.  Patient's heart rate 50 at visit today. Wife reports patient has a low heart rate. Physical therapist arrived at end of visit and wife states she feels will be the last visit by physical therapy. SW placed call to [facility] to follow up on speech  referral for patient.  Patient eats 2 meals per day most of the time per wife. Patient has trace edema in his lower extremities. Patient reports he has been sleeping fairly well. Patient and wife remain in agreement with palliative care services. Patient is a DNR. Nurse reviewed patient's medications with wife. Patient and wife encouraged to contact palliative care with questions or concerns.     HISTORY OF PRESENT ILLNESS:  Patient is a 79 year old male who resides in home with wife [name].  Patient is followed by palliative care and is seen monthly and PRN.  CODE STATUS: DNR  ADVANCED DIRECTIVES: No MOST FORM: No PPS: 50%   PHYSICAL EXAM:   VITALS: See vital signs  LUNGS: breath sounds clear, coughing with eating and drinking CARDIAC: bradycardia, regular heart rate EXTREMITIES: trace edema to LE's SKIN: Bruising to sacral area from fall 2 weeks ago NEURO: Alert and oriented x 3       Nilda Simmer, RN

## 2019-10-20 NOTE — Progress Notes (Signed)
COMMUNITY PALLIATIVE CARE SW NOTE  PATIENT NAME: Richard Mitchell DOB: [birth year] MRN: [id number]  PRIMARY CARE PROVIDER: Marton Redwood, MD  RESPONSIBLE PARTY:  Acct ID - Guarantor Home Phone Work Phone Relationship Acct Type  [id number] - Seehafer,[phone]  Self P/F     [address], [place], [place] [address]     PLAN OF CARE and INTERVENTIONS:             1. GOALS OF CARE/ ADVANCE CARE PLANNING:  Patient is a DNR. HCPOA/Living Will discussed, SW to bring copy. Goal is to "keep living" and gain more strength in his legs.  2. SOCIAL/EMOTIONAL/SPIRITUAL ASSESSMENT/ INTERVENTIONS:  SW and RN met with patient and Richard Mitchell (patient's wife) in the home. Discussed patient's recent ED visit. Patient did have a fall from accidentally taking his medications on the wrong day/time and caused him to be dizzy. Richard Mitchell said she called EMS and they transported to evaluate in ED. Patient reports pain in his left wrist, RN assessed. RN suggested talking with PT about a brace. Patient eats about two meals a day, continues to eat pureed food. Patient is sleeping well, and naps during the day. SW provided education on advance care planning, reviewed care and goals, and used active and reflective listening.  3. PATIENT/CAREGIVER EDUCATION/ COPING:  Patient is alert, oriented. Patient is forgetful. Denied any coping concerns. Richard Mitchell is very supportive of care. 4. PERSONAL EMERGENCY PLAN:  Family will call 9-1-1 for emergencies. 5. COMMUNITY RESOURCES COORDINATION/ HEALTH CARE NAVIGATION:  Richard Mitchell manages patient care. Patient has PCP visit scheduled for . Patient is receiving home health PT with Richard Mitchell, Richard Mitchell noted that she thinks they will discharge soon. SW contacted [facility] to inquire about Speech Therapy, left VM and requested follow-up. SW provided contact for veteran services for [facility].  6. FINANCIAL/LEGAL CONCERNS/INTERVENTIONS:  None.      SOCIAL HX:  Social History   Tobacco Use  .  Smoking status: Former Smoker    Quit date: 11/19/1987    Years since quitting: 31.9  . Smokeless tobacco: Never Used  Substance Use Topics  . Alcohol use: No    Alcohol/week: 0.0 standard drinks    CODE STATUS: DNR ADVANCED DIRECTIVES: Provided copy and discussed. MOST FORM COMPLETE:  Yes.  HOSPICE EDUCATION PROVIDED: None.  PPS: Patient is ambulating with his walker. Patient is able to feed himself, dress himself, and bathe himself with standby.   I spent82mnutes with patient/family, from12:30-1:15pproviding education, support and consultation.  WMargaretmary Lombard LCSW

## 2019-10-20 NOTE — Progress Notes (Signed)
    [place] Consult Note Telephone: [phone]  Fax: [phone]  PATIENT NAME: Richard Mitchell DOB: 16-Dec-1940 MRN: [id number]  PRIMARY CARE PROVIDER:   Marton Redwood, MD  REFERRING PROVIDER:  Marton Redwood, MD [place],  [place] [address]  RESPONSIBLE PARTY:   Self Contact - [place] [id number] [place] VISIT STATEMENT Due to the COVID-19 crisis, this visit was done via telephone from my office. It was initiated and consented to by this patient and/or family.  RECOMMENDATIONS/PLAN:   Advance Care Planning/Goals of Care: Telehealth Visit at the request of patient and [place] for discussion and signing of DNR form. Patient reported he is a DNR. Spouse affirmed patient is a DNR; both understanding that no chest compressions will be done if patient should stop breathing and his heart stops to beat - no pulse. Goals of care include to maximize quality of life and symptom management. Whitney left signed DNR form patient and uploaded same in [place].  Follow up: Palliative care will continue to follow patient for goals of care clarification and symptom management. I spent 25 minutes providing this consultation; time includes chart review and documentation. More than 50% of the time in this consultation was spent on coordinating communication  CODE STATUS: DNR   HOSPICE ELIGIBILITY/DIAGNOSIS: TBD  PAST MEDICAL HISTORY:  Past Medical History:  Diagnosis Date  . Anemia   . Cancer ([place])    [place]  . Carotid artery occlusion   . Dysphasia S/P CVA (cerebrovascular accident)   . Gait disturbance 11/12/2012  . History of subdural hematoma (post traumatic) Approx 1998  . Hypertension   . Keratosis 11/12/2012   x2  . Onychomycosis 11/12/2012   extreme neglect x 10  . Osteoporosis   . PAD (peripheral artery disease) ([place]) 11/12/2012   possible due to diminished pedal pulses  . Seizure disorder [facility])       SOCIAL HX:  Social History   Tobacco Use  . Smoking status: Former Smoker    Quit date: 11/19/1987    Years since quitting: 31.9  . Smokeless tobacco: Never Used  Substance Use Topics  . Alcohol use: No    Alcohol/week: 0.0 standard drinks    ALLERGIES: No Known Allergies   PERTINENT MEDICATIONS:  Outpatient Encounter Medications as of 10/20/2019  Medication Sig  . acetaminophen (TYLENOL) 500 MG tablet Take 500 mg by mouth in the morning and at bedtime.  Marland Kitchen aspirin 81 MG tablet Take 81 mg by mouth daily.  . carbamazepine (TEGRETOL) 200 MG tablet Take 400 mg by mouth 2 (two) times daily.   . Carboxymethylcellul-Glycerin (CLEAR EYES FOR DRY EYES OP) Place 1 drop into both eyes 2 (two) times daily.  . rosuvastatin (CRESTOR) 20 MG tablet Take 20 mg by mouth daily.   . Vitamin D, Ergocalciferol, (DRISDOL) 50000 UNITS CAPS Take 50,000 Units by mouth every 7 (seven) days.   No facility-administered encounter medications on file as of 10/20/2019.    Teodoro Spray, NP

## 2019-10-26 DIAGNOSIS — I480 Paroxysmal atrial fibrillation: Secondary | ICD-10-CM | POA: Diagnosis not present

## 2019-10-26 DIAGNOSIS — I739 Peripheral vascular disease, unspecified: Secondary | ICD-10-CM | POA: Diagnosis not present

## 2019-10-26 DIAGNOSIS — J690 Pneumonitis due to inhalation of food and vomit: Secondary | ICD-10-CM | POA: Diagnosis not present

## 2019-10-26 DIAGNOSIS — M810 Age-related osteoporosis without current pathological fracture: Secondary | ICD-10-CM | POA: Diagnosis not present

## 2019-10-26 DIAGNOSIS — I6529 Occlusion and stenosis of unspecified carotid artery: Secondary | ICD-10-CM | POA: Diagnosis not present

## 2019-10-26 DIAGNOSIS — E559 Vitamin D deficiency, unspecified: Secondary | ICD-10-CM | POA: Diagnosis not present

## 2019-10-26 DIAGNOSIS — I69954 Hemiplegia and hemiparesis following unspecified cerebrovascular disease affecting left non-dominant side: Secondary | ICD-10-CM | POA: Diagnosis not present

## 2019-10-26 DIAGNOSIS — M25532 Pain in left wrist: Secondary | ICD-10-CM | POA: Diagnosis not present

## 2019-10-26 DIAGNOSIS — Z1331 Encounter for screening for depression: Secondary | ICD-10-CM | POA: Diagnosis not present

## 2019-10-26 DIAGNOSIS — R32 Unspecified urinary incontinence: Secondary | ICD-10-CM | POA: Diagnosis not present

## 2019-10-26 DIAGNOSIS — E785 Hyperlipidemia, unspecified: Secondary | ICD-10-CM | POA: Diagnosis not present

## 2019-10-26 DIAGNOSIS — I10 Essential (primary) hypertension: Secondary | ICD-10-CM | POA: Diagnosis not present

## 2019-10-26 DIAGNOSIS — Z0000 Encounter for general adult medical examination without abnormal findings: Secondary | ICD-10-CM | POA: Diagnosis not present

## 2019-11-05 DIAGNOSIS — I480 Paroxysmal atrial fibrillation: Secondary | ICD-10-CM | POA: Diagnosis not present

## 2019-11-05 DIAGNOSIS — I739 Peripheral vascular disease, unspecified: Secondary | ICD-10-CM | POA: Diagnosis not present

## 2019-11-05 DIAGNOSIS — Z9181 History of falling: Secondary | ICD-10-CM | POA: Diagnosis not present

## 2019-11-05 DIAGNOSIS — I10 Essential (primary) hypertension: Secondary | ICD-10-CM | POA: Diagnosis not present

## 2019-11-05 DIAGNOSIS — D649 Anemia, unspecified: Secondary | ICD-10-CM | POA: Diagnosis not present

## 2019-11-05 DIAGNOSIS — E785 Hyperlipidemia, unspecified: Secondary | ICD-10-CM | POA: Diagnosis not present

## 2019-11-05 DIAGNOSIS — I4892 Unspecified atrial flutter: Secondary | ICD-10-CM | POA: Diagnosis not present

## 2019-11-05 DIAGNOSIS — I441 Atrioventricular block, second degree: Secondary | ICD-10-CM | POA: Diagnosis not present

## 2019-11-05 DIAGNOSIS — G40909 Epilepsy, unspecified, not intractable, without status epilepticus: Secondary | ICD-10-CM | POA: Diagnosis not present

## 2019-11-05 DIAGNOSIS — R627 Adult failure to thrive: Secondary | ICD-10-CM | POA: Diagnosis not present

## 2019-11-05 DIAGNOSIS — Z8701 Personal history of pneumonia (recurrent): Secondary | ICD-10-CM | POA: Diagnosis not present

## 2019-11-05 DIAGNOSIS — Z87891 Personal history of nicotine dependence: Secondary | ICD-10-CM | POA: Diagnosis not present

## 2019-11-05 DIAGNOSIS — R1310 Dysphagia, unspecified: Secondary | ICD-10-CM | POA: Diagnosis not present

## 2019-11-05 DIAGNOSIS — M810 Age-related osteoporosis without current pathological fracture: Secondary | ICD-10-CM | POA: Diagnosis not present

## 2019-11-05 DIAGNOSIS — I69391 Dysphagia following cerebral infarction: Secondary | ICD-10-CM | POA: Diagnosis not present

## 2019-11-05 DIAGNOSIS — Z7982 Long term (current) use of aspirin: Secondary | ICD-10-CM | POA: Diagnosis not present

## 2019-11-10 ENCOUNTER — Telehealth: Payer: Self-pay

## 2019-11-10 NOTE — Telephone Encounter (Signed)
Telephone call to schedule palliative care visit.  Patients wife [name] in agreement with RN making home visit 11/17/19.

## 2019-11-12 DIAGNOSIS — Z7982 Long term (current) use of aspirin: Secondary | ICD-10-CM | POA: Diagnosis not present

## 2019-11-12 DIAGNOSIS — I480 Paroxysmal atrial fibrillation: Secondary | ICD-10-CM | POA: Diagnosis not present

## 2019-11-12 DIAGNOSIS — Z8701 Personal history of pneumonia (recurrent): Secondary | ICD-10-CM | POA: Diagnosis not present

## 2019-11-12 DIAGNOSIS — D649 Anemia, unspecified: Secondary | ICD-10-CM | POA: Diagnosis not present

## 2019-11-12 DIAGNOSIS — R627 Adult failure to thrive: Secondary | ICD-10-CM | POA: Diagnosis not present

## 2019-11-12 DIAGNOSIS — I4892 Unspecified atrial flutter: Secondary | ICD-10-CM | POA: Diagnosis not present

## 2019-11-12 DIAGNOSIS — Z9181 History of falling: Secondary | ICD-10-CM | POA: Diagnosis not present

## 2019-11-12 DIAGNOSIS — I739 Peripheral vascular disease, unspecified: Secondary | ICD-10-CM | POA: Diagnosis not present

## 2019-11-12 DIAGNOSIS — M810 Age-related osteoporosis without current pathological fracture: Secondary | ICD-10-CM | POA: Diagnosis not present

## 2019-11-12 DIAGNOSIS — I69391 Dysphagia following cerebral infarction: Secondary | ICD-10-CM | POA: Diagnosis not present

## 2019-11-12 DIAGNOSIS — Z87891 Personal history of nicotine dependence: Secondary | ICD-10-CM | POA: Diagnosis not present

## 2019-11-12 DIAGNOSIS — G40909 Epilepsy, unspecified, not intractable, without status epilepticus: Secondary | ICD-10-CM | POA: Diagnosis not present

## 2019-11-12 DIAGNOSIS — E785 Hyperlipidemia, unspecified: Secondary | ICD-10-CM | POA: Diagnosis not present

## 2019-11-12 DIAGNOSIS — I10 Essential (primary) hypertension: Secondary | ICD-10-CM | POA: Diagnosis not present

## 2019-11-12 DIAGNOSIS — R1310 Dysphagia, unspecified: Secondary | ICD-10-CM | POA: Diagnosis not present

## 2019-11-12 DIAGNOSIS — I441 Atrioventricular block, second degree: Secondary | ICD-10-CM | POA: Diagnosis not present

## 2019-11-13 DIAGNOSIS — R627 Adult failure to thrive: Secondary | ICD-10-CM | POA: Diagnosis not present

## 2019-11-13 DIAGNOSIS — I4892 Unspecified atrial flutter: Secondary | ICD-10-CM | POA: Diagnosis not present

## 2019-11-13 DIAGNOSIS — Z9181 History of falling: Secondary | ICD-10-CM | POA: Diagnosis not present

## 2019-11-13 DIAGNOSIS — D649 Anemia, unspecified: Secondary | ICD-10-CM | POA: Diagnosis not present

## 2019-11-13 DIAGNOSIS — I69391 Dysphagia following cerebral infarction: Secondary | ICD-10-CM | POA: Diagnosis not present

## 2019-11-13 DIAGNOSIS — I739 Peripheral vascular disease, unspecified: Secondary | ICD-10-CM | POA: Diagnosis not present

## 2019-11-13 DIAGNOSIS — G40909 Epilepsy, unspecified, not intractable, without status epilepticus: Secondary | ICD-10-CM | POA: Diagnosis not present

## 2019-11-13 DIAGNOSIS — I10 Essential (primary) hypertension: Secondary | ICD-10-CM | POA: Diagnosis not present

## 2019-11-13 DIAGNOSIS — I441 Atrioventricular block, second degree: Secondary | ICD-10-CM | POA: Diagnosis not present

## 2019-11-13 DIAGNOSIS — Z7982 Long term (current) use of aspirin: Secondary | ICD-10-CM | POA: Diagnosis not present

## 2019-11-13 DIAGNOSIS — R1310 Dysphagia, unspecified: Secondary | ICD-10-CM | POA: Diagnosis not present

## 2019-11-13 DIAGNOSIS — I480 Paroxysmal atrial fibrillation: Secondary | ICD-10-CM | POA: Diagnosis not present

## 2019-11-13 DIAGNOSIS — M810 Age-related osteoporosis without current pathological fracture: Secondary | ICD-10-CM | POA: Diagnosis not present

## 2019-11-13 DIAGNOSIS — Z87891 Personal history of nicotine dependence: Secondary | ICD-10-CM | POA: Diagnosis not present

## 2019-11-13 DIAGNOSIS — E785 Hyperlipidemia, unspecified: Secondary | ICD-10-CM | POA: Diagnosis not present

## 2019-11-13 DIAGNOSIS — Z8701 Personal history of pneumonia (recurrent): Secondary | ICD-10-CM | POA: Diagnosis not present

## 2019-11-16 DIAGNOSIS — I739 Peripheral vascular disease, unspecified: Secondary | ICD-10-CM | POA: Diagnosis not present

## 2019-11-16 DIAGNOSIS — G40909 Epilepsy, unspecified, not intractable, without status epilepticus: Secondary | ICD-10-CM | POA: Diagnosis not present

## 2019-11-16 DIAGNOSIS — I69391 Dysphagia following cerebral infarction: Secondary | ICD-10-CM | POA: Diagnosis not present

## 2019-11-16 DIAGNOSIS — I10 Essential (primary) hypertension: Secondary | ICD-10-CM | POA: Diagnosis not present

## 2019-11-16 DIAGNOSIS — D649 Anemia, unspecified: Secondary | ICD-10-CM | POA: Diagnosis not present

## 2019-11-16 DIAGNOSIS — I480 Paroxysmal atrial fibrillation: Secondary | ICD-10-CM | POA: Diagnosis not present

## 2019-11-16 DIAGNOSIS — E785 Hyperlipidemia, unspecified: Secondary | ICD-10-CM | POA: Diagnosis not present

## 2019-11-16 DIAGNOSIS — Z8701 Personal history of pneumonia (recurrent): Secondary | ICD-10-CM | POA: Diagnosis not present

## 2019-11-16 DIAGNOSIS — I4892 Unspecified atrial flutter: Secondary | ICD-10-CM | POA: Diagnosis not present

## 2019-11-16 DIAGNOSIS — M810 Age-related osteoporosis without current pathological fracture: Secondary | ICD-10-CM | POA: Diagnosis not present

## 2019-11-16 DIAGNOSIS — Z9181 History of falling: Secondary | ICD-10-CM | POA: Diagnosis not present

## 2019-11-16 DIAGNOSIS — R1310 Dysphagia, unspecified: Secondary | ICD-10-CM | POA: Diagnosis not present

## 2019-11-16 DIAGNOSIS — I441 Atrioventricular block, second degree: Secondary | ICD-10-CM | POA: Diagnosis not present

## 2019-11-16 DIAGNOSIS — Z7982 Long term (current) use of aspirin: Secondary | ICD-10-CM | POA: Diagnosis not present

## 2019-11-16 DIAGNOSIS — Z87891 Personal history of nicotine dependence: Secondary | ICD-10-CM | POA: Diagnosis not present

## 2019-11-16 DIAGNOSIS — R627 Adult failure to thrive: Secondary | ICD-10-CM | POA: Diagnosis not present

## 2019-11-17 ENCOUNTER — Other Ambulatory Visit: Payer: Self-pay

## 2019-11-17 ENCOUNTER — Other Ambulatory Visit: Payer: PPO

## 2019-11-17 DIAGNOSIS — Z515 Encounter for palliative care: Secondary | ICD-10-CM

## 2019-11-17 NOTE — Progress Notes (Signed)
PATIENT NAME: Richard Mitchell DOB: July 07, 1941 MRN: [id number]  PRIMARY CARE PROVIDER: Marton Redwood, MD  RESPONSIBLE PARTY:  Acct ID - Guarantor Home Phone Work Phone Relationship Acct Type  [id number] - [place]lett,[id number]  Self P/F     [address], [name], [place] [address]    PLAN OF CARE and INTERVENTIONS:               1.  GOALS OF CARE/ ADVANCE CARE PLANNING:  Remain in home with wife and continue to improve.                  2.  PATIENT/CAREGIVER EDUCATION: Education on s/s of infection, education on fall precautions, reviewed meds, support                3.  DISEASE STATUS: RN made scheduled palliative care home visit. Patient sitting in his recliner wife Richard Mitchell in home with patient. Patient complaining of pain in left wrist and reports he has had some pain in his right shoulder. Patient reports right shoulder pain has eased off. Wife reports she has been administering acetaminophen to patient for pain. Wife reports she also applied ice pack to patient's right shoulder. Wife states patient had been taking Advil but she ran out of Advil. Education too wife to apply Biofreeze to wrist and to possibly check at Pharmacy to see if a wrist splint may help discomfort in patients left arm. Patient has not suffered any recent falls. Patient scheduled to see primary care provider next week and will have chest x-ray to make sure pneumonia has cleared. Patient current weight is 147 lbs. Patients O2 SATs on room are currently 96%. Patients breath sounds are clear. Patients heart rate remains decreased currently 52 BPM.  Patients heart rate is regular. Wife reports speech therapist is continuing to visit patient. Patient reports patients appetite is good. Patient continues to ambulate with his rolling walker. Wife reports patient continues to cough with eating and drinking.  Patient has a congested sounding non productive cough at times per wife.  Patient remains independent with bathing. Wife reports  she purees most of patients food. Patient has trace edema in his lower extremities. Wife states she has added a daily multivitamin  to patient's medications. Patient reports he has been sleeping well. Nurse reviewed patient's medications with wife. Patient and wife remain in agreement with palliative care services. Patient and wife encouraged to contact palliative care with questions or concerns.     HISTORY OF PRESENT ILLNESS: Patient is a 79 year old male who resides in home with his wife.  Patient is followed by palliative care and is seen monthly and PRN.     CODE STATUS: DNR  ADVANCED DIRECTIVES: No MOST FORM: No PPS: 50%   PHYSICAL EXAM:   VITALS: Today's Vitals   11/17/19 1130  Weight: 147 lb (66.7 kg)    LUNGS: breath sounds clear, coughing with eating and drinking. CARDIAC: bradycardia, regular heart rate EXTREMITIES: trace edema to LE's SKIN: denies having any open areas of skin breakdown  NEURO: Alert and oriented x 3       Richard Simmer, RN

## 2019-11-20 ENCOUNTER — Encounter: Payer: Self-pay | Admitting: Podiatry

## 2019-11-20 ENCOUNTER — Other Ambulatory Visit: Payer: Self-pay

## 2019-11-20 ENCOUNTER — Ambulatory Visit: Payer: PPO | Admitting: Podiatry

## 2019-11-20 DIAGNOSIS — S9030XA Contusion of unspecified foot, initial encounter: Secondary | ICD-10-CM

## 2019-11-20 DIAGNOSIS — L600 Ingrowing nail: Secondary | ICD-10-CM

## 2019-11-20 DIAGNOSIS — M79674 Pain in right toe(s): Secondary | ICD-10-CM

## 2019-11-20 DIAGNOSIS — B351 Tinea unguium: Secondary | ICD-10-CM

## 2019-11-20 DIAGNOSIS — M79675 Pain in left toe(s): Secondary | ICD-10-CM

## 2019-11-23 DIAGNOSIS — R918 Other nonspecific abnormal finding of lung field: Secondary | ICD-10-CM | POA: Diagnosis not present

## 2019-11-23 NOTE — Progress Notes (Signed)
Subjective:   Patient ID: Richard Mitchell, male   DOB: 79 y.o.   MRN: [id number]   HPI Patient presents very rude who traumatized his left big toenail and he tried to cut it off with a scissor and has had a lot of bleeding and underneath his left foot he is also had bleeding is not sure if he stepped on something.  Patient does not currently smoke and likes to be relatively active and has caregiver with him   Review of Systems  All other systems reviewed and are negative.       Objective:  Physical Exam Vitals and nursing note reviewed.  Constitutional:      Appearance: He is well-developed.  Pulmonary:     Effort: Pulmonary effort is normal.  Musculoskeletal:        General: Normal range of motion.  Skin:    General: Skin is warm.  Neurological:     Mental Status: He is alert.     Vascular status found to be moderately diminished both sharp dull and vibratory and he did have diminished pulses PT DP bilateral.  He does have varicosities he is found to have a severely traumatized left hallux nail and all nails are thickened and incurvated and he cannot cut them and they become tender.  Patient also has some breakdown of tissue subfirst metatarsal left with no proximal edema erythema drainage noted     Assessment:  Traumatized foot with bleeding nail bed secondary to trauma with thick yellow mycotic nail infection 1-5 both feet and no significant indications of infection     Plan:  H&P all conditions reviewed education rendered soaks to be initiated and I debrided nailbeds 1-5 both feet with no iatrogenic bleeding and this can be done on routine visit.  Gave strict instructions of any redness swelling drainage or pain were to occur to let us know immediately

## 2019-11-24 DIAGNOSIS — E785 Hyperlipidemia, unspecified: Secondary | ICD-10-CM | POA: Diagnosis not present

## 2019-11-24 DIAGNOSIS — Z8701 Personal history of pneumonia (recurrent): Secondary | ICD-10-CM | POA: Diagnosis not present

## 2019-11-24 DIAGNOSIS — R627 Adult failure to thrive: Secondary | ICD-10-CM | POA: Diagnosis not present

## 2019-11-24 DIAGNOSIS — I441 Atrioventricular block, second degree: Secondary | ICD-10-CM | POA: Diagnosis not present

## 2019-11-24 DIAGNOSIS — I4892 Unspecified atrial flutter: Secondary | ICD-10-CM | POA: Diagnosis not present

## 2019-11-24 DIAGNOSIS — D649 Anemia, unspecified: Secondary | ICD-10-CM | POA: Diagnosis not present

## 2019-11-24 DIAGNOSIS — M810 Age-related osteoporosis without current pathological fracture: Secondary | ICD-10-CM | POA: Diagnosis not present

## 2019-11-24 DIAGNOSIS — Z87891 Personal history of nicotine dependence: Secondary | ICD-10-CM | POA: Diagnosis not present

## 2019-11-24 DIAGNOSIS — R1310 Dysphagia, unspecified: Secondary | ICD-10-CM | POA: Diagnosis not present

## 2019-11-24 DIAGNOSIS — I739 Peripheral vascular disease, unspecified: Secondary | ICD-10-CM | POA: Diagnosis not present

## 2019-11-24 DIAGNOSIS — I10 Essential (primary) hypertension: Secondary | ICD-10-CM | POA: Diagnosis not present

## 2019-11-24 DIAGNOSIS — I480 Paroxysmal atrial fibrillation: Secondary | ICD-10-CM | POA: Diagnosis not present

## 2019-11-24 DIAGNOSIS — Z7982 Long term (current) use of aspirin: Secondary | ICD-10-CM | POA: Diagnosis not present

## 2019-11-24 DIAGNOSIS — G40909 Epilepsy, unspecified, not intractable, without status epilepticus: Secondary | ICD-10-CM | POA: Diagnosis not present

## 2019-11-24 DIAGNOSIS — Z9181 History of falling: Secondary | ICD-10-CM | POA: Diagnosis not present

## 2019-11-24 DIAGNOSIS — I69391 Dysphagia following cerebral infarction: Secondary | ICD-10-CM | POA: Diagnosis not present

## 2019-11-26 DIAGNOSIS — E785 Hyperlipidemia, unspecified: Secondary | ICD-10-CM | POA: Diagnosis not present

## 2019-11-26 DIAGNOSIS — Z7982 Long term (current) use of aspirin: Secondary | ICD-10-CM | POA: Diagnosis not present

## 2019-11-26 DIAGNOSIS — I4892 Unspecified atrial flutter: Secondary | ICD-10-CM | POA: Diagnosis not present

## 2019-11-26 DIAGNOSIS — I480 Paroxysmal atrial fibrillation: Secondary | ICD-10-CM | POA: Diagnosis not present

## 2019-11-26 DIAGNOSIS — D649 Anemia, unspecified: Secondary | ICD-10-CM | POA: Diagnosis not present

## 2019-11-26 DIAGNOSIS — M810 Age-related osteoporosis without current pathological fracture: Secondary | ICD-10-CM | POA: Diagnosis not present

## 2019-11-26 DIAGNOSIS — I441 Atrioventricular block, second degree: Secondary | ICD-10-CM | POA: Diagnosis not present

## 2019-11-26 DIAGNOSIS — I10 Essential (primary) hypertension: Secondary | ICD-10-CM | POA: Diagnosis not present

## 2019-11-26 DIAGNOSIS — Z8701 Personal history of pneumonia (recurrent): Secondary | ICD-10-CM | POA: Diagnosis not present

## 2019-11-26 DIAGNOSIS — G40909 Epilepsy, unspecified, not intractable, without status epilepticus: Secondary | ICD-10-CM | POA: Diagnosis not present

## 2019-11-26 DIAGNOSIS — I69391 Dysphagia following cerebral infarction: Secondary | ICD-10-CM | POA: Diagnosis not present

## 2019-11-26 DIAGNOSIS — Z87891 Personal history of nicotine dependence: Secondary | ICD-10-CM | POA: Diagnosis not present

## 2019-11-26 DIAGNOSIS — R1310 Dysphagia, unspecified: Secondary | ICD-10-CM | POA: Diagnosis not present

## 2019-11-26 DIAGNOSIS — Z9181 History of falling: Secondary | ICD-10-CM | POA: Diagnosis not present

## 2019-11-26 DIAGNOSIS — I739 Peripheral vascular disease, unspecified: Secondary | ICD-10-CM | POA: Diagnosis not present

## 2019-11-26 DIAGNOSIS — R627 Adult failure to thrive: Secondary | ICD-10-CM | POA: Diagnosis not present

## 2019-11-27 ENCOUNTER — Other Ambulatory Visit (HOSPITAL_COMMUNITY): Payer: Self-pay | Admitting: *Deleted

## 2019-11-27 DIAGNOSIS — R1310 Dysphagia, unspecified: Secondary | ICD-10-CM

## 2019-11-30 DIAGNOSIS — Z8701 Personal history of pneumonia (recurrent): Secondary | ICD-10-CM | POA: Diagnosis not present

## 2019-11-30 DIAGNOSIS — R627 Adult failure to thrive: Secondary | ICD-10-CM | POA: Diagnosis not present

## 2019-11-30 DIAGNOSIS — G40909 Epilepsy, unspecified, not intractable, without status epilepticus: Secondary | ICD-10-CM | POA: Diagnosis not present

## 2019-11-30 DIAGNOSIS — Z9181 History of falling: Secondary | ICD-10-CM | POA: Diagnosis not present

## 2019-11-30 DIAGNOSIS — I4892 Unspecified atrial flutter: Secondary | ICD-10-CM | POA: Diagnosis not present

## 2019-11-30 DIAGNOSIS — E785 Hyperlipidemia, unspecified: Secondary | ICD-10-CM | POA: Diagnosis not present

## 2019-11-30 DIAGNOSIS — Z7982 Long term (current) use of aspirin: Secondary | ICD-10-CM | POA: Diagnosis not present

## 2019-11-30 DIAGNOSIS — I480 Paroxysmal atrial fibrillation: Secondary | ICD-10-CM | POA: Diagnosis not present

## 2019-11-30 DIAGNOSIS — I441 Atrioventricular block, second degree: Secondary | ICD-10-CM | POA: Diagnosis not present

## 2019-11-30 DIAGNOSIS — M810 Age-related osteoporosis without current pathological fracture: Secondary | ICD-10-CM | POA: Diagnosis not present

## 2019-11-30 DIAGNOSIS — D649 Anemia, unspecified: Secondary | ICD-10-CM | POA: Diagnosis not present

## 2019-11-30 DIAGNOSIS — I69391 Dysphagia following cerebral infarction: Secondary | ICD-10-CM | POA: Diagnosis not present

## 2019-11-30 DIAGNOSIS — I739 Peripheral vascular disease, unspecified: Secondary | ICD-10-CM | POA: Diagnosis not present

## 2019-11-30 DIAGNOSIS — R1310 Dysphagia, unspecified: Secondary | ICD-10-CM | POA: Diagnosis not present

## 2019-11-30 DIAGNOSIS — Z87891 Personal history of nicotine dependence: Secondary | ICD-10-CM | POA: Diagnosis not present

## 2019-11-30 DIAGNOSIS — I10 Essential (primary) hypertension: Secondary | ICD-10-CM | POA: Diagnosis not present

## 2019-12-01 DIAGNOSIS — Z87891 Personal history of nicotine dependence: Secondary | ICD-10-CM | POA: Diagnosis not present

## 2019-12-01 DIAGNOSIS — Z9181 History of falling: Secondary | ICD-10-CM | POA: Diagnosis not present

## 2019-12-01 DIAGNOSIS — R627 Adult failure to thrive: Secondary | ICD-10-CM | POA: Diagnosis not present

## 2019-12-01 DIAGNOSIS — D649 Anemia, unspecified: Secondary | ICD-10-CM | POA: Diagnosis not present

## 2019-12-01 DIAGNOSIS — M810 Age-related osteoporosis without current pathological fracture: Secondary | ICD-10-CM | POA: Diagnosis not present

## 2019-12-01 DIAGNOSIS — G40909 Epilepsy, unspecified, not intractable, without status epilepticus: Secondary | ICD-10-CM | POA: Diagnosis not present

## 2019-12-01 DIAGNOSIS — I10 Essential (primary) hypertension: Secondary | ICD-10-CM | POA: Diagnosis not present

## 2019-12-01 DIAGNOSIS — Z7982 Long term (current) use of aspirin: Secondary | ICD-10-CM | POA: Diagnosis not present

## 2019-12-01 DIAGNOSIS — E785 Hyperlipidemia, unspecified: Secondary | ICD-10-CM | POA: Diagnosis not present

## 2019-12-01 DIAGNOSIS — I4892 Unspecified atrial flutter: Secondary | ICD-10-CM | POA: Diagnosis not present

## 2019-12-01 DIAGNOSIS — I441 Atrioventricular block, second degree: Secondary | ICD-10-CM | POA: Diagnosis not present

## 2019-12-01 DIAGNOSIS — I739 Peripheral vascular disease, unspecified: Secondary | ICD-10-CM | POA: Diagnosis not present

## 2019-12-01 DIAGNOSIS — R1310 Dysphagia, unspecified: Secondary | ICD-10-CM | POA: Diagnosis not present

## 2019-12-01 DIAGNOSIS — I480 Paroxysmal atrial fibrillation: Secondary | ICD-10-CM | POA: Diagnosis not present

## 2019-12-01 DIAGNOSIS — Z8701 Personal history of pneumonia (recurrent): Secondary | ICD-10-CM | POA: Diagnosis not present

## 2019-12-01 DIAGNOSIS — I69391 Dysphagia following cerebral infarction: Secondary | ICD-10-CM | POA: Diagnosis not present

## 2019-12-04 ENCOUNTER — Other Ambulatory Visit: Payer: Self-pay

## 2019-12-04 ENCOUNTER — Ambulatory Visit (HOSPITAL_COMMUNITY)
Admission: RE | Admit: 2019-12-04 | Discharge: 2019-12-04 | Disposition: A | Discharge status: Home / Self Care | Payer: PPO | Source: Ambulatory Visit | Attending: Internal Medicine | Admitting: Internal Medicine

## 2019-12-04 DIAGNOSIS — R1310 Dysphagia, unspecified: Secondary | ICD-10-CM | POA: Insufficient documentation

## 2019-12-04 DIAGNOSIS — T17900A Unspecified foreign body in respiratory tract, part unspecified causing asphyxiation, initial encounter: Secondary | ICD-10-CM | POA: Diagnosis not present

## 2019-12-04 NOTE — Progress Notes (Signed)
Modified Barium Swallow Progress Note  Patient Details  Name: Richard Mitchell MRN: [id number] Date of Birth: [birth year]  Today's Date: 12/04/2019  Modified Barium Swallow completed.  Full report located under Chart Review in the Imaging Section.  Brief recommendations include the following:  Clinical Impression  Pts dysphagia continues to have a primary positional/structural component given lordosis-like curvature of the cervical spine, reshaping the pharynx into an inefficient form for swallowing mechanism. Pt has adequate timing and ROM with laryngeal elevation, hyoid excursion, base of tongue recruitment,  and epiglottic deflection. But even at the height of elevation and hyoid excursion, he does not fully achieve laryngeal closure via contact of epiglottis to arytenoids. There is entrapment of liquids in the valleculae, vestibule and pyriforms with inability to effectively squeeze the bolus through a wide pharyngeal space and through the UES, positioned at the apex of the curvature of the spine. The pt does sense initial aspriation, tries to sustan airway protection and swallow again. But as soon as laryngeal elevation is released there is significant aspiration of penetrate and pyriform residue. Nectar thick liquids were aspirated in less quantity, but significant aspiration will still occur with thickened textures given increased residue. Most effective strategies were assiting pt into a slight forward head position to approximate the base of tongue to the posterior pharyngeal wall, limiting bolus size with a controlled straw sip, encouraging f/u throat clearing/cough with multiple swallows. Aspiration still occurred but to a lesser degree. Recommend pt continue current diet of soft/puree solids and thin liquids, primarily water based drinks. Respiratory muscle strength training to sustain airway protection may be the greatest benefit therapeutically given chronic nature of deficits.    Swallow  Evaluation Recommendations       SLP Diet Recommendations: Dysphagia 2 (Fine chop) solids;Dysphagia 1 (Puree) solids;Thin liquid   Liquid Administration via: Cup;Straw   Medication Administration: Crushed with puree   Supervision: Intermittent supervision to cue for compensatory strategies   Compensations: Slow rate;Small sips/bites;Follow solids with liquid;Multiple dry swallows after each bite/sip;Clear throat after each swallow;Hard cough after swallow;Effortful swallow;Chin tuck;Use straw to facilitate chin tuck   Postural Changes: Seated upright at 90 degrees;Remain semi-upright after after feeds/meals (Comment)            Shalaina Guardiola, Katherene Ponto 12/04/2019,2:30 PM

## 2019-12-09 DIAGNOSIS — I69391 Dysphagia following cerebral infarction: Secondary | ICD-10-CM | POA: Diagnosis not present

## 2019-12-09 DIAGNOSIS — G40909 Epilepsy, unspecified, not intractable, without status epilepticus: Secondary | ICD-10-CM | POA: Diagnosis not present

## 2019-12-09 DIAGNOSIS — I480 Paroxysmal atrial fibrillation: Secondary | ICD-10-CM | POA: Diagnosis not present

## 2019-12-09 DIAGNOSIS — R1310 Dysphagia, unspecified: Secondary | ICD-10-CM | POA: Diagnosis not present

## 2019-12-09 DIAGNOSIS — D649 Anemia, unspecified: Secondary | ICD-10-CM | POA: Diagnosis not present

## 2019-12-09 DIAGNOSIS — E785 Hyperlipidemia, unspecified: Secondary | ICD-10-CM | POA: Diagnosis not present

## 2019-12-09 DIAGNOSIS — I739 Peripheral vascular disease, unspecified: Secondary | ICD-10-CM | POA: Diagnosis not present

## 2019-12-09 DIAGNOSIS — R627 Adult failure to thrive: Secondary | ICD-10-CM | POA: Diagnosis not present

## 2019-12-09 DIAGNOSIS — Z9181 History of falling: Secondary | ICD-10-CM | POA: Diagnosis not present

## 2019-12-09 DIAGNOSIS — I441 Atrioventricular block, second degree: Secondary | ICD-10-CM | POA: Diagnosis not present

## 2019-12-09 DIAGNOSIS — I10 Essential (primary) hypertension: Secondary | ICD-10-CM | POA: Diagnosis not present

## 2019-12-09 DIAGNOSIS — Z87891 Personal history of nicotine dependence: Secondary | ICD-10-CM | POA: Diagnosis not present

## 2019-12-09 DIAGNOSIS — M810 Age-related osteoporosis without current pathological fracture: Secondary | ICD-10-CM | POA: Diagnosis not present

## 2019-12-09 DIAGNOSIS — Z8701 Personal history of pneumonia (recurrent): Secondary | ICD-10-CM | POA: Diagnosis not present

## 2019-12-09 DIAGNOSIS — I4892 Unspecified atrial flutter: Secondary | ICD-10-CM | POA: Diagnosis not present

## 2019-12-09 DIAGNOSIS — Z7982 Long term (current) use of aspirin: Secondary | ICD-10-CM | POA: Diagnosis not present

## 2019-12-23 DIAGNOSIS — Z7982 Long term (current) use of aspirin: Secondary | ICD-10-CM | POA: Diagnosis not present

## 2019-12-23 DIAGNOSIS — I69391 Dysphagia following cerebral infarction: Secondary | ICD-10-CM | POA: Diagnosis not present

## 2019-12-23 DIAGNOSIS — M810 Age-related osteoporosis without current pathological fracture: Secondary | ICD-10-CM | POA: Diagnosis not present

## 2019-12-23 DIAGNOSIS — Z9181 History of falling: Secondary | ICD-10-CM | POA: Diagnosis not present

## 2019-12-23 DIAGNOSIS — I10 Essential (primary) hypertension: Secondary | ICD-10-CM | POA: Diagnosis not present

## 2019-12-23 DIAGNOSIS — I441 Atrioventricular block, second degree: Secondary | ICD-10-CM | POA: Diagnosis not present

## 2019-12-23 DIAGNOSIS — G40909 Epilepsy, unspecified, not intractable, without status epilepticus: Secondary | ICD-10-CM | POA: Diagnosis not present

## 2019-12-23 DIAGNOSIS — D649 Anemia, unspecified: Secondary | ICD-10-CM | POA: Diagnosis not present

## 2019-12-23 DIAGNOSIS — I480 Paroxysmal atrial fibrillation: Secondary | ICD-10-CM | POA: Diagnosis not present

## 2019-12-23 DIAGNOSIS — E785 Hyperlipidemia, unspecified: Secondary | ICD-10-CM | POA: Diagnosis not present

## 2019-12-23 DIAGNOSIS — Z87891 Personal history of nicotine dependence: Secondary | ICD-10-CM | POA: Diagnosis not present

## 2019-12-23 DIAGNOSIS — R627 Adult failure to thrive: Secondary | ICD-10-CM | POA: Diagnosis not present

## 2019-12-23 DIAGNOSIS — R1310 Dysphagia, unspecified: Secondary | ICD-10-CM | POA: Diagnosis not present

## 2019-12-23 DIAGNOSIS — Z8701 Personal history of pneumonia (recurrent): Secondary | ICD-10-CM | POA: Diagnosis not present

## 2019-12-23 DIAGNOSIS — I739 Peripheral vascular disease, unspecified: Secondary | ICD-10-CM | POA: Diagnosis not present

## 2019-12-23 DIAGNOSIS — I4892 Unspecified atrial flutter: Secondary | ICD-10-CM | POA: Diagnosis not present

## 2019-12-29 ENCOUNTER — Telehealth: Payer: Self-pay

## 2020-01-01 NOTE — Telephone Encounter (Signed)
none

## 2020-01-05 ENCOUNTER — Telehealth: Payer: Self-pay

## 2020-01-05 ENCOUNTER — Other Ambulatory Visit: Payer: Self-pay

## 2020-01-05 ENCOUNTER — Other Ambulatory Visit: Payer: PPO

## 2020-01-05 DIAGNOSIS — Z515 Encounter for palliative care: Secondary | ICD-10-CM

## 2020-01-05 NOTE — Progress Notes (Signed)
COMMUNITY PALLIATIVE CARE SW NOTE  PATIENT NAME: Richard Mitchell DOB: [birth year] MRN: [id number]  PRIMARY CARE PROVIDER: Marton Redwood, MD  RESPONSIBLE PARTY:  Acct ID - Guarantor Home Phone Work Phone Relationship Acct Type  [id number] - Py[place],[phone]  Self P/F     [address], [place], [place] [address]     PLAN OF CARE and INTERVENTIONS:             1. GOALS OF CARE/ ADVANCE CARE PLANNING: Patient is a DNR. Goal is to "keep living" and gain more strength in his legs. 2. SOCIAL/EMOTIONAL/SPIRITUAL ASSESSMENT/ INTERVENTIONS:  SW completed scheduled in home palliative care visit. SW  met with patient and his wife [name]. Patient and wife shares that patient has been doing well since last visit. Patient shared that he had a pain in his lower chest area that worried him last, wife shared that she feels It may have been something that patient ate and caused indigestion or heart burn. Patein is eating well. Patient had a swallow study done by speech pathologist, no significant findings, patient continues on pureed foods, with thin liquids. Patient shared that he enjoys drinking pepsi, tea and coffee. SW and patients wife educated patient on importance if drinking water as well. Patient sleeps well, and goes to bed early, patient naps thoroughout the day. Patient has not had any medication changes.Pati3ent is scheduled to have a follow up X-ray done Aug 17th 2021, to ensure PNA is not present. SW provided safety information and education on ambulation with proper medical equipment. Patient has not had any recent falls. Both patient and wife have had both doses of COVID vaccine. Patient did not have any other needs or concerns for SW at this time.  3. PATIENT/CAREGIVER EDUCATION/ COPING:  Patient is alert, oriented. Patient is forgetful. Denied any coping concerns. Wife supportive of care. Daughter is supportive as well and visits when she can. 4. PERSONAL EMERGENCY PLAN:  Family will call  9-1-1 for emergencies. 5. COMMUNITY RESOURCES COORDINATION/ HEALTH CARE NAVIGATION:  Wife manges care. 6. FINANCIAL/LEGAL CONCERNS/INTERVENTIONS:  None     SOCIAL HX:  Social History   Tobacco Use   Smoking status: Former Smoker    Quit date: 11/19/1987    Years since quitting: 32.1   Smokeless tobacco: Never Used  Substance Use Topics   Alcohol use: No    Alcohol/week: 0.0 standard drinks    CODE STATUS: DNR  ADVANCED DIRECTIVES: N MOST FORM COMPLETE:  Y HOSPICE EDUCATION PROVIDED: No  HFW:YOVZCHY is ambulating with walker and uses SPC outside. Patient is able to feed himself, dress himself, and bathe himself with standby.   Time Spent: Visit lasted 16mn.    ADoreene Eland LSouth Wind [place]

## 2020-01-05 NOTE — Telephone Encounter (Signed)
na

## 2020-01-12 ENCOUNTER — Telehealth: Payer: Self-pay

## 2020-01-12 NOTE — Telephone Encounter (Signed)
Telephone call from wife returning RN's call to schedule palliative care visit.  Wife in agreement with palliative care home visit 01/20/20 at 1:00 PM.

## 2020-01-12 NOTE — Telephone Encounter (Signed)
Telephone call to schedule palliative care visit.  RN LM requesting wife call back to schedule palliative care visit.

## 2020-01-20 ENCOUNTER — Other Ambulatory Visit: Payer: Self-pay

## 2020-01-20 ENCOUNTER — Other Ambulatory Visit: Payer: PPO

## 2020-01-20 NOTE — Progress Notes (Signed)
PATIENT NAME: Richard Mitchell DOB: May 13, 1941 MRN: [id number]  PRIMARY CARE PROVIDER: Marton Redwood, MD  RESPONSIBLE PARTY:  Acct ID - Guarantor Home Phone Work Phone Relationship Acct Type  [id number] - [place],[phone]  Self P/F     [address], [name], [place] [address]    PLAN OF CARE and INTERVENTIONS:               1.  GOALS OF CARE/ ADVANCE CARE PLANNING:  Remain in home with wife and be comfortable.               2.  PATIENT/CAREGIVER EDUCATION:  Education on fall precautions, education on s/s of infection, reviewed meds, support                3.  DISEASE STATUS:RN made scheduled palliative care home visit. Nurse met with patient and his wife Richard Mitchell. Patient sitting in his chair watching TV. Wife reports patient did ride out to the store with her last week but did not get out of the car. Patient continues to ambulate with his rolling walker. Patient has not suffered any recent falls. Patient denies pain at the present time but reports he does have some soreness in his right shoulder and wrist that he feels is arthritis. Patient has not had any MD appointments since nurse made last visit.  Wife reports patient is scheduled to have a chest x-ray in August and will see Doctor Brigitte Pulse in October. Patient remains independent with showering and wife states she is in the home if patient gets a shower due to risk of falls. Patient reports his appetite is good. Patients wife reports she purees most of patients food other than fish.  Patients breath sounds are diminished throughout. Patient denies having any cough or shortness of breath. Patients heart rate bradycardic today currently 53 beats per minute. Patient has trace edema in his lower extremities. Nurse reviewed patient's medications with patient and wife.  Wife reports patient does gets confused at times.  Patient and wife remain in agreement with palliative care services. Patient and  Wife encouraged to contact palliative care with  questions or concerns.     HISTORY OF PRESENT ILLNESS: Patient is a 79 year old make who resides in home with his wife Richard Mitchell.  Patient is followed by palliative care and is seen monthly and PRN.   CODE STATUS: DNR  ADVANCED DIRECTIVES: N MOST FORM: No PPS: 50%   PHYSICAL EXAM:   VITALS: Today's Vitals   01/20/20 1323  PainSc: 0-No pain    LUNGS: decreased breath sounds CARDIAC: Cor Brady  EXTREMITIES: Trace edema SKIN: Skin color, texture, turgor normal. No rashes or lesions  NEURO: positive for gait problems and memory problems       Nilda Simmer, RN

## 2020-01-31 ENCOUNTER — Ambulatory Visit (HOSPITAL_COMMUNITY)
Admission: EM | Admit: 2020-01-31 | Discharge: 2020-01-31 | Disposition: A | Discharge status: Home / Self Care | Payer: PPO | Source: Home / Self Care

## 2020-01-31 ENCOUNTER — Encounter (HOSPITAL_COMMUNITY): Payer: Self-pay

## 2020-01-31 ENCOUNTER — Ambulatory Visit (INDEPENDENT_AMBULATORY_CARE_PROVIDER_SITE_OTHER): Payer: PPO

## 2020-01-31 DIAGNOSIS — S52601A Unspecified fracture of lower end of right ulna, initial encounter for closed fracture: Secondary | ICD-10-CM

## 2020-01-31 DIAGNOSIS — M25531 Pain in right wrist: Secondary | ICD-10-CM | POA: Diagnosis not present

## 2020-01-31 DIAGNOSIS — M25431 Effusion, right wrist: Secondary | ICD-10-CM | POA: Diagnosis not present

## 2020-01-31 MED ORDER — ACETAMINOPHEN 500 MG PO TABS: 1000.0000 mg | ORAL_TABLET | Freq: Three times a day (TID) | ORAL | 0 refills | Status: DC | PRN

## 2020-01-31 NOTE — ED Triage Notes (Signed)
Patient is here today with his spouse with complaints of right wrist pain that began last night. Patient states as he was trying to get his balance walking with his walker he reach for the wall with his right hand and his right wrist got caught in between the door and the door jam.

## 2020-01-31 NOTE — ED Provider Notes (Signed)
[place]    CSN: [id number] Arrival date & time: 01/31/20  1211      History   Chief Complaint Chief Complaint  Patient presents with  . Wrist Pain    HPI Richard Mitchell is a 79 y.o. male.   Richard Mitchell presents with his wife with complaints of right wrist pain after injury last night. He was in the restroom, slipped, and fell forward, catching himself with his right wrist. The door closed onto his wrist, catching his wrist between door and door frame. Pain, swelling and bruising since. He is right handed. Some tingling sensation. Took tylenol and advil which have helped with pain. See active problem list.     ROS per HPI, negative if not otherwise mentioned.      Past Medical History:  Diagnosis Date  . Anemia   . Cancer ([place])    [place]  . Carotid artery occlusion   . Dysphasia S/P CVA (cerebrovascular accident)   . Gait disturbance 11/12/2012  . History of subdural hematoma (post traumatic) Approx 1998  . Hypertension   . Keratosis 11/12/2012   x2  . Onychomycosis 11/12/2012   extreme neglect x 10  . Osteoporosis   . PAD (peripheral artery disease) ([place]) 11/12/2012   possible due to diminished pedal pulses  . Seizure disorder [facility])     Patient Active Problem List   Diagnosis Date Noted  . Mobitz type 1 second degree AV block 08/10/2019  . Paroxysmal A-fib ([place]) 08/10/2019  . Atrial flutter ([place]) 08/10/2019  . Palliative care by specialist   . DNR (do not resuscitate)   . Adult failure to thrive   . Dysphagia 08/05/2019  . Generalized weakness 08/05/2019  . Fall at home, initial encounter 08/05/2019  . Aspiration pneumonia ([place]) 08/04/2019  . Essential hypertension 01/21/2018  . Hyperlipidemia 01/21/2018  . Aftercare following surgery of the circulatory system, [place] 03/03/2014  . Occlusion and stenosis of carotid artery with cerebral infarction 03/03/2014  . PAD (peripheral artery disease) ([place])   . Onychomycosis   . Keratosis   .  Occlusion and stenosis of carotid artery without mention of cerebral infarction 02/24/2013  . Gait disturbance 11/12/2012    Past Surgical History:  Procedure Laterality Date  . CAROTID ENDARTERECTOMY Left 10-13-2009  . SUBDURAL HEMATOMA EVACUATION VIA CRANIOTOMY         Home Medications    Prior to Admission medications   Medication Sig Start Date End Date Taking? Authorizing Provider  acetaminophen (TYLENOL) 500 MG tablet Take 2 tablets (1,000 mg total) by mouth every 8 (eight) hours as needed. 01/31/20   Zigmund Gottron, NP  aspirin 81 MG tablet Take 81 mg by mouth daily.    [provider]  carbamazepine (TEGRETOL) 200 MG tablet Take 400 mg by mouth 2 (two) times daily.     [provider]  Carboxymethylcellul-Glycerin (CLEAR EYES FOR DRY EYES OP) Place 1 drop into both eyes 2 (two) times daily.    [provider]  rosuvastatin (CRESTOR) 20 MG tablet Take 20 mg by mouth daily.  10/20/16   [provider]  Vitamin D, Ergocalciferol, (DRISDOL) 50000 UNITS CAPS Take 50,000 Units by mouth every 7 (seven) days.    [provider]    Family History Family History  Problem Relation Age of Onset  . Heart disease Mother        had pacemaker  . Cancer Mother        Breast  .  Heart disease Sister   . Cancer Sister        Pancreatic  . Deep vein thrombosis Sister   . Heart attack Sister   . COPD Father     Social History Social History   Tobacco Use  . Smoking status: Former Smoker    Quit date: 11/19/1987    Years since quitting: 32.2  . Smokeless tobacco: Never Used  Vaping Use  . Vaping Use: Never used  Substance Use Topics  . Alcohol use: No    Alcohol/week: 0.0 standard drinks  . Drug use: No     Allergies   Patient has no known allergies.   Review of Systems Review of Systems   Physical Exam Triage Vital Signs ED Triage Vitals  Enc Vitals Group     BP 01/31/20 1256 (!) 133/58     Pulse Rate 01/31/20 1256 69      Resp 01/31/20 1256 18     Temp 01/31/20 1256 99.5 F (37.5 C)     Temp Source 01/31/20 1256 Oral     SpO2 01/31/20 1256 97 %     Weight --      Height --      Head Circumference --      Peak Flow --      Pain Score 01/31/20 1301 9     Pain Loc --      Pain Edu? --      Excl. in [place]? --    No data found.  Updated Vital Signs BP (!) 133/58 (BP Location: Right Arm)   Pulse 69   Temp 99.5 F (37.5 C) (Oral)   Resp 18   SpO2 97%    Physical Exam Constitutional:      Appearance: He is well-developed.     Comments: Chronically ill appearing man in wheelchair   Cardiovascular:     Rate and Rhythm: Normal rate.  Pulmonary:     Effort: Pulmonary effort is normal.  Musculoskeletal:     Right wrist: Swelling, tenderness and bony tenderness present. No lacerations. Decreased range of motion.     Comments: Bruising, particularly to ulnar aspect with very small skin tears noted; generally tender and overall general decreased ROM. Strong radial pulse and movement of fingers  Skin:    General: Skin is warm and dry.  Neurological:     Mental Status: He is alert and oriented to person, place, and time.      UC Treatments / Results  Labs (all labs ordered are listed, but only abnormal results are displayed) Labs Reviewed - No data to display  EKG   Radiology DG Wrist Complete Right  Result Date: 01/31/2020 CLINICAL DATA:  RIGHT wrist pain after catching a fall in the bathroom, constant pain with swelling, limited range of motion EXAM: RIGHT WRIST - COMPLETE 3+ VIEW COMPARISON:  None FINDINGS: Osseous demineralization. Joint space narrowing at STT joint and third MCP joint. Displaced fracture of the distal RIGHT ulnar metadiaphysis. No additional fracture, dislocation, or bone destruction. Small vessel vascular calcifications. IMPRESSION: Displaced RIGHT ulnar metadiaphyseal fracture. Osseous demineralization with scattered degenerative changes. Electronically Signed   By: Lavonia Dana M.D.   On: 01/31/2020 13:47    Procedures Procedures (including critical care time)  Medications Ordered in UC Medications - No data to display  Initial Impression / Assessment and Plan / UC Course  I have reviewed the triage vital signs and the nursing notes.  Pertinent labs & imaging results that were  available during my care of the patient were reviewed by me and considered in my medical decision making (see chart for details).     Right ulnar fracture s/p fall last night. Sugar tong placed, tylenol for pain control. Follow up with orthopedics, [facility] recommended. Patient and wife verbalized understanding and agreeable to plan.   Final Clinical Impressions(s) / UC Diagnoses   Final diagnoses:  Closed fracture of distal end of right ulna, unspecified fracture morphology, initial encounter     Discharge Instructions     You have a distal Ulnar fracture.  Keep splint in place.  Ice, elevation, tylenol as needed for pain.  Please call [facility] tomorrow to set up follow up for definitive treatment of this.    ED Prescriptions    Medication Sig Dispense Auth. Provider   acetaminophen (TYLENOL) 500 MG tablet Take 2 tablets (1,000 mg total) by mouth every 8 (eight) hours as needed. 30 tablet Zigmund Gottron, NP     PDMP not reviewed this encounter.   Zigmund Gottron, NP 01/31/20 1434

## 2020-01-31 NOTE — Progress Notes (Signed)
Orthopedic Tech Progress Note Patient Details:  Richard Mitchell 06/17/1941 [id number]  Ortho Devices Type of Ortho Device: Sugartong splint Ortho Device/Splint Location: URE Ortho Device/Splint Interventions: Application, Ordered   Post Interventions Patient Tolerated: Well Instructions Provided: Care of device   Tyjuan Demetro A Ravindra Baranek 01/31/2020, 3:06 PM

## 2020-01-31 NOTE — ED Notes (Signed)
Notified ortho tech that splint has been ordered.

## 2020-01-31 NOTE — Discharge Instructions (Signed)
You have a distal Ulnar fracture.  Keep splint in place.  Ice, elevation, tylenol as needed for pain.  Please call [facility] tomorrow to set up follow up for definitive treatment of this.

## 2020-02-01 ENCOUNTER — Emergency Department (HOSPITAL_COMMUNITY): Payer: PPO

## 2020-02-01 ENCOUNTER — Encounter (HOSPITAL_COMMUNITY): Payer: Self-pay

## 2020-02-01 ENCOUNTER — Inpatient Hospital Stay (HOSPITAL_COMMUNITY)
Admission: EM | Admit: 2020-02-01 | Discharge: 2020-02-05 | DRG: 871 | Disposition: A | Discharge status: Hospice | Payer: PPO | Attending: Family Medicine | Admitting: Family Medicine

## 2020-02-01 ENCOUNTER — Other Ambulatory Visit: Payer: Self-pay

## 2020-02-01 DIAGNOSIS — J9601 Acute respiratory failure with hypoxia: Secondary | ICD-10-CM | POA: Diagnosis present

## 2020-02-01 DIAGNOSIS — I441 Atrioventricular block, second degree: Secondary | ICD-10-CM | POA: Diagnosis not present

## 2020-02-01 DIAGNOSIS — I10 Essential (primary) hypertension: Secondary | ICD-10-CM

## 2020-02-01 DIAGNOSIS — R627 Adult failure to thrive: Secondary | ICD-10-CM | POA: Diagnosis present

## 2020-02-01 DIAGNOSIS — I480 Paroxysmal atrial fibrillation: Secondary | ICD-10-CM | POA: Diagnosis not present

## 2020-02-01 DIAGNOSIS — S52601A Unspecified fracture of lower end of right ulna, initial encounter for closed fracture: Secondary | ICD-10-CM | POA: Diagnosis not present

## 2020-02-01 DIAGNOSIS — I110 Hypertensive heart disease with heart failure: Secondary | ICD-10-CM | POA: Diagnosis present

## 2020-02-01 DIAGNOSIS — R001 Bradycardia, unspecified: Secondary | ICD-10-CM | POA: Diagnosis not present

## 2020-02-01 DIAGNOSIS — Z87820 Personal history of traumatic brain injury: Secondary | ICD-10-CM

## 2020-02-01 DIAGNOSIS — R6521 Severe sepsis with septic shock: Secondary | ICD-10-CM | POA: Diagnosis not present

## 2020-02-01 DIAGNOSIS — R4189 Other symptoms and signs involving cognitive functions and awareness: Secondary | ICD-10-CM | POA: Diagnosis present

## 2020-02-01 DIAGNOSIS — Y92002 Bathroom of unspecified non-institutional (private) residence single-family (private) house as the place of occurrence of the external cause: Secondary | ICD-10-CM | POA: Diagnosis not present

## 2020-02-01 DIAGNOSIS — J90 Pleural effusion, not elsewhere classified: Secondary | ICD-10-CM | POA: Diagnosis not present

## 2020-02-01 DIAGNOSIS — W010XXA Fall on same level from slipping, tripping and stumbling without subsequent striking against object, initial encounter: Secondary | ICD-10-CM | POA: Diagnosis present

## 2020-02-01 DIAGNOSIS — S52292A Other fracture of shaft of left ulna, initial encounter for closed fracture: Secondary | ICD-10-CM | POA: Diagnosis not present

## 2020-02-01 DIAGNOSIS — Z85828 Personal history of other malignant neoplasm of skin: Secondary | ICD-10-CM

## 2020-02-01 DIAGNOSIS — I499 Cardiac arrhythmia, unspecified: Secondary | ICD-10-CM | POA: Diagnosis not present

## 2020-02-01 DIAGNOSIS — G9340 Encephalopathy, unspecified: Secondary | ICD-10-CM | POA: Diagnosis not present

## 2020-02-01 DIAGNOSIS — Z20822 Contact with and (suspected) exposure to covid-19: Secondary | ICD-10-CM | POA: Diagnosis present

## 2020-02-01 DIAGNOSIS — G40909 Epilepsy, unspecified, not intractable, without status epilepticus: Secondary | ICD-10-CM | POA: Diagnosis not present

## 2020-02-01 DIAGNOSIS — R918 Other nonspecific abnormal finding of lung field: Secondary | ICD-10-CM | POA: Diagnosis not present

## 2020-02-01 DIAGNOSIS — Z9111 Patient's noncompliance with dietary regimen: Secondary | ICD-10-CM

## 2020-02-01 DIAGNOSIS — M810 Age-related osteoporosis without current pathological fracture: Secondary | ICD-10-CM | POA: Diagnosis present

## 2020-02-01 DIAGNOSIS — I739 Peripheral vascular disease, unspecified: Secondary | ICD-10-CM | POA: Diagnosis present

## 2020-02-01 DIAGNOSIS — R531 Weakness: Secondary | ICD-10-CM | POA: Diagnosis not present

## 2020-02-01 DIAGNOSIS — J850 Gangrene and necrosis of lung: Secondary | ICD-10-CM | POA: Diagnosis present

## 2020-02-01 DIAGNOSIS — A419 Sepsis, unspecified organism: Secondary | ICD-10-CM | POA: Diagnosis not present

## 2020-02-01 DIAGNOSIS — R0902 Hypoxemia: Secondary | ICD-10-CM | POA: Diagnosis not present

## 2020-02-01 DIAGNOSIS — Z7189 Other specified counseling: Secondary | ICD-10-CM

## 2020-02-01 DIAGNOSIS — I708 Atherosclerosis of other arteries: Secondary | ICD-10-CM | POA: Diagnosis not present

## 2020-02-01 DIAGNOSIS — Z66 Do not resuscitate: Secondary | ICD-10-CM | POA: Diagnosis not present

## 2020-02-01 DIAGNOSIS — Z79899 Other long term (current) drug therapy: Secondary | ICD-10-CM

## 2020-02-01 DIAGNOSIS — I509 Heart failure, unspecified: Secondary | ICD-10-CM | POA: Diagnosis present

## 2020-02-01 DIAGNOSIS — J439 Emphysema, unspecified: Secondary | ICD-10-CM | POA: Diagnosis not present

## 2020-02-01 DIAGNOSIS — S52201A Unspecified fracture of shaft of right ulna, initial encounter for closed fracture: Secondary | ICD-10-CM | POA: Diagnosis not present

## 2020-02-01 DIAGNOSIS — J189 Pneumonia, unspecified organism: Secondary | ICD-10-CM | POA: Diagnosis not present

## 2020-02-01 DIAGNOSIS — Z7982 Long term (current) use of aspirin: Secondary | ICD-10-CM

## 2020-02-01 DIAGNOSIS — I4892 Unspecified atrial flutter: Secondary | ICD-10-CM | POA: Diagnosis present

## 2020-02-01 DIAGNOSIS — R000 Tachycardia, unspecified: Secondary | ICD-10-CM | POA: Diagnosis not present

## 2020-02-01 DIAGNOSIS — I69321 Dysphasia following cerebral infarction: Secondary | ICD-10-CM | POA: Diagnosis not present

## 2020-02-01 DIAGNOSIS — I4891 Unspecified atrial fibrillation: Secondary | ICD-10-CM | POA: Diagnosis not present

## 2020-02-01 DIAGNOSIS — Z515 Encounter for palliative care: Secondary | ICD-10-CM | POA: Diagnosis not present

## 2020-02-01 DIAGNOSIS — Z87891 Personal history of nicotine dependence: Secondary | ICD-10-CM

## 2020-02-01 DIAGNOSIS — R296 Repeated falls: Secondary | ICD-10-CM | POA: Diagnosis not present

## 2020-02-01 DIAGNOSIS — Z7401 Bed confinement status: Secondary | ICD-10-CM

## 2020-02-01 DIAGNOSIS — Z8249 Family history of ischemic heart disease and other diseases of the circulatory system: Secondary | ICD-10-CM

## 2020-02-01 DIAGNOSIS — J690 Pneumonitis due to inhalation of food and vomit: Secondary | ICD-10-CM | POA: Diagnosis not present

## 2020-02-01 DIAGNOSIS — I700 Atherosclerosis of aorta: Secondary | ICD-10-CM | POA: Diagnosis not present

## 2020-02-01 LAB — COMPREHENSIVE METABOLIC PANEL
ALT: 40 U/L (ref 0–44)
AST: 69 U/L — ABNORMAL HIGH (ref 15–41)
Albumin: 3.2 g/dL — ABNORMAL LOW (ref 3.5–5.0)
Alkaline Phosphatase: 216 U/L — ABNORMAL HIGH (ref 38–126)
Anion gap: 12 (ref 5–15)
BUN: 11 mg/dL (ref 8–23)
CO2: 25 mmol/L (ref 22–32)
Calcium: 8.9 mg/dL (ref 8.9–10.3)
Chloride: 99 mmol/L (ref 98–111)
Creatinine, Ser: 0.97 mg/dL (ref 0.61–1.24)
GFR calc Af Amer: >60 mL/min (ref >60)
GFR calc non Af Amer: >60 mL/min (ref >60)
Glucose, Bld: 119 mg/dL — ABNORMAL HIGH (ref 70–99)
Potassium: 3.6 mmol/L (ref 3.5–5.1)
Sodium: 136 mmol/L (ref 135–145)
Total Bilirubin: 0.9 mg/dL (ref 0.3–1.2)
Total Protein: 7.4 g/dL (ref 6.5–8.1)

## 2020-02-01 LAB — CBC WITH DIFFERENTIAL/PLATELET
Abs Immature Granulocytes: 0.18 10*3/uL — ABNORMAL HIGH (ref 0.00–0.07)
Basophils Absolute: 0 10*3/uL (ref 0.0–0.1)
Basophils Relative: 0 %
Eosinophils Absolute: 0 10*3/uL (ref 0.0–0.5)
Eosinophils Relative: 0 %
HCT: 39.8 % (ref 39.0–52.0)
Hemoglobin: 12.6 g/dL — ABNORMAL LOW (ref 13.0–17.0)
Immature Granulocytes: 1 %
Lymphocytes Relative: 4 %
Lymphs Abs: 0.7 10*3/uL (ref 0.7–4.0)
MCH: 29.4 pg (ref 26.0–34.0)
MCHC: 31.7 g/dL (ref 30.0–36.0)
MCV: 92.8 fL (ref 80.0–100.0)
Monocytes Absolute: 1.8 10*3/uL — ABNORMAL HIGH (ref 0.1–1.0)
Monocytes Relative: 9 %
Neutro Abs: 16.2 10*3/uL — ABNORMAL HIGH (ref 1.7–7.7)
Neutrophils Relative %: 86 %
Platelets: 268 10*3/uL (ref 150–400)
RBC: 4.29 MIL/uL (ref 4.22–5.81)
RDW: 13.5 % (ref 11.5–15.5)
WBC: 18.8 10*3/uL — ABNORMAL HIGH (ref 4.0–10.5)
nRBC: 0 % (ref 0.0–0.2)

## 2020-02-01 LAB — CBC
HCT: 30.6 % — ABNORMAL LOW (ref 39.0–52.0)
Hemoglobin: 10 g/dL — ABNORMAL LOW (ref 13.0–17.0)
MCH: 29.9 pg (ref 26.0–34.0)
MCHC: 32.7 g/dL (ref 30.0–36.0)
MCV: 91.6 fL (ref 80.0–100.0)
Platelets: 242 10*3/uL (ref 150–400)
RBC: 3.34 MIL/uL — ABNORMAL LOW (ref 4.22–5.81)
RDW: 13.5 % (ref 11.5–15.5)
WBC: 17.3 10*3/uL — ABNORMAL HIGH (ref 4.0–10.5)
nRBC: 0 % (ref 0.0–0.2)

## 2020-02-01 LAB — URINALYSIS, ROUTINE W REFLEX MICROSCOPIC
Bacteria, UA: NONE SEEN
Bilirubin Urine: NEGATIVE
Glucose, UA: NEGATIVE mg/dL
Hgb urine dipstick: NEGATIVE
Ketones, ur: 5 mg/dL — AB
Leukocytes,Ua: NEGATIVE
Nitrite: NEGATIVE
Protein, ur: 30 mg/dL — AB
Specific Gravity, Urine: 1.02 (ref 1.005–1.030)
pH: 5 (ref 5.0–8.0)

## 2020-02-01 LAB — PROCALCITONIN: Procalcitonin: 0.28 ng/mL

## 2020-02-01 LAB — APTT: aPTT: 37 seconds — ABNORMAL HIGH (ref 24–36)

## 2020-02-01 LAB — CREATININE, SERUM
Creatinine, Ser: 0.84 mg/dL (ref 0.61–1.24)
GFR calc Af Amer: >60 mL/min (ref >60)
GFR calc non Af Amer: >60 mL/min (ref >60)

## 2020-02-01 LAB — PROTIME-INR
INR: 1.5 — ABNORMAL HIGH (ref 0.8–1.2)
Prothrombin Time: 17.4 seconds — ABNORMAL HIGH (ref 11.4–15.2)

## 2020-02-01 LAB — CBG MONITORING, ED
Glucose-Capillary: 116 mg/dL — ABNORMAL HIGH (ref 70–99)
Glucose-Capillary: 91 mg/dL (ref 70–99)

## 2020-02-01 LAB — LACTIC ACID, PLASMA
Lactic Acid, Venous: 1.8 mmol/L (ref 0.5–1.9)
Lactic Acid, Venous: 1 mmol/L (ref 0.5–1.9)

## 2020-02-01 LAB — TROPONIN I (HIGH SENSITIVITY)
Troponin I (High Sensitivity): 19 ng/L — ABNORMAL HIGH (ref <18)
Troponin I (High Sensitivity): 20 ng/L — ABNORMAL HIGH (ref <18)

## 2020-02-01 LAB — SARS CORONAVIRUS 2 BY RT PCR (HOSPITAL ORDER, PERFORMED IN ~~LOC~~ HOSPITAL LAB): SARS Coronavirus 2: NEGATIVE

## 2020-02-01 LAB — BRAIN NATRIURETIC PEPTIDE: B Natriuretic Peptide: 420 pg/mL — ABNORMAL HIGH (ref 0.0–100.0)

## 2020-02-01 LAB — TSH: TSH: 1.176 u[IU]/mL (ref 0.350–4.500)

## 2020-02-01 MED ORDER — OXYCODONE HCL 5 MG PO TABS
5.0000 mg | ORAL_TABLET | ORAL | Status: DC | PRN
  Administered 2020-02-02 – 2020-02-04 (×4): 5 mg via ORAL
  Filled 2020-02-01 (×5): qty 1

## 2020-02-01 MED ORDER — SODIUM CHLORIDE 0.9 % IV SOLN
2.0000 g | INTRAVENOUS | Status: DC
  Administered 2020-02-01: 2 g via INTRAVENOUS
  Filled 2020-02-01: qty 20

## 2020-02-01 MED ORDER — SODIUM CHLORIDE 0.9 % IV SOLN
500.0000 mg | INTRAVENOUS | Status: DC
  Administered 2020-02-01 – 2020-02-02 (×2): 500 mg via INTRAVENOUS
  Filled 2020-02-01 (×3): qty 500

## 2020-02-01 MED ORDER — VITAMIN D (ERGOCALCIFEROL) 1.25 MG (50000 UNIT) PO CAPS
50000.0000 [IU] | ORAL_CAPSULE | ORAL | Status: DC
  Administered 2020-02-02: 50000 [IU] via ORAL
  Filled 2020-02-01: qty 1

## 2020-02-01 MED ORDER — ACETAMINOPHEN 650 MG RE SUPP: 650.0000 mg | Freq: Four times a day (QID) | RECTAL | Status: DC | PRN

## 2020-02-01 MED ORDER — PIPERACILLIN-TAZOBACTAM 3.375 G IVPB
3.3750 g | Freq: Three times a day (TID) | INTRAVENOUS | Status: DC
  Administered 2020-02-02 – 2020-02-03 (×2): 3.375 g via INTRAVENOUS
  Filled 2020-02-01 (×2): qty 50

## 2020-02-01 MED ORDER — PIPERACILLIN-TAZOBACTAM 3.375 G IVPB 30 MIN
3.3750 g | Freq: Once | INTRAVENOUS | Status: AC
  Administered 2020-02-01: 3.375 g via INTRAVENOUS
  Filled 2020-02-01: qty 50

## 2020-02-01 MED ORDER — ACETAMINOPHEN 325 MG PO TABS
650.0000 mg | ORAL_TABLET | Freq: Once | ORAL | Status: AC
  Administered 2020-02-01: 650 mg via ORAL
  Filled 2020-02-01: qty 2

## 2020-02-01 MED ORDER — HEPARIN SODIUM (PORCINE) 5000 UNIT/ML IJ SOLN
5000.0000 [IU] | Freq: Three times a day (TID) | INTRAMUSCULAR | Status: DC
  Administered 2020-02-01 – 2020-02-05 (×11): 5000 [IU] via SUBCUTANEOUS
  Filled 2020-02-01 (×12): qty 1

## 2020-02-01 MED ORDER — SENNOSIDES-DOCUSATE SODIUM 8.6-50 MG PO TABS: 1.0000 | ORAL_TABLET | Freq: Every evening | ORAL | Status: DC | PRN

## 2020-02-01 MED ORDER — SODIUM CHLORIDE 0.9 % IV SOLN: INTRAVENOUS | Status: AC

## 2020-02-01 MED ORDER — CARBAMAZEPINE 200 MG PO TABS
400.0000 mg | ORAL_TABLET | Freq: Two times a day (BID) | ORAL | Status: DC
  Administered 2020-02-01 – 2020-02-05 (×8): 400 mg via ORAL
  Filled 2020-02-01 (×8): qty 2

## 2020-02-01 MED ORDER — IPRATROPIUM-ALBUTEROL 0.5-2.5 (3) MG/3ML IN SOLN
3.0000 mL | Freq: Four times a day (QID) | RESPIRATORY_TRACT | Status: DC
  Administered 2020-02-01 – 2020-02-02 (×4): 3 mL via RESPIRATORY_TRACT
  Filled 2020-02-01 (×3): qty 3

## 2020-02-01 MED ORDER — INSULIN ASPART 100 UNIT/ML ~~LOC~~ SOLN: 0.0000 [IU] | Freq: Three times a day (TID) | SUBCUTANEOUS | Status: DC

## 2020-02-01 MED ORDER — ACETAMINOPHEN 325 MG PO TABS
650.0000 mg | ORAL_TABLET | Freq: Four times a day (QID) | ORAL | Status: DC | PRN
  Administered 2020-02-02 (×2): 650 mg via ORAL
  Filled 2020-02-01 (×2): qty 2

## 2020-02-01 MED ORDER — ONDANSETRON HCL 4 MG PO TABS: 4.0000 mg | ORAL_TABLET | Freq: Four times a day (QID) | ORAL | Status: DC | PRN

## 2020-02-01 MED ORDER — SODIUM CHLORIDE 0.9 % IV BOLUS
1000.0000 mL | Freq: Once | INTRAVENOUS | Status: AC
  Administered 2020-02-01: 1000 mL via INTRAVENOUS

## 2020-02-01 MED ORDER — SODIUM CHLORIDE 0.9 % IV BOLUS
500.0000 mL | Freq: Once | INTRAVENOUS | Status: AC
  Administered 2020-02-01: 500 mL via INTRAVENOUS

## 2020-02-01 MED ORDER — IPRATROPIUM-ALBUTEROL 0.5-2.5 (3) MG/3ML IN SOLN
3.0000 mL | RESPIRATORY_TRACT | Status: DC | PRN
  Administered 2020-02-05: 3 mL via RESPIRATORY_TRACT
  Filled 2020-02-01 (×2): qty 3

## 2020-02-01 MED ORDER — ASPIRIN EC 81 MG PO TBEC
81.0000 mg | DELAYED_RELEASE_TABLET | Freq: Every day | ORAL | Status: DC
  Administered 2020-02-01 – 2020-02-05 (×5): 81 mg via ORAL
  Filled 2020-02-01 (×5): qty 1

## 2020-02-01 MED ORDER — ONDANSETRON HCL 4 MG/2ML IJ SOLN: 4.0000 mg | Freq: Four times a day (QID) | INTRAMUSCULAR | Status: DC | PRN

## 2020-02-01 NOTE — Progress Notes (Signed)
Orthopedic Tech Progress Note Patient Details:  Richard Mitchell [birth year] [id number]  Ortho Devices Type of Ortho Device: Sugartong splint, Arm sling Ortho Device/Splint Location: RUE Ortho Device/Splint Interventions: Application, Ordered   Post Interventions Patient Tolerated: Well Instructions Provided: Care of device   Janit Pagan 02/01/2020, 3:20 PM

## 2020-02-01 NOTE — ED Provider Notes (Signed)
[facility] EMERGENCY DEPARTMENT Provider Note   CSN: [id number] Arrival date & time: 02/01/20  1338     History Chief Complaint  Patient presents with  . Weakness    Richard Mitchell is a 79 y.o. male history of hypertension, PAD, seizures, CVA, carotid artery occlusion, atrial fibrillation.  Patient presents today via EMS for generalized weakness for the past 2-3 days.  Patient reports increasing fatigue and generalized weakness denies any focal or unilateral weakness.  He reports that he was sitting on the toilet today and had difficulty getting up off of the toilet which is when EMS was called.  Patient is unknown certain why he has been feeling increasingly tired over the last few days he denies any associated pain.  He reports that he did have a 2 nights ago where he tripped fracturing his right forearm, he was seen in urgent care and placed in a splint and referred to orthopedist.  He denies head injury, loss of consciousness, blood thinner use.  Patient denies headache, neck stiffness, chest pain/shortness of breath, cough, abdominal pain, vomiting, diarrhea, nausea, dysuria/hematuria, numbness/tingling, unilateral weakness or any additional concerns.  HPI     Past Medical History:  Diagnosis Date  . Anemia   . Cancer ([place])    [place]  . Carotid artery occlusion   . Dysphasia S/P CVA (cerebrovascular accident)   . Gait disturbance 11/12/2012  . History of subdural hematoma (post traumatic) Approx 1998  . Hypertension   . Keratosis 11/12/2012   x2  . Onychomycosis 11/12/2012   extreme neglect x 10  . Osteoporosis   . PAD (peripheral artery disease) ([place]) 11/12/2012   possible due to diminished pedal pulses  . Seizure disorder [facility])     Patient Active Problem List   Diagnosis Date Noted  . Mobitz type 1 second degree AV block 08/10/2019  . Paroxysmal A-fib ([place]) 08/10/2019  . Atrial flutter ([place]) 08/10/2019  . Palliative care by specialist   . DNR  (do not resuscitate)   . Adult failure to thrive   . Dysphagia 08/05/2019  . Generalized weakness 08/05/2019  . Fall at home, initial encounter 08/05/2019  . Aspiration pneumonia ([place]) 08/04/2019  . Essential hypertension 01/21/2018  . Hyperlipidemia 01/21/2018  . Aftercare following surgery of the circulatory system, [place] 03/03/2014  . Occlusion and stenosis of carotid artery with cerebral infarction 03/03/2014  . PAD (peripheral artery disease) ([place])   . Onychomycosis   . Keratosis   . Occlusion and stenosis of carotid artery without mention of cerebral infarction 02/24/2013  . Gait disturbance 11/12/2012    Past Surgical History:  Procedure Laterality Date  . CAROTID ENDARTERECTOMY Left 10-13-2009  . SUBDURAL HEMATOMA EVACUATION VIA CRANIOTOMY         Family History  Problem Relation Age of Onset  . Heart disease Mother        had pacemaker  . Cancer Mother        Breast  . Heart disease Sister   . Cancer Sister        Pancreatic  . Deep vein thrombosis Sister   . Heart attack Sister   . COPD Father     Social History   Tobacco Use  . Smoking status: Former Smoker    Quit date: 11/19/1987    Years since quitting: 32.2  . Smokeless tobacco: Never Used  Vaping Use  . Vaping Use: Never used  Substance Use Topics  . Alcohol use: No  Alcohol/week: 0.0 standard drinks  . Drug use: No    Home Medications Prior to Admission medications   Medication Sig Start Date End Date Taking? Authorizing Provider  acetaminophen (TYLENOL) 500 MG tablet Take 2 tablets (1,000 mg total) by mouth every 8 (eight) hours as needed. Patient taking differently: Take 1,000 mg by mouth daily.  01/31/20  Yes Augusto Gamble B, NP  aspirin 81 MG tablet Take 81 mg by mouth daily.   Yes [provider]  carbamazepine (TEGRETOL) 200 MG tablet Take 400 mg by mouth 2 (two) times daily.    Yes [provider]  Carboxymethylcellul-Glycerin (CLEAR EYES FOR DRY EYES OP) Place 1  drop into both eyes 2 (two) times daily.   Yes [provider]  Vitamin D, Ergocalciferol, (DRISDOL) 50000 UNITS CAPS Take 50,000 Units by mouth every 7 (seven) days.   Yes [provider]  rosuvastatin (CRESTOR) 20 MG tablet Take 20 mg by mouth daily.  Patient not taking: Reported on 02/01/2020 10/20/16   [provider]    Allergies    Patient has no known allergies.  Review of Systems   Review of Systems Ten systems are reviewed and are negative for acute change except as noted in the HPI  Physical Exam Updated Vital Signs BP 103/70   Pulse (!) 112   Temp (!) 100.9 F (38.3 C) (Rectal)   Resp 18   Ht 6\' 2"  (1.88 m)   Wt 68 kg   SpO2 97%   BMI 19.26 kg/m   Physical Exam Constitutional:      General: He is not in acute distress.    Appearance: Normal appearance. He is well-developed. He is not ill-appearing or diaphoretic.  HENT:     Head: Normocephalic and atraumatic.  Eyes:     General: Vision grossly intact. Gaze aligned appropriately.     Pupils: Pupils are equal, round, and reactive to light.  Neck:     Trachea: Trachea and phonation normal.  Pulmonary:     Effort: Pulmonary effort is normal. No respiratory distress.  Abdominal:     General: There is no distension.     Palpations: Abdomen is soft.     Tenderness: There is no abdominal tenderness. There is no guarding or rebound.  Musculoskeletal:        General: Normal range of motion.     Cervical back: Normal range of motion.     Comments: No midline C/T/L spinal tenderness to palpation, no paraspinal muscle tenderness, no deformity, crepitus, or step-off noted. No sign of injury to the neck or back.   Skin:    General: Skin is warm and dry.          Comments: Small abrasion on the right forearm without evidence of cellulitis.  Neurological:     Mental Status: He is alert.     GCS: GCS eye subscore is 4. GCS verbal subscore is 5. GCS motor subscore is 6.     Comments: Speech is  clear and goal oriented, follows commands Major Cranial nerves without deficit, no facial droop Moves extremities without ataxia, coordination intact  Psychiatric:        Behavior: Behavior normal.     ED Results / Procedures / Treatments   Labs (all labs ordered are listed, but only abnormal results are displayed) Labs Reviewed  COMPREHENSIVE METABOLIC PANEL - Abnormal; Notable for the following components:      Result Value   Glucose, Bld 119 (*)  Albumin 3.2 (*)    AST 69 (*)    Alkaline Phosphatase 216 (*)    All other components within normal limits  CBC WITH DIFFERENTIAL/PLATELET - Abnormal; Notable for the following components:   WBC 18.8 (*)    Hemoglobin 12.6 (*)    Neutro Abs 16.2 (*)    Monocytes Absolute 1.8 (*)    Abs Immature Granulocytes 0.18 (*)    All other components within normal limits  APTT - Abnormal; Notable for the following components:   aPTT 37 (*)    All other components within normal limits  PROTIME-INR - Abnormal; Notable for the following components:   Prothrombin Time 17.4 (*)    INR 1.5 (*)    All other components within normal limits  URINALYSIS, ROUTINE W REFLEX MICROSCOPIC - Abnormal; Notable for the following components:   Color, Urine AMBER (*)    Ketones, ur 5 (*)    Protein, ur 30 (*)    All other components within normal limits  TROPONIN I (HIGH SENSITIVITY) - Abnormal; Notable for the following components:   Troponin I (High Sensitivity) 19 (*)    All other components within normal limits  CULTURE, BLOOD (ROUTINE X 2)  CULTURE, BLOOD (ROUTINE X 2)  URINE CULTURE  SARS CORONAVIRUS 2 BY RT PCR (HOSPITAL ORDER, [facility])  LACTIC ACID, PLASMA  LACTIC ACID, PLASMA  POC SARS CORONAVIRUS 2 AG -  ED    EKG EKG Interpretation  Date/Time:  Monday February 01 2020 13:46:51 EDT Ventricular Rate:  117 PR Interval:    QRS Duration: 99 QT Interval:  341 QTC Calculation: 476 R Axis:   -37 Text  Interpretation: Sinus or ectopic atrial tachycardia Left axis deviation Abnormal R-wave progression, early transition Repol abnrm suggests ischemia, diffuse leads No STEMI Confirmed by Octaviano Glow [phone]) on 02/01/2020 2:04:04 PM Also confirmed by Octaviano Glow [phone]), editor Hattie Perch ([id number])  on 02/01/2020 2:05:36 PM   Radiology DG Wrist Complete Right  Result Date: 01/31/2020 CLINICAL DATA:  RIGHT wrist pain after catching a fall in the bathroom, constant pain with swelling, limited range of motion EXAM: RIGHT WRIST - COMPLETE 3+ VIEW COMPARISON:  None FINDINGS: Osseous demineralization. Joint space narrowing at STT joint and third MCP joint. Displaced fracture of the distal RIGHT ulnar metadiaphysis. No additional fracture, dislocation, or bone destruction. Small vessel vascular calcifications. IMPRESSION: Displaced RIGHT ulnar metadiaphyseal fracture. Osseous demineralization with scattered degenerative changes. Electronically Signed   By: Lavonia Dana M.D.   On: 01/31/2020 13:47   DG Chest Port 1 View  Result Date: 02/01/2020 CLINICAL DATA:  Sepsis, hypoxia EXAM: PORTABLE CHEST 1 VIEW COMPARISON:  10/05/2019 FINDINGS: Progression of right lower lobe airspace disease which could be pneumonia. Mild left lower lobe airspace disease unchanged. COPD. Negative for heart failure. Probable small bilateral pleural effusions. IMPRESSION: Progression of right lower lobe airspace disease, probable pneumonia Electronically Signed   By: Franchot Gallo M.D.   On: 02/01/2020 14:17    Procedures .Critical Care Performed by: Deliah Boston, PA-C Authorized by: Deliah Boston, PA-C   Critical care provider statement:    Critical care time (minutes):  31   Critical care was necessary to treat or prevent imminent or life-threatening deterioration of the following conditions:  Sepsis   Critical care was time spent personally by me on the following activities:  Discussions with consultants,  evaluation of patient's response to treatment, examination of patient, ordering and performing treatments and interventions,  ordering and review of laboratory studies, ordering and review of radiographic studies, pulse oximetry, re-evaluation of patient's condition, obtaining history from patient or surrogate, review of old charts and development of treatment plan with patient or surrogate   (including critical care time)  Medications Ordered in ED Medications  cefTRIAXone (ROCEPHIN) 2 g in sodium chloride 0.9 % 100 mL IVPB (2 g Intravenous New Bag/Given 02/01/20 1453)  azithromycin (ZITHROMAX) 500 mg in sodium chloride 0.9 % 250 mL IVPB (has no administration in time range)  sodium chloride 0.9 % bolus 1,000 mL (has no administration in time range)  sodium chloride 0.9 % bolus 500 mL (has no administration in time range)  acetaminophen (TYLENOL) tablet 650 mg (650 mg Oral Given 02/01/20 1453)    ED Course  I have reviewed the triage vital signs and the nursing notes.  Pertinent labs & imaging results that were available during my care of the patient were reviewed by me and considered in my medical decision making (see chart for details).    MDM Rules/Calculators/A&P                          DEAUNDRE ALLSTON was evaluated in Emergency Department on 02/01/2020 for the symptoms described in the history of present illness. He was evaluated in the context of the global COVID-19 pandemic, which necessitated consideration that the patient might be at risk for infection with the SARS-CoV-2 virus that causes COVID-19. Institutional protocols and algorithms that pertain to the evaluation of patients at risk for COVID-19 are in a state of rapid change based on information released by regulatory bodies including the CDC and federal and state organizations. These policies and algorithms were followed during the patient's care in the ED.   Additional history obtained from: 1. Nursing notes from this  visit. 2. EMR reviewed, patient was seen in the urgent care yesterday.  Diagnosis of closed fracture of the distal end of the right ulna.  Was referred to orthopedic surgery.  I have personally reviewed patient's right wrist x-ray from yesterday and agree with radiologist interpretation. ------------------------------------------------- 79 year old male presented for generalized weakness for the past 2-3 days.  This appears to have preceded his fall 2 nights ago.  He denies any history of head injury.  He does have some pain with movement of the right forearm but otherwise denies any pain.  Specifically no chest pain, abdominal pain or dysuria.  He denies any nausea vomiting diarrhea or cough.  On examination he is ill-appearing but in no acute distress.  He is tachycardic tachypneic with sats in the low 90s on 2 L nasal cannula.  He has rhonchi present to the right lower lobe on exam, coarse lung sounds elsewhere.  Abdomen is soft and nontender.  I took down his right arm splint to assess for evaluation he reported, there is a small abrasion but no evidence of cellulitis.  He is neurovascular intact distally and no evidence of compartment syndrome at this time.  Code sepsis initiated, high concern for respiratory etiology and is azithromycin/Rocephin was ordered.  Blood pressure stable on initial examination, 1 L normal saline ordered.  Patient also reports that he has received both of his Moderna COVID-19 vaccinations last month. ----------------------------------------------------------------- CBC shows leukocytosis of 18.8 suspect secondary to sepsis/pneumonia.  Hemoglobin of 12.6 appears baseline.  CXR:  IMPRESSION:  Progression of right lower lobe airspace disease, probable pneumonia   EKG: Sinus or ectopic atrial tachycardia Left axis  deviation Abnormal R-wave progression, early transition Repol abnrm suggests ischemia, diffuse leads No STEMI Confirmed by Octaviano Glow [phone]) on 02/01/2020  2:04:04 PM Also confirmed by Octaviano Glow [phone]), editor Hattie Perch [phone])  on 02/01/2020 2:05:36 PM  Patient reassessed resting comfortably no acute distress orthopedic tech at bedside replacing splint. ------------ Reviewed labs.  Troponin XIX suspect slightly elevated from demand will obtain delta troponin.  He is chest pain-free.  PT/INR and APTT slightly elevated.  CMP shows no other acute electrolyte derangement, AKI or gap.  Lactic within normal limits.  Urinalysis without evidence of infection. - Plan of care is admission to the hospital at this time for community-acquired pneumonia.  Question aspiration pneumonia however will defer escalation of antibiotics to hospitalist.  Discussed case with Dr. Guido Sander, patient accepted to hospitalist service.    Note: Portions of this report may have been transcribed using voice recognition software. Every effort was made to ensure accuracy; however, inadvertent computerized transcription errors may still be present. Final Clinical Impression(s) / ED Diagnoses Final diagnoses:  Sepsis without acute organ dysfunction, due to unspecified organism [facility])  Community acquired pneumonia of right lower lobe of lung    Rx / DC Orders ED Discharge Orders    None       Gari Crown 02/01/20 1558    Wyvonnia Dusky, MD 02/01/20 1743

## 2020-02-01 NOTE — H&P (Signed)
History and Physical    GARFIELD COINER VPX:[id number] DOB: 1940-08-17 DOA: 02/01/2020  PCP: Marton Redwood, MD Patient coming from: Home  Chief Complaint: Generalized weakness.   HPI: Richard Mitchell is a 79 y.o. male with medical history significant of peripheral arterial disease, subdural hematoma status post craniotomy, HTN, seizure disorder, chronic dysphagia on pured diet at home brought to the hospital for generalized weakness and hypotension at home.  Apparently patient had fallen couple of days ago fracturing his right wrist, was seen by [facility] and splint was placed.  Wife and patient tells me he has had poor appetite for several days with poor oral intake during this time he has been feeling more dizzy and unsteady on his feet causing him to fall. Wife tries to feed him.  Diet as much as possible but his p.o. intake has diminished with time.  Today when EMS arrived he was noted to be hypotensive and received IV fluids.  His heart rate was noted to be in 120s as well.  In the ER patient was noted to be in septic shock secondary to aspiration pneumonia.  Chest x-ray showed right lower lobe pneumonia.  Initially his blood pressure was in systolic 46E with heart rate in 110s but after fluid this improved with systolic blood pressure greater than 100 and heart rate mostly in 90s.  He was started on Rocephin and azithromycin and medical team was requested to admit the patient.  When I saw him at bedside patient was communicating appropriately but overall appeared very weak.  Also spoke with his wife over the phone at his request.  Wife tells me he has had fevers at home over the last couple of days and when they went to urgent care for his right wrist fracture he was noted to be mildly hypoxic as well.   Review of Systems: As per HPI otherwise 10 point review of systems negative.  Review of Systems Otherwise negative except as per HPI, including: General: Denies night sweats or  unintended weight loss. Resp: Denies hemoptysis Cardiac: Denies chest pain, palpitations, orthopnea, paroxysmal nocturnal dyspnea. GI: Denies abdominal pain, nausea, vomiting, diarrhea or constipation GU: Denies dysuria, frequency, hesitancy or incontinence MS: Denies muscle aches, joint pain or swelling Neuro: Denies headache, neurologic deficits (focal weakness, numbness, tingling), abnormal gait Psych: Denies anxiety, depression, SI/HI/AVH Skin: Denies new rashes or lesions ID: Denies sick contacts, exotic exposures, travel  Past Medical History:  Diagnosis Date  . Anemia   . Cancer ([place])    [place]  . Carotid artery occlusion   . Dysphasia S/P CVA (cerebrovascular accident)   . Gait disturbance 11/12/2012  . History of subdural hematoma (post traumatic) Approx 1998  . Hypertension   . Keratosis 11/12/2012   x2  . Onychomycosis 11/12/2012   extreme neglect x 10  . Osteoporosis   . PAD (peripheral artery disease) ([place]) 11/12/2012   possible due to diminished pedal pulses  . Seizure disorder [facility])     Past Surgical History:  Procedure Laterality Date  . CAROTID ENDARTERECTOMY Left 10-13-2009  . SUBDURAL HEMATOMA EVACUATION VIA CRANIOTOMY      SOCIAL HISTORY:  reports that he quit smoking about 32 years ago. He has never used smokeless tobacco. He reports that he does not drink alcohol and does not use drugs.  No Known Allergies  FAMILY HISTORY: Family History  Problem Relation Age of Onset  . Heart disease Mother        had pacemaker  .  Cancer Mother        Breast  . Heart disease Sister   . Cancer Sister        Pancreatic  . Deep vein thrombosis Sister   . Heart attack Sister   . COPD Father      Prior to Admission medications   Medication Sig Start Date End Date Taking? Authorizing Provider  acetaminophen (TYLENOL) 500 MG tablet Take 2 tablets (1,000 mg total) by mouth every 8 (eight) hours as needed. Patient taking differently: Take 1,000 mg by mouth  daily.  01/31/20  Yes Augusto Gamble B, NP  aspirin 81 MG tablet Take 81 mg by mouth daily.   Yes [provider]  carbamazepine (TEGRETOL) 200 MG tablet Take 400 mg by mouth 2 (two) times daily.    Yes [provider]  Carboxymethylcellul-Glycerin (CLEAR EYES FOR DRY EYES OP) Place 1 drop into both eyes 2 (two) times daily.   Yes [provider]  Vitamin D, Ergocalciferol, (DRISDOL) 50000 UNITS CAPS Take 50,000 Units by mouth every 7 (seven) days.   Yes [provider]  rosuvastatin (CRESTOR) 20 MG tablet Take 20 mg by mouth daily.  Patient not taking: Reported on 02/01/2020 10/20/16   [provider]    Physical Exam: Vitals:   02/01/20 1359 02/01/20 1430 02/01/20 1500 02/01/20 1545  BP: 105/65 99/66 103/70 (!) 81/63  Pulse: (!) 116  (!) 112 (!) 109  Resp: (!) 26 (!) 26 18 21   Temp: (!) 100.9 F (38.3 C)     TempSrc: Rectal     SpO2: 100%  97% 96%  Weight: 68 kg     Height: 6\' 2"  (1.88 m)         Constitutional: Elderly cachetics frail, bilateral temporal wasting.  Eyes: PERRL, lids and conjunctivae normal ENMT: Mucous membranes are Dry.. Posterior pharynx clear of any exudate or lesions.Normal dentition.  Neck: normal, supple, no masses, no thyromegaly Respiratory: Right basilar rhonchi. Cardiovascular: Regular rate and rhythm, no murmurs / rubs / gallops. No extremity edema. 2+ pedal pulses. No carotid bruits.  Abdomen: no tenderness, no masses palpated. No hepatosplenomegaly. Bowel sounds positive.  Musculoskeletal: Right upper extremity splint in place Skin: no rashes, lesions, ulcers. No induration Neurologic: CN 2-12 grossly intact. Sensation intact, DTR normal. Strength 4/5 in all 4.  Psychiatric: Poor judgment and insight. Alert and oriented x 3.     Labs on Admission: I have personally reviewed following labs and imaging studies  CBC: Recent Labs  Lab 02/01/20 1433  WBC 18.8*  NEUTROABS 16.2*  HGB 12.6*  HCT 39.8    MCV 92.8  PLT 034   Basic Metabolic Panel: Recent Labs  Lab 02/01/20 1433  NA 136  K 3.6  CL 99  CO2 25  GLUCOSE 119*  BUN 11  CREATININE 0.97  CALCIUM 8.9   GFR: Estimated Creatinine Clearance: 57.4 mL/min (by C-G formula based on SCr of 0.97 mg/dL). Liver Function Tests: Recent Labs  Lab 02/01/20 1433  AST 69*  ALT 40  ALKPHOS 216*  BILITOT 0.9  PROT 7.4  ALBUMIN 3.2*   No results for input(s): LIPASE, AMYLASE in the last 168 hours. No results for input(s): AMMONIA in the last 168 hours. Coagulation Profile: Recent Labs  Lab 02/01/20 1433  INR 1.5*   Cardiac Enzymes: No results for input(s): CKTOTAL, CKMB, CKMBINDEX, TROPONINI in the last 168 hours. BNP (last 3 results) No results for input(s): PROBNP in the last 8760 hours. HbA1C: No results  for input(s): HGBA1C in the last 72 hours. CBG: No results for input(s): GLUCAP in the last 168 hours. Lipid Profile: No results for input(s): CHOL, HDL, LDLCALC, TRIG, CHOLHDL, LDLDIRECT in the last 72 hours. Thyroid Function Tests: No results for input(s): TSH, T4TOTAL, FREET4, T3FREE, THYROIDAB in the last 72 hours. Anemia Panel: No results for input(s): VITAMINB12, FOLATE, FERRITIN, TIBC, IRON, RETICCTPCT in the last 72 hours. Urine analysis:    Component Value Date/Time   COLORURINE AMBER (A) 02/01/2020 1400   APPEARANCEUR CLEAR 02/01/2020 1400   LABSPEC 1.020 02/01/2020 1400   PHURINE 5.0 02/01/2020 1400   GLUCOSEU NEGATIVE 02/01/2020 1400   HGBUR NEGATIVE 02/01/2020 1400   BILIRUBINUR NEGATIVE 02/01/2020 1400   KETONESUR 5 (A) 02/01/2020 1400   PROTEINUR 30 (A) 02/01/2020 1400   UROBILINOGEN 1.0 10/12/2009 1207   NITRITE NEGATIVE 02/01/2020 1400   LEUKOCYTESUR NEGATIVE 02/01/2020 1400   Sepsis Labs: !!!!!!!!!!!!!!!!!!!!!!!!!!!!!!!!!!!!!!!!!!!! @LABRCNTIP (procalcitonin:4,lacticidven:4) ) Recent Results (from the past 240 hour(s))  SARS Coronavirus 2 by RT PCR (hospital order, performed in [place] hospital lab) Nasopharyngeal Nasopharyngeal Swab     Status: None   Collection Time: 02/01/20  2:37 PM   Specimen: Nasopharyngeal Swab  Result Value Ref Range Status   SARS Coronavirus 2 NEGATIVE NEGATIVE Final    Comment: (NOTE) SARS-CoV-2 target nucleic acids are NOT DETECTED.  The SARS-CoV-2 RNA is generally detectable in upper and lower respiratory specimens during the acute phase of infection. The lowest concentration of SARS-CoV-2 viral copies this assay can detect is 250 copies / mL. A negative result does not preclude SARS-CoV-2 infection and should not be used as the sole basis for treatment or other patient management decisions.  A negative result may occur with improper specimen collection / handling, submission of specimen other than nasopharyngeal swab, presence of viral mutation(s) within the areas targeted by this assay, and inadequate number of viral copies (<250 copies / mL). A negative result must be combined with clinical observations, patient history, and epidemiological information.  Fact Sheet for Patients:   StrictlyIdeas.no  Fact Sheet for Healthcare Providers: BankingDealers.co.za  This test is not yet approved or  cleared by the [place] FDA and has been authorized for detection and/or diagnosis of SARS-CoV-2 by FDA under an Emergency Use Authorization (EUA).  This EUA will remain in effect (meaning this test can be used) for the duration of the COVID-19 declaration under Section 564(b)(1) of the Act, 21 U.S.C. section 360bbb-3(b)(1), unless the authorization is terminated or revoked sooner.  Performed at [facility], [place] [address] [place], [place] [address]      Radiological Exams on Admission: DG Wrist Complete Right  Result Date: 01/31/2020 CLINICAL DATA:  RIGHT wrist pain after catching a fall in the bathroom, constant pain with swelling, limited range of motion EXAM: RIGHT WRIST  - COMPLETE 3+ VIEW COMPARISON:  None FINDINGS: Osseous demineralization. Joint space narrowing at STT joint and third MCP joint. Displaced fracture of the distal RIGHT ulnar metadiaphysis. No additional fracture, dislocation, or bone destruction. Small vessel vascular calcifications. IMPRESSION: Displaced RIGHT ulnar metadiaphyseal fracture. Osseous demineralization with scattered degenerative changes. Electronically Signed   By: Lavonia Dana M.D.   On: 01/31/2020 13:47   DG Chest Port 1 View  Result Date: 02/01/2020 CLINICAL DATA:  Sepsis, hypoxia EXAM: PORTABLE CHEST 1 VIEW COMPARISON:  10/05/2019 FINDINGS: Progression of right lower lobe airspace disease which could be pneumonia. Mild left lower lobe airspace disease unchanged. COPD. Negative for heart failure. Probable  small bilateral pleural effusions. IMPRESSION: Progression of right lower lobe airspace disease, probable pneumonia Electronically Signed   By: Franchot Gallo M.D.   On: 02/01/2020 14:17     All images have been reviewed by me personally.    Assessment/Plan Principal Problem:   Sepsis ([place]) Active Problems:   PAD (peripheral artery disease) ([place])   Essential hypertension   Aspiration pneumonia ([place])   DNR (do not resuscitate)   Adult failure to thrive   Paroxysmal A-fib ([facility])   Atrial flutter ([facility])    Septic shock secondary to aspiration pneumonia, right lower lobe Chronic dysphagia with aspiration -Shock physiology has improved with IV fluids, initially systolic blood pressure less than 80, heart rate greater than 110, fever, leukocytosis, shortness of breath.  Sepsis protocol has been initiated, cultures have been drawn. -Gentle hydration, dysphagia 3 diet -Change antibiotics to Zosyn and azithromycin, de-escalate within next 24-48 hours depending on his improvement -Bronchodilators, aspiration precaution, incentive spirometer and flutter valve  Generalized progressive weakness -Deconditioning over time and also  poor oral intake -PT OT once his sepsis physiology has improved  History of seizure -Continue carbamazepine 400 mg twice daily  Right wrist fracture -Continue vitamin D supplements weekly.  Wrist splint in place, pain control.  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  History of peripheral arterial disease -Aspirin and statin   Body mass index is 19.26 kg/m.        DVT prophylaxis: Hep SQ Code Status: DNR Family Communication: Spoke with his Wife [name]  Consults called: None Admission status: Due to Sepsis - Inaptient admission to Progressive care   Status is: Inpatient  Remains inpatient appropriate because:Hemodynamically unstable   Dispo: The patient is from: Home              Anticipated d/c is to: Home              Anticipated d/c date is: 2 days              Patient currently is not medically stable to d/c.       Time Spent: 65 minutes.  >50% of the time was devoted to discussing the patients care, assessment, plan and disposition with other care givers along with counseling the patient about the risks and benefits of treatment.    Gwendoline Judy Arsenio Loader MD [facility]  If 7PM-7AM, please contact night-coverage   02/01/2020, 4:24 PM

## 2020-02-01 NOTE — Progress Notes (Signed)
Pharmacy Antibiotic Note  Richard Mitchell is a 79 y.o. male admitted on 02/01/2020 with pneumonia.  Pharmacy has been consulted for Zosyn dosing.   Height: 6\' 2"  (188 cm) Weight: 68 kg (150 lb) IBW/kg (Calculated) : 82.2  Temp (24hrs), Avg:100.9 F (38.3 C), Min:100.9 F (38.3 C), Max:100.9 F (38.3 C)  Recent Labs  Lab 02/01/20 1400 02/01/20 1433  WBC  --  18.8*  CREATININE  --  0.97  LATICACIDVEN 1.8  --     Estimated Creatinine Clearance: 57.4 mL/min (by C-G formula based on SCr of 0.97 mg/dL).    No Known Allergies  Antimicrobials this admission: 7/26 Zosyn >>    Dose adjustments this admission: N/a  Microbiology results: Pending   Plan:  - Zosyn 3.375g IV x 1 dose over 30 min followed by Zosyn 3.375g IV every 8 hours infused over 4 hours  - Monitor patients renal function and urine output  - De-escalate ABX when appropriate   Thank you for allowing pharmacy to be a part of this patient's care.  Duanne Limerick PharmD. BCPS 02/01/2020 4:34 PM

## 2020-02-01 NOTE — ED Notes (Signed)
Please contact Bilal Manzer (pt wife) at [phone], she would like to speak to pt

## 2020-02-01 NOTE — ED Triage Notes (Signed)
Pt BIB [facility] for eval of weakness x 2-3 days. Here yesterday for fall w/ R FA fx, splint in place. Family reports weakness progressing and worsening and unable to care for pt at home d/t weakness and new fx. Pt hypotensive w/ EMS to 90, rec'd 100 cc NS through #20G in L hand. Afib w/ rate of 120s w/ hx of same.

## 2020-02-01 NOTE — ED Notes (Signed)
Patient speaking with wife via telephone at this time.

## 2020-02-02 ENCOUNTER — Encounter (HOSPITAL_COMMUNITY): Payer: Self-pay | Admitting: Internal Medicine

## 2020-02-02 ENCOUNTER — Other Ambulatory Visit: Payer: Self-pay

## 2020-02-02 LAB — BASIC METABOLIC PANEL
Anion gap: 7 (ref 5–15)
BUN: 7 mg/dL — ABNORMAL LOW (ref 8–23)
CO2: 24 mmol/L (ref 22–32)
Calcium: 7.6 mg/dL — ABNORMAL LOW (ref 8.9–10.3)
Chloride: 109 mmol/L (ref 98–111)
Creatinine, Ser: 0.72 mg/dL (ref 0.61–1.24)
GFR calc Af Amer: >60 mL/min (ref >60)
GFR calc non Af Amer: >60 mL/min (ref >60)
Glucose, Bld: 108 mg/dL — ABNORMAL HIGH (ref 70–99)
Potassium: 3.2 mmol/L — ABNORMAL LOW (ref 3.5–5.1)
Sodium: 140 mmol/L (ref 135–145)

## 2020-02-02 LAB — CBC
HCT: 29.8 % — ABNORMAL LOW (ref 39.0–52.0)
Hemoglobin: 9.4 g/dL — ABNORMAL LOW (ref 13.0–17.0)
MCH: 29 pg (ref 26.0–34.0)
MCHC: 31.5 g/dL (ref 30.0–36.0)
MCV: 92 fL (ref 80.0–100.0)
Platelets: 207 10*3/uL (ref 150–400)
RBC: 3.24 MIL/uL — ABNORMAL LOW (ref 4.22–5.81)
RDW: 13.3 % (ref 11.5–15.5)
WBC: 12.8 10*3/uL — ABNORMAL HIGH (ref 4.0–10.5)
nRBC: 0 % (ref 0.0–0.2)

## 2020-02-02 LAB — URINE CULTURE: Culture: NO GROWTH

## 2020-02-02 LAB — STREP PNEUMONIAE URINARY ANTIGEN: Strep Pneumo Urinary Antigen: NEGATIVE

## 2020-02-02 LAB — GLUCOSE, CAPILLARY
Glucose-Capillary: 93 mg/dL (ref 70–99)
Glucose-Capillary: 136 mg/dL — ABNORMAL HIGH (ref 70–99)
Glucose-Capillary: 95 mg/dL (ref 70–99)

## 2020-02-02 LAB — MAGNESIUM: Magnesium: 2 mg/dL (ref 1.7–2.4)

## 2020-02-02 MED ORDER — POTASSIUM CHLORIDE CRYS ER 20 MEQ PO TBCR
40.0000 meq | EXTENDED_RELEASE_TABLET | ORAL | Status: AC
  Administered 2020-02-02 (×2): 40 meq via ORAL
  Filled 2020-02-02 (×2): qty 2

## 2020-02-02 MED ORDER — ENSURE ENLIVE PO LIQD
237.0000 mL | Freq: Two times a day (BID) | ORAL | Status: DC
  Administered 2020-02-02 – 2020-02-03 (×2): 237 mL via ORAL

## 2020-02-02 NOTE — Consult Note (Signed)
Reason for Consult: Right wrist fracture Referring Physician: ER staff  Richard Mitchell is an 79 y.o. male.  HPI: 79 year old male with history of multiple medical problems who has a acute fracture of his left wrist.  He has a mildly displaced right ulna fracture.  He is in a splint.  He has swelling in his hand appropriate to his injury.  His elbow and upper arm are stable.  He is sensate.  He is here at bedside with his wife.  He was planning to follow-up at our office but was admitted to the hospital.  I was notified of his admission.  I have reviewed all findings at length.  He is at bedside and alert and oriented.  He is here with his wife.  They are very nice people.  Past Medical History:  Diagnosis Date  . Anemia   . Cancer ([place])    [place]  . Carotid artery occlusion   . Dysphasia S/P CVA (cerebrovascular accident)   . Gait disturbance 11/12/2012  . History of subdural hematoma (post traumatic) Approx 1998  . Hypertension   . Keratosis 11/12/2012   x2  . Onychomycosis 11/12/2012   extreme neglect x 10  . Osteoporosis   . PAD (peripheral artery disease) ([place]) 11/12/2012   possible due to diminished pedal pulses  . Seizure disorder [facility])     Past Surgical History:  Procedure Laterality Date  . CAROTID ENDARTERECTOMY Left 10-13-2009  . SUBDURAL HEMATOMA EVACUATION VIA CRANIOTOMY      Family History  Problem Relation Age of Onset  . Heart disease Mother        had pacemaker  . Cancer Mother        Breast  . Heart disease Sister   . Cancer Sister        Pancreatic  . Deep vein thrombosis Sister   . Heart attack Sister   . COPD Father     Social History:  reports that he quit smoking about 32 years ago. He has never used smokeless tobacco. He reports that he does not drink alcohol and does not use drugs.  Allergies: Reviewed in detail  Results for orders placed or performed during the hospital encounter of 02/01/20 (from the past 48 hour(s))  Lactic acid, plasma      Status: None   Collection Time: 02/01/20  2:00 PM  Result Value Ref Range   Lactic Acid, Venous 1.8 0.5 - 1.9 mmol/L    Comment: Performed at [facility], [address], [place], [place] [address]  Urinalysis, Routine w reflex microscopic     Status: Abnormal   Collection Time: 02/01/20  2:00 PM  Result Value Ref Range   Color, Urine AMBER (A) YELLOW    Comment: BIOCHEMICALS MAY BE AFFECTED BY COLOR   APPearance CLEAR CLEAR   Specific Gravity, Urine 1.020 1.005 - 1.030   pH 5.0 5.0 - 8.0   Glucose, UA NEGATIVE NEGATIVE mg/dL   Hgb urine dipstick NEGATIVE NEGATIVE   Bilirubin Urine NEGATIVE NEGATIVE   Ketones, ur 5 (A) NEGATIVE mg/dL   Protein, ur 30 (A) NEGATIVE mg/dL   Nitrite NEGATIVE NEGATIVE   Leukocytes,Ua NEGATIVE NEGATIVE   RBC / HPF 0-5 0 - 5 RBC/hpf   WBC, UA 0-5 0 - 5 WBC/hpf   Bacteria, UA NONE SEEN NONE SEEN   Squamous Epithelial / LPF 0-5 0 - 5   Mucus PRESENT     Comment: Performed at [facility], [address] [place], [place] [address]  Strep pneumoniae urinary antigen     Status: None   Collection Time: 02/01/20  2:00 PM  Result Value Ref Range   Strep Pneumo Urinary Antigen NEGATIVE NEGATIVE    Comment:        Infection due to S. pneumoniae cannot be absolutely ruled out since the antigen present may be below the detection limit of the test. Performed at [facility], [address], [place], [place] [address]   Blood Culture (routine x 2)     Status: None (Preliminary result)   Collection Time: 02/01/20  2:24 PM   Specimen: BLOOD LEFT HAND  Result Value Ref Range   Specimen Description BLOOD LEFT HAND    Special Requests      BOTTLES DRAWN AEROBIC AND ANAEROBIC Blood Culture adequate volume   Culture      NO GROWTH < 24 HOURS Performed at [facility], [place] [address] [place], [place] [address]    Report Status PENDING   Blood Culture (routine x 2)     Status: None (Preliminary result)   Collection Time: 02/01/20   2:32 PM   Specimen: BLOOD  Result Value Ref Range   Specimen Description BLOOD LEFT ANTECUBITAL    Special Requests      BOTTLES DRAWN AEROBIC AND ANAEROBIC Blood Culture adequate volume   Culture      NO GROWTH < 24 HOURS Performed at [facility], [place] [address] [place], [place] [address]    Report Status PENDING   Comprehensive metabolic panel     Status: Abnormal   Collection Time: 02/01/20  2:33 PM  Result Value Ref Range   Sodium 136 135 - 145 mmol/L   Potassium 3.6 3.5 - 5.1 mmol/L   Chloride 99 98 - 111 mmol/L   CO2 25 22 - 32 mmol/L   Glucose, Bld 119 (H) 70 - 99 mg/dL    Comment: Glucose reference range applies only to samples taken after fasting for at least 8 hours.   BUN 11 8 - 23 mg/dL   Creatinine, Ser 0.97 0.61 - 1.24 mg/dL   Calcium 8.9 8.9 - 10.3 mg/dL   Total Protein 7.4 6.5 - 8.1 g/dL   Albumin 3.2 (L) 3.5 - 5.0 g/dL   AST 69 (H) 15 - 41 U/L   ALT 40 0 - 44 U/L   Alkaline Phosphatase 216 (H) 38 - 126 U/L   Total Bilirubin 0.9 0.3 - 1.2 mg/dL   GFR calc non Af Amer >60 >60 mL/min   GFR calc Af Amer >60 >60 mL/min   Anion gap 12 5 - 15    Comment: Performed at [facility] [address], [place], [place] [address]  CBC WITH DIFFERENTIAL     Status: Abnormal   Collection Time: 02/01/20  2:33 PM  Result Value Ref Range   WBC 18.8 (H) 4.0 - 10.5 K/uL   RBC 4.29 4.22 - 5.81 MIL/uL   Hemoglobin 12.6 (L) 13.0 - 17.0 g/dL   HCT 39.8 39 - 52 %   MCV 92.8 80.0 - 100.0 fL   MCH 29.4 26.0 - 34.0 pg   MCHC 31.7 30.0 - 36.0 g/dL   RDW 13.5 11.5 - 15.5 %   Platelets 268 150 - 400 K/uL   nRBC 0.0 0.0 - 0.2 %   Neutrophils Relative % 86 %   Neutro Abs 16.2 (H) 1.7 - 7.7 K/uL  Lymphocytes Relative 4 %   Lymphs Abs 0.7 0.7 - 4.0 K/uL   Monocytes Relative 9 %   Monocytes Absolute 1.8 (H) 0 - 1 K/uL   Eosinophils Relative 0 %   Eosinophils Absolute 0.0 0 - 0 K/uL   Basophils Relative 0 %   Basophils Absolute 0.0 0 - 0 K/uL   Immature Granulocytes  1 %   Abs Immature Granulocytes 0.18 (H) 0.00 - 0.07 K/uL    Comment: Performed at [facility] [address], [place], [place] [address]  APTT     Status: Abnormal   Collection Time: 02/01/20  2:33 PM  Result Value Ref Range   aPTT 37 (H) 24 - 36 seconds    Comment:        IF BASELINE aPTT IS ELEVATED, SUGGEST PATIENT RISK ASSESSMENT BE USED TO DETERMINE APPROPRIATE ANTICOAGULANT THERAPY. Performed at [facility], [place] [address], [place], [place] [address]   Protime-INR     Status: Abnormal   Collection Time: 02/01/20  2:33 PM  Result Value Ref Range   Prothrombin Time 17.4 (H) 11.4 - 15.2 seconds   INR 1.5 (H) 0.8 - 1.2    Comment: (NOTE) INR goal varies based on device and disease states. Performed at [facility], [place] [address], [place], [place] [address]   Troponin I (High Sensitivity)     Status: Abnormal   Collection Time: 02/01/20  2:33 PM  Result Value Ref Range   Troponin I (High Sensitivity) 19 (H) <18 ng/L    Comment: (NOTE) Elevated high sensitivity troponin I (hsTnI) values and significant  changes across serial measurements may suggest ACS but many other  chronic and acute conditions are known to elevate hsTnI results.  Refer to the "Links" section for chest pain algorithms and additional  guidance. Performed at [facility], [place] [address], [place], [place] [address]   SARS Coronavirus 2 by RT PCR (hospital order, performed in [facility] hospital lab) Nasopharyngeal Nasopharyngeal Swab     Status: None   Collection Time: 02/01/20  2:37 PM   Specimen: Nasopharyngeal Swab  Result Value Ref Range   SARS Coronavirus 2 NEGATIVE NEGATIVE    Comment: (NOTE) SARS-CoV-2 target nucleic acids are NOT DETECTED.  The SARS-CoV-2 RNA is generally detectable in upper and lower respiratory specimens during the acute phase of infection. The lowest concentration of SARS-CoV-2 viral copies this assay can detect is 250 copies / mL. A negative  result does not preclude SARS-CoV-2 infection and should not be used as the sole basis for treatment or other patient management decisions.  A negative result may occur with improper specimen collection / handling, submission of specimen other than nasopharyngeal swab, presence of viral mutation(s) within the areas targeted by this assay, and inadequate number of viral copies (<250 copies / mL). A negative result must be combined with clinical observations, patient history, and epidemiological information.  Fact Sheet for Patients:   StrictlyIdeas.no  Fact Sheet for Healthcare Providers: BankingDealers.co.za  This test is not yet approved or  cleared by the [place] FDA and has been authorized for detection and/or diagnosis of SARS-CoV-2 by FDA under an Emergency Use Authorization (EUA).  This EUA will remain in effect (meaning this test can be used) for the duration of the COVID-19 declaration under Section 564(b)(1) of the Act, 21 U.S.C. section 360bbb-3(b)(1), unless the authorization is terminated or revoked sooner.  Performed at [facility], [place]  [address], [place], [place] [address]   Urine culture     Status: None   Collection Time: 02/01/20  2:38 PM   Specimen: In/Out Cath Urine  Result Value Ref Range   Specimen Description IN/OUT CATH URINE    Special Requests NONE    Culture      NO GROWTH Performed at [facility], [place] [address] [place], [place] [address]    Report Status 02/02/2020 FINAL   Troponin I (High Sensitivity)     Status: Abnormal   Collection Time: 02/01/20  4:14 PM  Result Value Ref Range   Troponin I (High Sensitivity) 20 (H) <18 ng/L    Comment: (NOTE) Elevated high sensitivity troponin I (hsTnI) values and significant  changes across serial measurements may suggest ACS but many other  chronic and acute conditions are known to elevate hsTnI results.  Refer to the "Links" section  for chest pain algorithms and additional  guidance. Performed at [facility], [place] [address], [place], [place] [address]   CBC     Status: Abnormal   Collection Time: 02/01/20  4:14 PM  Result Value Ref Range   WBC 17.3 (H) 4.0 - 10.5 K/uL   RBC 3.34 (L) 4.22 - 5.81 MIL/uL   Hemoglobin 10.0 (L) 13.0 - 17.0 g/dL   HCT 30.6 (L) 39 - 52 %   MCV 91.6 80.0 - 100.0 fL   MCH 29.9 26.0 - 34.0 pg   MCHC 32.7 30.0 - 36.0 g/dL   RDW 13.5 11.5 - 15.5 %   Platelets 242 150 - 400 K/uL   nRBC 0.0 0.0 - 0.2 %    Comment: Performed at [facility], [place] [address] [place], [place] [address]  Creatinine, serum     Status: None   Collection Time: 02/01/20  4:14 PM  Result Value Ref Range   Creatinine, Ser 0.84 0.61 - 1.24 mg/dL   GFR calc non Af Amer >60 >60 mL/min   GFR calc Af Amer >60 >60 mL/min    Comment: Performed at [facility] [address], [place], [place] [address]  CBG monitoring, ED     Status: Abnormal   Collection Time: 02/01/20  4:42 PM  Result Value Ref Range   Glucose-Capillary 116 (H) 70 - 99 mg/dL    Comment: Glucose reference range applies only to samples taken after fasting for at least 8 hours.  Lactic acid, plasma     Status: None   Collection Time: 02/01/20  5:00 PM  Result Value Ref Range   Lactic Acid, Venous 1.0 0.5 - 1.9 mmol/L    Comment: Performed at [facility] [address], [place], [place] [address]  Procalcitonin - Baseline     Status: None   Collection Time: 02/01/20  5:00 PM  Result Value Ref Range   Procalcitonin 0.28 ng/mL    Comment:        Interpretation: PCT (Procalcitonin) <= 0.5 ng/mL: Systemic infection (sepsis) is not likely. Local bacterial infection is possible. (NOTE)       Sepsis PCT Algorithm           Lower Respiratory Tract                                      Infection PCT Algorithm    ----------------------------     ----------------------------  PCT < 0.25 ng/mL                PCT < 0.10  ng/mL          Strongly encourage             Strongly discourage   discontinuation of antibiotics    initiation of antibiotics    ----------------------------     -----------------------------       PCT 0.25 - 0.50 ng/mL            PCT 0.10 - 0.25 ng/mL               OR       >80% decrease in PCT            Discourage initiation of                                            antibiotics      Encourage discontinuation           of antibiotics    ----------------------------     -----------------------------         PCT >= 0.50 ng/mL              PCT 0.26 - 0.50 ng/mL               AND        <80% decrease in PCT             Encourage initiation of                                             antibiotics       Encourage continuation           of antibiotics    ----------------------------     -----------------------------        PCT >= 0.50 ng/mL                  PCT > 0.50 ng/mL               AND         increase in PCT                  Strongly encourage                                      initiation of antibiotics    Strongly encourage escalation           of antibiotics                                     -----------------------------                                           PCT <= 0.25 ng/mL  OR                                        > 80% decrease in PCT                                      Discontinue / Do not initiate                                             antibiotics  Performed at [facility], [place] [address], [place], [place] [address]   Brain natriuretic peptide     Status: Abnormal   Collection Time: 02/01/20  5:00 PM  Result Value Ref Range   B Natriuretic Peptide 420.0 (H) 0.0 - 100.0 pg/mL    Comment: Performed at [facility] [address] [place], [place] [address]  TSH     Status: None   Collection Time: 02/01/20  5:00 PM  Result Value Ref Range   TSH 1.176 0.350 - 4.500 uIU/mL    Comment:  Performed by a 3rd Generation assay with a functional sensitivity of <=0.01 uIU/mL. Performed at [facility], [place] [address], [place], [place] [address]   CBG monitoring, ED     Status: None   Collection Time: 02/01/20 10:34 PM  Result Value Ref Range   Glucose-Capillary 91 70 - 99 mg/dL    Comment: Glucose reference range applies only to samples taken after fasting for at least 8 hours.  Basic metabolic panel     Status: Abnormal   Collection Time: 02/02/20  4:41 AM  Result Value Ref Range   Sodium 140 135 - 145 mmol/L   Potassium 3.2 (L) 3.5 - 5.1 mmol/L   Chloride 109 98 - 111 mmol/L   CO2 24 22 - 32 mmol/L   Glucose, Bld 108 (H) 70 - 99 mg/dL    Comment: Glucose reference range applies only to samples taken after fasting for at least 8 hours.   BUN 7 (L) 8 - 23 mg/dL   Creatinine, Ser 0.72 0.61 - 1.24 mg/dL   Calcium 7.6 (L) 8.9 - 10.3 mg/dL   GFR calc non Af Amer >60 >60 mL/min   GFR calc Af Amer >60 >60 mL/min   Anion gap 7 5 - 15    Comment: Performed at [facility] [address] [place], [place] [address]  CBC     Status: Abnormal   Collection Time: 02/02/20  4:41 AM  Result Value Ref Range   WBC 12.8 (H) 4.0 - 10.5 K/uL   RBC 3.24 (L) 4.22 - 5.81 MIL/uL   Hemoglobin 9.4 (L) 13.0 - 17.0 g/dL   HCT 29.8 (L) 39 - 52 %   MCV 92.0 80.0 - 100.0 fL   MCH 29.0 26.0 - 34.0 pg   MCHC 31.5 30.0 - 36.0 g/dL   RDW 13.3 11.5 - 15.5 %   Platelets 207 150 - 400 K/uL   nRBC 0.0 0.0 - 0.2 %    Comment: Performed at [facility], [place] [address], [place], [place] [address]  Magnesium     Status: None   Collection Time: 02/02/20  4:41 AM  Result Value Ref Range   Magnesium 2.0 1.7 - 2.4 mg/dL    Comment: Performed at [facility], [place] [address] [place], [place] [address]  Glucose, capillary     Status: None   Collection Time: 02/02/20  7:41 AM  Result Value Ref Range   Glucose-Capillary 93 70 - 99 mg/dL    Comment: Glucose reference range applies only  to samples taken after fasting for at least 8 hours.  Glucose, capillary     Status: Abnormal   Collection Time: 02/02/20  2:05 PM  Result Value Ref Range   Glucose-Capillary 136 (H) 70 - 99 mg/dL    Comment: Glucose reference range applies only to samples taken after fasting for at least 8 hours.  Glucose, capillary     Status: None   Collection Time: 02/02/20  4:53 PM  Result Value Ref Range   Glucose-Capillary 95 70 - 99 mg/dL    Comment: Glucose reference range applies only to samples taken after fasting for at least 8 hours.    DG Chest Port 1 View  Result Date: 02/01/2020 CLINICAL DATA:  Sepsis, hypoxia EXAM: PORTABLE CHEST 1 VIEW COMPARISON:  10/05/2019 FINDINGS: Progression of right lower lobe airspace disease which could be pneumonia. Mild left lower lobe airspace disease unchanged. COPD. Negative for heart failure. Probable small bilateral pleural effusions. IMPRESSION: Progression of right lower lobe airspace disease, probable pneumonia Electronically Signed   By: Franchot Gallo M.D.   On: 02/01/2020 14:17    Review of Systems Blood pressure 123/65, pulse 63, temperature 98.7 F (37.1 C), temperature source Oral, resp. rate 18, height 6\' 2"  (1.88 m), weight 73.4 kg, SpO2 99 %. Physical Exam patient has swelling and ecchymosis appropriate to his injury.  He has no signs of instability or infection.  There is no compartment syndrome.  He is sensate to the hand.  He has intact refill throughout.  Elbow and upper arm are stable.  His opposite left upper extremity is stable.  I reviewed his x-rays which do show a fracture to the lower and of the wrist.  He has a mildly displaced wrist fracture about the ulna.  There is no evidence of dystrophy or infection.  I reviewed his examination at length.  He is alert and oriented.  Lower extremity examination is stable.  Pelvis is stable and his abdomen is nontender.  I have reviewed this with him at length.  Assessment/Plan: Acutely  displaced left distal ulna fracture.  I discussed all issues with he and his wife.  I would recommend closed treatment in a conservative fashion.  To this and I have performed a sugar tong cast placement which he tolerated well.  We will follow up with additional x-rays and office follow-up.  I would expect 8 to 10-week healing phase and would recommend a conservative course of care as outlined to he and his wife today.  We had a long discussion regarding cast care and other issues germane to the predicament.  All questions have been encouraged and answered.  Satira Anis Octaviano Mukai III 02/02/2020, 9:27 PM

## 2020-02-02 NOTE — Evaluation (Signed)
Clinical/Bedside Swallow Evaluation Patient Details  Name: Richard Mitchell MRN: [id number] Date of Birth: Oct 28, 1940  Today's Date: 02/02/2020 Time: SLP Start Time (ACUTE ONLY): 1104 SLP Stop Time (ACUTE ONLY): 1120 SLP Time Calculation (min) (ACUTE ONLY): 16 min  Past Medical History:  Past Medical History:  Diagnosis Date  . Anemia   . Cancer ([place])    [place]  . Carotid artery occlusion   . Dysphasia S/P CVA (cerebrovascular accident)   . Gait disturbance 11/12/2012  . History of subdural hematoma (post traumatic) Approx 1998  . Hypertension   . Keratosis 11/12/2012   x2  . Onychomycosis 11/12/2012   extreme neglect x 10  . Osteoporosis   . PAD (peripheral artery disease) ([place]) 11/12/2012   possible due to diminished pedal pulses  . Seizure disorder [facility])    Past Surgical History:  Past Surgical History:  Procedure Laterality Date  . CAROTID ENDARTERECTOMY Left 10-13-2009  . SUBDURAL HEMATOMA EVACUATION VIA CRANIOTOMY     HPI:  Pt is an 79 yo male admitted with sepsis, with concern for aspiration PNA. CXR showed RLL airspace disease. Pt had a fall PTA on 7/25 with wrist fx and had poor PO Intake at home since then. 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 In January 2021, pt/family opted to eat and drink, accepting aspiration risk. He had another OP MBS in May 2021 with ongoing aspiration, not improved with thicker liquids given residue. He was provided strategies to reduce but not eliminate aspiration risk: "assiting pt into a slight forward head position to approximate the base of tongue to the posterior pharyngeal wall, limiting bolus size with a controlled straw sip, encouraging f/u throat clearing/cough with multiple swallows." A diet of Dys 1-2 textures and thin liquids were recommended. PMH also includes: seizure disorder, PAD, osteoporosis, HTN, SDH, dysphagia   Assessment / Plan /  Recommendation Clinical Impression  Pt has a h/o severe pharyngeal dysphagia with heightened risk for aspiration regardless of diet textures. Today at baseline he has a wet vocal quality, concerning for decreased secretion management. He has coughing with thin liquids and purees, concerning for ongoing dysphagia that is at least comparable if not possibly acutely declined in light of current deconditioning. His baseline dysphagia is felt to be more chronic and structural in nature. These concerns were reviewed with the pt, who would like to accept the risk of aspiration as he and his family have opted to do in the past. He would like to return to a pureed diet, as he believes that this has been going better at home. SLP reinforced with pt that diet modifications and use of strategies would not eliminated aspiration - he acknowledged this. SLP also stressed the importance of oral hygiene. Pt's wishes were also reviewed with MD who is in agreement with continuing pt's home diet per his wishes with consideration of palliative care involvement given chronic dysphagia. Pt may still benefit from SLP f/u to reinforce strategies/education and initiate EMST to maximize cough for airway protection.   SLP Visit Diagnosis: Dysphagia, oropharyngeal phase (R13.12)    Aspiration Risk  Severe aspiration risk    Diet Recommendation Dysphagia 1 (Puree);Thin liquid (per pt preference, accepting risk of aspiration)   Liquid Administration via: Straw Medication Administration: Crushed with puree Supervision: Patient able to self feed;Intermittent supervision to cue for compensatory strategies Compensations: Slow rate;Small sips/bites;Follow solids with liquid;Multiple dry swallows after each bite/sip;Clear throat  after each swallow;Hard cough after swallow;Effortful swallow;Chin tuck;Use straw to facilitate chin tuck Postural Changes: Seated upright at 90 degrees;Remain upright for at least 30 minutes after po intake     Other  Recommendations Oral Care Recommendations: Oral care QID (more frequently given risk for aspiration) Other Recommendations: Have oral suction available   Follow up Recommendations Home health SLP;Outpatient SLP      Frequency and Duration min 2x/week  2 weeks       Prognosis Prognosis for Safe Diet Advancement: Guarded Barriers to Reach Goals: Time post onset;Severity of deficits      Swallow Study   General HPI: Pt is an 79 yo male admitted with sepsis, with concern for aspiration PNA. CXR showed RLL airspace disease. Pt had a fall PTA on 7/25 with wrist fx and had poor PO Intake at home since then. 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 In January 2021, pt/family opted to eat and drink, accepting aspiration risk. He had another OP MBS in May 2021 with ongoing aspiration, not improved with thicker liquids given residue. He was provided strategies to reduce but not eliminate aspiration risk: "assiting pt into a slight forward head position to approximate the base of tongue to the posterior pharyngeal wall, limiting bolus size with a controlled straw sip, encouraging f/u throat clearing/cough with multiple swallows." A diet of Dys 1-2 textures and thin liquids were recommended. PMH also includes: seizure disorder, PAD, osteoporosis, HTN, SDH, dysphagia Type of Study: Bedside Swallow Evaluation Previous Swallow Assessment: see HPI Diet Prior to this Study: Dysphagia 3 (soft);Thin liquids Temperature Spikes Noted: Yes (100.9) Respiratory Status: Nasal cannula History of Recent Intubation: No Behavior/Cognition: Alert;Cooperative;Pleasant mood Oral Cavity Assessment: Within Functional Limits Oral Care Completed by SLP: No Oral Cavity - Dentition: Missing dentition (missing almost all of his dentition) Vision: Functional for self-feeding Self-Feeding Abilities: Able to feed self Patient  Positioning: Upright in chair Baseline Vocal Quality: Wet Volitional Cough: Strong Volitional Swallow: Able to elicit    Oral/Motor/Sensory Function     Ice Chips Ice chips: Not tested   Thin Liquid Thin Liquid: Impaired Presentation: Straw Pharyngeal  Phase Impairments: Wet Vocal Quality;Cough - Immediate;Cough - Delayed    Nectar Thick Nectar Thick Liquid: Not tested   Honey Thick Honey Thick Liquid: Not tested   Puree Puree: Impaired Presentation: Self Fed;Spoon Pharyngeal Phase Impairments: Cough - Delayed   Solid     Solid: Not tested      Osie Bond., M.A. [place] Pager [phone] Office [phone]  02/02/2020,12:38 PM

## 2020-02-02 NOTE — Progress Notes (Signed)
PROGRESS NOTE    Richard Mitchell  [name] DOB: 1941/05/31 DOA: 02/01/2020 PCP: Marton Redwood, MD   Brief Narrative:  HPI: Richard Mitchell is a 79 y.o. male with medical history significant of peripheral arterial disease, subdural hematoma status post craniotomy, HTN, seizure disorder, chronic dysphagia on pured diet at home brought to the hospital for generalized weakness and hypotension at home.  Apparently patient had fallen couple of days ago fracturing his right wrist, was seen by [facility] and splint was placed.  Wife and patient tells me he has had poor appetite for several days with poor oral intake during this time he has been feeling more dizzy and unsteady on his feet causing him to fall. Wife tries to feed him.  Diet as much as possible but his p.o. intake has diminished with time.  Today when EMS arrived he was noted to be hypotensive and received IV fluids.  His heart rate was noted to be in 120s as well.  In the ER patient was noted to be in septic shock secondary to aspiration pneumonia.  Chest x-ray showed right lower lobe pneumonia.  Initially his blood pressure was in systolic 56L with heart rate in 110s but after fluid this improved with systolic blood pressure greater than 100 and heart rate mostly in 90s.  He was started on Rocephin and azithromycin and medical team was requested to admit the patient.  When I saw him at bedside patient was communicating appropriately but overall appeared very weak.  Also spoke with his wife over the phone at his request.  Wife tells me he has had fevers at home over the last couple of days and when they went to urgent care for his right wrist fracture he was noted to be mildly hypoxic as well.   Assessment & Plan:   Principal Problem:   Sepsis ([place]) Active Problems:   PAD (peripheral artery disease) ([place])   Essential hypertension   Aspiration pneumonia ([place])   DNR (do not resuscitate)   Adult failure to thrive    Paroxysmal A-fib ([facility])   Atrial flutter ([facility])  Sepsis and acute hypoxic respiratory failure secondary to right lower lobe aspiration pneumonia, POA:  Chronic dysphagia with aspiration.  Please note that patient did not meet sepsis shock criteria as his blood pressure improved with IV fluids only without requirement of any vasopressors.  Patient meets criteria for sepsis based on fever, leukocytosis and tachypnea.  Sepsis physiology has resolved.  Leukocytosis improved but is still slightly elevated.  He is afebrile.  Not tachypneic.  Still requiring 2 L of oxygen.  Continue Zosyn and azithromycin.  Can be deescalated to Rocephin and Zithromax within the next 24 hours.  Will check urine Legionella and streptococci.  SLP on board. Incentive spirometry.  Requested RN to provide equipment and educate patient and remind him to use every hour.  Generalized progressive weakness: Looks very weak.  Will consult PT OT.  History of seizure -Continue carbamazepine 400 mg twice daily  Right wrist fracture -Continue vitamin D supplements weekly.  Wrist splint in place, pain controlled.  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  History of peripheral arterial disease -Aspirin and statin  DVT prophylaxis: heparin injection 5,000 Units Start: 02/01/20 1630   Code Status: DNR  Family Communication:  None present at bedside.  Plan of care discussed with patient in length and he verbalized understanding and  agreed with it.  We will call his wife in afternoon today.  Status is: Inpatient  Remains inpatient appropriate because:Hemodynamically unstable   Dispo: The patient is from: Home              Anticipated d/c is to: TBD              Anticipated d/c date is: 2 days              Patient currently is not medically stable to d/c.        Estimated body mass index is 20.78 kg/m as  calculated from the following:   Height as of this encounter: 6\' 2"  (1.88 m).   Weight as of this encounter: 73.4 kg.      Nutritional status:               Consultants:   None  Procedures:   None  Antimicrobials:  Anti-infectives (From admission, onward)   Start     Dose/Rate Route Frequency Ordered Stop   02/02/20 2300  piperacillin-tazobactam (ZOSYN) IVPB 3.375 g     Discontinue    "Followed by" Linked Group Details   3.375 g 12.5 mL/hr over 240 Minutes Intravenous Every 8 hours 02/01/20 1634     02/01/20 1645  piperacillin-tazobactam (ZOSYN) IVPB 3.375 g       "Followed by" Linked Group Details   3.375 g 100 mL/hr over 30 Minutes Intravenous  Once 02/01/20 1634 02/01/20 1914   02/01/20 1400  cefTRIAXone (ROCEPHIN) 2 g in sodium chloride 0.9 % 100 mL IVPB  Status:  Discontinued        2 g 200 mL/hr over 30 Minutes Intravenous Every 24 hours 02/01/20 1359 02/01/20 1600   02/01/20 1400  azithromycin (ZITHROMAX) 500 mg in sodium chloride 0.9 % 250 mL IVPB     Discontinue     500 mg 250 mL/hr over 60 Minutes Intravenous Every 24 hours 02/01/20 1359           Subjective: Seen and examined.  Sitting in the chair.  Nurse present at the bedside.  Alert and oriented x3 but slow in response.  Feels better.  Still with some shortness of breath but better than yesterday.  No other complaint.  Objective: Vitals:   02/02/20 0340 02/02/20 0400 02/02/20 0740 02/02/20 0855  BP:  (!) 118/53 128/76   Pulse:   79   Resp:  18 20   Temp: 98.1 F (36.7 C)  99.1 F (37.3 C)   TempSrc: Oral  Oral   SpO2: 97% 99% (!) 86% 93%  Weight: 73.4 kg     Height: 6\' 2"  (1.88 m)       Intake/Output Summary (Last 24 hours) at 02/02/2020 1022 Last data filed at 02/02/2020 0900 Gross per 24 hour  Intake 2500 ml  Output --  Net 2500 ml   Filed Weights   02/01/20 1359 02/02/20 0340  Weight: 68 kg 73.4 kg    Examination:  General exam: Appears calm and comfortable, appears very  weak Respiratory system: Bibasilar rhonchi. Respiratory effort normal. Cardiovascular system: S1 & S2 heard, RRR. No JVD, murmurs, rubs, gallops or clicks. No pedal edema. Gastrointestinal system: Abdomen is nondistended, soft and nontender. No organomegaly or masses felt. Normal bowel sounds heard. Central nervous system: Alert and oriented. No focal neurological deficits. Extremities: Symmetric 5 x 5 power. Skin: No rashes, lesions or ulcers Psychiatry: Judgement and insight appear poor. Mood & affect flat   Data  Reviewed: I have personally reviewed following labs and imaging studies  CBC: Recent Labs  Lab 02/01/20 1433 02/01/20 1614 02/02/20 0441  WBC 18.8* 17.3* 12.8*  NEUTROABS 16.2*  --   --   HGB 12.6* 10.0* 9.4*  HCT 39.8 30.6* 29.8*  MCV 92.8 91.6 92.0  PLT 268 242 409   Basic Metabolic Panel: Recent Labs  Lab 02/01/20 1433 02/01/20 1614 02/02/20 0441  NA 136  --  140  K 3.6  --  3.2*  CL 99  --  109  CO2 25  --  24  GLUCOSE 119*  --  108*  BUN 11  --  7*  CREATININE 0.97 0.84 0.72  CALCIUM 8.9  --  7.6*  MG  --   --  2.0   GFR: Estimated Creatinine Clearance: 75.2 mL/min (by C-G formula based on SCr of 0.72 mg/dL). Liver Function Tests: Recent Labs  Lab 02/01/20 1433  AST 69*  ALT 40  ALKPHOS 216*  BILITOT 0.9  PROT 7.4  ALBUMIN 3.2*   No results for input(s): LIPASE, AMYLASE in the last 168 hours. No results for input(s): AMMONIA in the last 168 hours. Coagulation Profile: Recent Labs  Lab 02/01/20 1433  INR 1.5*   Cardiac Enzymes: No results for input(s): CKTOTAL, CKMB, CKMBINDEX, TROPONINI in the last 168 hours. BNP (last 3 results) No results for input(s): PROBNP in the last 8760 hours. HbA1C: No results for input(s): HGBA1C in the last 72 hours. CBG: Recent Labs  Lab 02/01/20 1642 02/01/20 2234 02/02/20 0741  GLUCAP 116* 91 93   Lipid Profile: No results for input(s): CHOL, HDL, LDLCALC, TRIG, CHOLHDL, LDLDIRECT in the last  72 hours. Thyroid Function Tests: Recent Labs    02/01/20 1700  TSH 1.176   Anemia Panel: No results for input(s): VITAMINB12, FOLATE, FERRITIN, TIBC, IRON, RETICCTPCT in the last 72 hours. Sepsis Labs: Recent Labs  Lab 02/01/20 1400 02/01/20 1700  PROCALCITON  --  0.28  LATICACIDVEN 1.8 1.0    Recent Results (from the past 240 hour(s))  SARS Coronavirus 2 by RT PCR (hospital order, performed in [facility] hospital lab) Nasopharyngeal Nasopharyngeal Swab     Status: None   Collection Time: 02/01/20  2:37 PM   Specimen: Nasopharyngeal Swab  Result Value Ref Range Status   SARS Coronavirus 2 NEGATIVE NEGATIVE Final    Comment: (NOTE) SARS-CoV-2 target nucleic acids are NOT DETECTED.  The SARS-CoV-2 RNA is generally detectable in upper and lower respiratory specimens during the acute phase of infection. The lowest concentration of SARS-CoV-2 viral copies this assay can detect is 250 copies / mL. A negative result does not preclude SARS-CoV-2 infection and should not be used as the sole basis for treatment or other patient management decisions.  A negative result may occur with improper specimen collection / handling, submission of specimen other than nasopharyngeal swab, presence of viral mutation(s) within the areas targeted by this assay, and inadequate number of viral copies (<250 copies / mL). A negative result must be combined with clinical observations, patient history, and epidemiological information.  Fact Sheet for Patients:   StrictlyIdeas.no  Fact Sheet for Healthcare Providers: BankingDealers.co.za  This test is not yet approved or  cleared by the [place] FDA and has been authorized for detection and/or diagnosis of SARS-CoV-2 by FDA under an Emergency Use Authorization (EUA).  This EUA will remain in effect (meaning this test can be used) for the duration of the COVID-19 declaration under Section 564(b)(1)  of the Act, 21 U.S.C. section 360bbb-3(b)(1), unless the authorization is terminated or revoked sooner.  Performed at [facility], [place] [address], [place], [place] [address]   Urine culture     Status: None   Collection Time: 02/01/20  2:38 PM   Specimen: In/Out Cath Urine  Result Value Ref Range Status   Specimen Description IN/OUT CATH URINE  Final   Special Requests NONE  Final   Culture   Final    NO GROWTH Performed at [facility], [place] [address] [place], [place] [address]    Report Status 02/02/2020 FINAL  Final      Radiology Studies: DG Wrist Complete Right  Result Date: 01/31/2020 CLINICAL DATA:  RIGHT wrist pain after catching a fall in the bathroom, constant pain with swelling, limited range of motion EXAM: RIGHT WRIST - COMPLETE 3+ VIEW COMPARISON:  None FINDINGS: Osseous demineralization. Joint space narrowing at STT joint and third MCP joint. Displaced fracture of the distal RIGHT ulnar metadiaphysis. No additional fracture, dislocation, or bone destruction. Small vessel vascular calcifications. IMPRESSION: Displaced RIGHT ulnar metadiaphyseal fracture. Osseous demineralization with scattered degenerative changes. Electronically Signed   By: Lavonia Dana M.D.   On: 01/31/2020 13:47   DG Chest Port 1 View  Result Date: 02/01/2020 CLINICAL DATA:  Sepsis, hypoxia EXAM: PORTABLE CHEST 1 VIEW COMPARISON:  10/05/2019 FINDINGS: Progression of right lower lobe airspace disease which could be pneumonia. Mild left lower lobe airspace disease unchanged. COPD. Negative for heart failure. Probable small bilateral pleural effusions. IMPRESSION: Progression of right lower lobe airspace disease, probable pneumonia Electronically Signed   By: Franchot Gallo M.D.   On: 02/01/2020 14:17    Scheduled Meds: . aspirin EC  81 mg Oral Daily  . carbamazepine  400 mg Oral BID  . heparin  5,000 Units Subcutaneous Q8H  . insulin aspart  0-6 Units Subcutaneous TID WC  . potassium  chloride  40 mEq Oral Q4H  . Vitamin D (Ergocalciferol)  50,000 Units Oral Q7 days   Continuous Infusions: . sodium chloride 75 mL/hr at 02/02/20 0400  . azithromycin Stopped (02/01/20 1756)  . piperacillin-tazobactam (ZOSYN)  IV       LOS: 1 day   Time spent: 35 minutes   Darliss Cheney, MD [facility]  02/02/2020, 10:22 AM   To contact the attending provider between 7A-7P or the covering provider during after hours 7P-7A, please log into the web site [email or web address].

## 2020-02-03 DIAGNOSIS — J690 Pneumonitis due to inhalation of food and vomit: Secondary | ICD-10-CM

## 2020-02-03 DIAGNOSIS — A419 Sepsis, unspecified organism: Principal | ICD-10-CM

## 2020-02-03 DIAGNOSIS — I480 Paroxysmal atrial fibrillation: Secondary | ICD-10-CM

## 2020-02-03 DIAGNOSIS — Z515 Encounter for palliative care: Secondary | ICD-10-CM

## 2020-02-03 LAB — LEGIONELLA PNEUMOPHILA SEROGP 1 UR AG: L. pneumophila Serogp 1 Ur Ag: NEGATIVE

## 2020-02-03 LAB — BASIC METABOLIC PANEL
Anion gap: 11 (ref 5–15)
BUN: <5 mg/dL — ABNORMAL LOW (ref 8–23)
CO2: 21 mmol/L — ABNORMAL LOW (ref 22–32)
Calcium: 7.9 mg/dL — ABNORMAL LOW (ref 8.9–10.3)
Chloride: 105 mmol/L (ref 98–111)
Creatinine, Ser: 0.64 mg/dL (ref 0.61–1.24)
GFR calc Af Amer: >60 mL/min (ref >60)
GFR calc non Af Amer: >60 mL/min (ref >60)
Glucose, Bld: 114 mg/dL — ABNORMAL HIGH (ref 70–99)
Potassium: 3.8 mmol/L (ref 3.5–5.1)
Sodium: 137 mmol/L (ref 135–145)

## 2020-02-03 LAB — CBC
HCT: 34.8 % — ABNORMAL LOW (ref 39.0–52.0)
Hemoglobin: 11 g/dL — ABNORMAL LOW (ref 13.0–17.0)
MCH: 29.6 pg (ref 26.0–34.0)
MCHC: 31.6 g/dL (ref 30.0–36.0)
MCV: 93.8 fL (ref 80.0–100.0)
Platelets: 253 10*3/uL (ref 150–400)
RBC: 3.71 MIL/uL — ABNORMAL LOW (ref 4.22–5.81)
RDW: 13.8 % (ref 11.5–15.5)
WBC: 15.1 10*3/uL — ABNORMAL HIGH (ref 4.0–10.5)
nRBC: 0 % (ref 0.0–0.2)

## 2020-02-03 LAB — MAGNESIUM: Magnesium: 2 mg/dL (ref 1.7–2.4)

## 2020-02-03 LAB — GLUCOSE, CAPILLARY
Glucose-Capillary: 102 mg/dL — ABNORMAL HIGH (ref 70–99)
Glucose-Capillary: 105 mg/dL — ABNORMAL HIGH (ref 70–99)
Glucose-Capillary: 111 mg/dL — ABNORMAL HIGH (ref 70–99)
Glucose-Capillary: 109 mg/dL — ABNORMAL HIGH (ref 70–99)

## 2020-02-03 MED ORDER — AMOXICILLIN-POT CLAVULANATE 875-125 MG PO TABS
1.0000 | ORAL_TABLET | Freq: Two times a day (BID) | ORAL | Status: DC
  Administered 2020-02-03 – 2020-02-04 (×4): 1 via ORAL
  Filled 2020-02-03 (×5): qty 1

## 2020-02-03 MED ORDER — POTASSIUM CHLORIDE CRYS ER 20 MEQ PO TBCR
40.0000 meq | EXTENDED_RELEASE_TABLET | Freq: Every day | ORAL | Status: DC
  Administered 2020-02-03 – 2020-02-04 (×2): 40 meq via ORAL
  Filled 2020-02-03 (×3): qty 2

## 2020-02-03 MED ORDER — AMLODIPINE BESYLATE 5 MG PO TABS
5.0000 mg | ORAL_TABLET | Freq: Every day | ORAL | Status: DC
  Administered 2020-02-03 – 2020-02-04 (×2): 5 mg via ORAL
  Filled 2020-02-03: qty 1

## 2020-02-03 NOTE — Evaluation (Signed)
Occupational Therapy Evaluation Patient Details Name: Richard Mitchell MRN: [id number] DOB: [birth year] Today's Date: 02/03/2020    History of Present Illness 79 y.o. male with medical history significant of peripheral arterial disease, subdural hematoma status post craniotomy, HTN, seizure disorder, chronic dysphagia on pured diet at home brought to the hospital for generalized weakness and hypotension at home.  Apparently patient had fallen couple of days ago fracturing his right wrist, was [facility] by [facility] and splint was placed.  Wife and patient tells me he has had poor appetite for several days with poor oral intake during this time he has been feeling more dizzy and unsteady on his feet causing him to fall.   Clinical Impression   PTA pt living with wife and using RW for mobility. Pt reports having as needed assist for BADL from wife. At time of eval, pt presents with ability to complete bed mobility at mod A and sit <> stands at mod A. Pt needs increased assist to manage posterior lean and to maintain balance. Multimodal cues given to not bear weight through RUE with transfer. Pt continues to state "it's fine I can use my R hand to push" showing decreased awareness of deficits. Cognitive deficits also noted in safety, memory, command following, and problem solving. Pt currently requires total A to don his tennis shoes in preparation for standing. VSS on RA this date. Given current status, recommend SNF to support safety, BADL engagement, and to reduce falls. OT will continue to follow per POC listed below.   Follow Up Recommendations  SNF;Supervision/Assistance - 24 hour    Equipment Recommendations  None recommended by OT    Recommendations for Other Services       Precautions / Restrictions Precautions Precautions: Fall Precaution Comments: monitor SpO2 Restrictions Weight Bearing Restrictions: No      Mobility Bed Mobility Overal bed mobility: Needs Assistance Bed  Mobility: Supine to Sit     Supine to sit: Mod assist     General bed mobility comments: pt requires assist for hand placement, initation of BLEs to EOB and to pull trunk into upright position. Bed pads used to help level hips  Transfers Overall transfer level: Needs assistance Equipment used: 1 person hand held assist Transfers: Sit to/from Stand Sit to Stand: Mod assist         General transfer comment: pt requried assist with power up to stand and managing posterior lean with knees hyperextended on bed. increased cues to not rely on RUE    Balance Overall balance assessment: Needs assistance Sitting-balance support: No upper extremity supported;Single extremity supported;Feet supported Sitting balance-Leahy Scale: Fair     Standing balance support: Single extremity supported Standing balance-Leahy Scale: Poor Standing balance comment: reliant on external assist from OT                           ADL either performed or assessed with clinical judgement   ADL Overall ADL's : Needs assistance/impaired Eating/Feeding: Set up;Sitting   Grooming: Minimal assistance;Sitting   Upper Body Bathing: Moderate assistance;Sitting   Lower Body Bathing: Maximal assistance;Sitting/lateral leans;Sit to/from stand   Upper Body Dressing : Moderate assistance;Sitting   Lower Body Dressing: Maximal assistance;Sitting/lateral leans;Sit to/from stand   Toilet Transfer: Moderate assistance;Stand-pivot;BSC Toilet Transfer Details (indicate cue type and reason): assist to power up in standing and to manage posterior lean. Increased assist for safe descent Toileting- Clothing Manipulation and Hygiene: Maximal assistance;Total assistance;Sitting/lateral lean;Sit to/from stand  Functional mobility during ADLs: Moderate assistance;Cueing for sequencing;Cueing for safety (pivots only) General ADL Comments: needs increased cues to not use RUE with transfers     Vision Patient  Visual Report: No change from baseline       Perception     Praxis      Pertinent Vitals/Pain Pain Assessment: No/denies pain     Hand Dominance Right   Extremity/Trunk Assessment Upper Extremity Assessment Upper Extremity Assessment: RUE deficits/detail RUE Deficits / Details: immobilized in cast and sling RUE: Unable to fully assess due to immobilization   Lower Extremity Assessment Lower Extremity Assessment: Defer to PT evaluation       Communication Communication Communication: HOH   Cognition Arousal/Alertness: Awake/alert Behavior During Therapy: WFL for tasks assessed/performed Overall Cognitive Status: Impaired/Different from baseline Area of Impairment: Attention;Memory;Following commands;Safety/judgement;Awareness;Problem solving                   Current Attention Level: Sustained Memory: Decreased recall of precautions Following Commands: Follows one step commands consistently Safety/Judgement: Decreased awareness of safety;Decreased awareness of deficits Awareness: Emergent Problem Solving: Slow processing;Requires verbal cues General Comments: pt tangential, needin cues to redirect. Overall decreased awareness of deficits with slow processing   General Comments  pt on RA during session with VSS    Exercises     Shoulder Instructions      Home Living Family/patient expects to be discharged to:: Private residence Living Arrangements: Spouse/significant other Available Help at Discharge: Family;Available 24 hours/day Type of Home: House Home Access: Stairs to enter [employer] of Steps: 2 Entrance Stairs-Rails: None Home Layout: One level     Bathroom Shower/Tub: Teacher, early years/pre: Handicapped height     Home Equipment: Environmental consultant - 2 wheels;Cane - single point          Prior Functioning/Environment Level of Independence: Independent with assistive device(s)        Comments: pt reports walking with a  RW inside and with a cane to the car        OT Problem List: Decreased strength;Decreased knowledge of use of DME or AE;Decreased knowledge of precautions;Decreased activity tolerance;Decreased cognition;Impaired UE functional use;Impaired balance (sitting and/or standing);Decreased safety awareness      OT Treatment/Interventions: Self-care/ADL training;Therapeutic exercise;Patient/family education;Balance training;Energy conservation;Therapeutic activities;DME and/or AE instruction    OT Goals(Current goals can be found in the care plan section) Acute Rehab OT Goals Patient Stated Goal: improve independence OT Goal Formulation: With patient Time For Goal Achievement: 02/17/20  OT Frequency: Min 2X/week   Barriers to D/C:            Co-evaluation              AM-PAC OT "6 Clicks" Daily Activity     Outcome Measure Help from another person eating meals?: A Little Help from another person taking care of personal grooming?: A Little Help from another person toileting, which includes using toliet, bedpan, or urinal?: A Lot Help from another person bathing (including washing, rinsing, drying)?: A Lot Help from another person to put on and taking off regular upper body clothing?: A Lot Help from another person to put on and taking off regular lower body clothing?: Total 6 Click Score: 13   End of Session Equipment Utilized During Treatment: Gait belt Nurse Communication: Mobility status  Activity Tolerance: Patient tolerated treatment well Patient left: in bed;with call bell/phone within reach;with bed alarm set  OT Visit Diagnosis: Unsteadiness on feet (R26.81);Other abnormalities of gait and  mobility (R26.89);Muscle weakness (generalized) (M62.81);History of falling (Z91.81)                Time: 4314-2767 OT Time Calculation (min): 40 min Charges:  OT General Charges $OT Visit: 1 Visit OT Evaluation $OT Eval Moderate Complexity: 1 Mod OT Treatments $Self Care/Home  Management : 23-37 mins  Zenovia Jarred, MSOT, OTR/L Acute Rehabilitation Services [facility] Office Number: [phone] Pager: [phone]  Zenovia Jarred 02/03/2020, 5:13 PM

## 2020-02-03 NOTE — Evaluation (Signed)
Physical Therapy Evaluation Patient Details Name: Richard Mitchell MRN: [id number] DOB: 08-Jan-1941 Today's Date: 02/03/2020   History of Present Illness  79 y.o. male with medical history significant of peripheral arterial disease, subdural hematoma status post craniotomy, HTN, seizure disorder, chronic dysphagia on pured diet at home brought to the hospital for generalized weakness and hypotension at home.  Apparently patient had fallen couple of days ago fracturing his right wrist, was [facility] and splint was placed.  Wife and patient tells me he has had poor appetite for several days with poor oral intake during this time he has been feeling more dizzy and unsteady on his feet causing him to fall.  Clinical Impression  Pt presents to PT with deficits in functional mobility, gait, balance, endurance, strength, power, and cardiopulmonary function. Pt is generally weak and demonstrates poor balance with a preference for a posterior lean during session. Pt has a history of recent fall and remains at a high risk for falling currently due to weakness and imbalance. Pt will benefit from continued acute PT services to improve mobility quality and reduce falls risk at this time. PT will benefit from SNF placement at the time of discharge to improve mobility quality and to reduce falls risk.    Follow Up Recommendations SNF;Supervision/Assistance - 24 hour    Equipment Recommendations  Wheelchair (measurements PT);Wheelchair cushion (measurements PT);Hospital bed;3in1 (PT)    Recommendations for Other Services       Precautions / Restrictions Precautions Precautions: Fall Precaution Comments: monitor SpO2 Restrictions Weight Bearing Restrictions: No      Mobility  Bed Mobility Overal bed mobility: Needs Assistance Bed Mobility: Supine to Sit     Supine to sit: Min assist;HOB elevated     General bed mobility comments: use of railing  Transfers Overall transfer level:  Needs assistance Equipment used: 1 person hand held assist Transfers: Sit to/from [employer] Transfers Sit to Stand: Mod assist;From elevated surface Stand pivot transfers: Mod assist       General transfer comment: pt's feet slipping forward despite use of non-slip socks, posterior lean, requires assist to power up without use of RUE  Ambulation/Gait                Stairs            Wheelchair Mobility    Modified Rankin (Stroke Patients Only)       Balance Overall balance assessment: Needs assistance Sitting-balance support: No upper extremity supported;Single extremity supported;Feet supported Sitting balance-Leahy Scale: Fair Sitting balance - Comments: close supervision without UE support   Standing balance support: Single extremity supported Standing balance-Leahy Scale: Poor Standing balance comment: modA to maintain static standing balance                             Pertinent Vitals/Pain Pain Assessment: Faces Faces Pain Scale: No hurt    Home Living Family/patient expects to be discharged to:: Private residence Living Arrangements: Spouse/significant other Available Help at Discharge: Family;Available 24 hours/day Type of Home: House Home Access: Stairs to enter Entrance Stairs-Rails: None Entrance Stairs-Number of Steps: 2 Home Layout: One level Home Equipment: Walker - 2 wheels;Cane - single point      Prior Function Level of Independence: Independent with assistive device(s)         Comments: pt reports walking with a RW inside and with a cane to the car     Hand Dominance  Dominant Hand: Right    Extremity/Trunk Assessment   Upper Extremity Assessment Upper Extremity Assessment: RUE deficits/detail RUE Deficits / Details: maintained in sling throughout session    Lower Extremity Assessment Lower Extremity Assessment: Generalized weakness    Cervical / Trunk Assessment Cervical / Trunk Assessment:  Kyphotic  Communication   Communication: HOH  Cognition Arousal/Alertness: Awake/alert Behavior During Therapy: WFL for tasks assessed/performed Overall Cognitive Status: Impaired/Different from baseline Area of Impairment: Attention;Memory;Following commands;Safety/judgement;Awareness;Problem solving                   Current Attention Level: Sustained Memory: Decreased recall of precautions Following Commands: Follows one step commands consistently Safety/Judgement: Decreased awareness of safety;Decreased awareness of deficits Awareness: Emergent Problem Solving: Slow processing;Requires verbal cues        General Comments General comments (skin integrity, edema, etc.): pt on 6L HFNC during session, does desat on monitor to mid-70s but PT believes this is due to a non-reliable reading from pulse-ox. Saturation increased to high 80s-low 90s with adjustment of probe. Pt able to converse during session    Exercises     Assessment/Plan    PT Assessment Patient needs continued PT services  PT Problem List Decreased strength;Decreased activity tolerance;Decreased balance;Decreased mobility;Decreased cognition;Decreased knowledge of use of DME;Decreased safety awareness;Decreased knowledge of precautions;Cardiopulmonary status limiting activity       PT Treatment Interventions DME instruction;Gait training;Stair training;Functional mobility training;Therapeutic activities;Therapeutic exercise;Balance training;Neuromuscular re-education;Cognitive remediation;Patient/family education    PT Goals (Current goals can be found in the Care Plan section)  Acute Rehab PT Goals Patient Stated Goal: To improve mobility quality PT Goal Formulation: With patient Time For Goal Achievement: 02/17/20 Potential to Achieve Goals: Fair    Frequency Min 2X/week   Barriers to discharge        Co-evaluation               AM-PAC PT "6 Clicks" Mobility  Outcome Measure Help needed  turning from your back to your side while in a flat bed without using bedrails?: A Little Help needed moving from lying on your back to sitting on the side of a flat bed without using bedrails?: A Little Help needed moving to and from a bed to a chair (including a wheelchair)?: A Lot Help needed standing up from a chair using your arms (e.g., wheelchair or bedside chair)?: A Lot Help needed to walk in hospital room?: Total Help needed climbing 3-5 steps with a railing? : Total 6 Click Score: 12    End of Session Equipment Utilized During Treatment: Oxygen Activity Tolerance: Patient tolerated treatment well Patient left: in chair;with call bell/phone within reach;with chair alarm set Nurse Communication: Mobility status;Need for lift equipment (STEDY) PT Visit Diagnosis: Unsteadiness on feet (R26.81);Other abnormalities of gait and mobility (R26.89);History of falling (Z91.81);Muscle weakness (generalized) (M62.81)    Time: 7262-0355 PT Time Calculation (min) (ACUTE ONLY): 22 min   Charges:   PT Evaluation $PT Eval Moderate Complexity: 1 Mod          Zenaida Niece, PT, DPT Acute Rehabilitation Pager: [phone]   Zenaida Niece 02/03/2020, 10:29 AM

## 2020-02-03 NOTE — Progress Notes (Signed)
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 Joya Salm HTN PAD seizure disorder Paroxysmal A. fib flutter Mobitz second-degree (not a candidate for anticoagulation)  after hospitalization January 2021 was supposed to go home with hospice He was being followed by therapy hospice in the outpatient setting Presented back to ED 02/01/2020 with generalized weakness and hypotension after having a fall had fractured his right  wrist and the splint was placed on the hand P.o. has diminished significantly over several days with reported fevers Ultimately found to have septic shock with aspiration pneumonia in the right lower lobe patient was given IV fluids because of hypotension protocol initiated antibiotics were adjusted  Assessment & Plan:   Principal Problem:   Sepsis ([place]) Active Problems:   PAD (peripheral artery disease) ([place])   Essential hypertension   Aspiration pneumonia ([place])   DNR (do not resuscitate)   Adult failure to thrive   Paroxysmal A-fib ([place])   Atrial flutter ([place])   1. Mild sepsis secondary to likely aspiration a. Patient noncompliant with diet and that was reinforced today b. Broad-spectrum antibiotics tailored-->Augmentin 7/28-Legionella [-] c. Repeat labs in a.m. d. Appears to have been weaned off successfully from nasal cannula to some degree e. We will need to desat screen prior to discharge f.  2. Right wrist fracture status post evaluation Dr. Margaretmary Lombard in sugar tong splint a. Requires total assist according to OT with regards to safety b. Orthopedic hand surgeon expects follow-up in several weeks in the office with conservative  management c. Continue Tylenol first choice for pain mild, OxyIR 5 every 4 as needed for severe pain 3. A-flutter/fib not on anticoagulation a. Not on rate control? b. Continue aspirin 81 daily 4. Prior craniotomy a. Some cognitive deficits likely secondary to this along with associated seizures b. Continue Tegretol 400 twice daily aspirin 81 5. HTN a. Moderately controlled pressures at this time b. Not on meds at this time start amlodipine 5 today 6. PAD a. Continue aspirin as above 7. Seizures 8. CVA  DVT prophylaxis: Heparin Code Status: DNR Family Communication: None present  Disposition:   Status is: Inpatient  Remains inpatient appropriate because:Unsafe d/c plan   Dispo: The patient is from: Home              Anticipated d/c is to: SNF              Anticipated d/c date is: 2 days              Patient currently is not medically stable to d/c.       Consultants:   Orthopedics  Procedures: Splinting  Antimicrobials: Currently Augmentin   Subjective: Awake alert shouting out however calms significantly on redirecting He is somewhat coherent but does at times seem confused  Objective: Vitals:   02/02/20 1650 02/02/20 2058 02/03/20 0300 02/03/20 0353  BP: (!) 132/75 123/65    Pulse: 70 63    Resp: 20 18  19   Temp: 97.7 F (36.5 C) 98.7 F (37.1 C)  98.9 F (37.2 C)  TempSrc: Oral Oral  Oral  SpO2: 96% 99% 94%   Weight:      Height:        Intake/Output Summary (Last 24  hours) at 02/03/2020 0730 Last data filed at 02/03/2020 0400 Gross per 24 hour  Intake 530 ml  Output 1200 ml  Net -670 ml   Filed Weights   02/01/20 1359 02/02/20 0340  Weight: 68 kg 73.4 kg    Examination:  General exam: EOMI NCAT no focal deficit Respiratory system: Clinically clear no added sound Cardiovascular system: S1-S2 no murmur rub Gastrointestinal system: Soft nontender no rebound. Central nervous system: Neurologically intact moving 4 limbs equally poor  dentition smile symmetric ROM intact Extremities: ROM intact Skin: Soft supple Psychiatry: Euthymic but occasionally confused  Data Reviewed: I have personally reviewed following labs and imaging studies CO2 to 21 WBC 15 up from 12 Hemoglobin 11  Radiology Studies: DG Chest Port 1 View  Result Date: 02/01/2020 CLINICAL DATA:  Sepsis, hypoxia EXAM: PORTABLE CHEST 1 VIEW COMPARISON:  10/05/2019 FINDINGS: Progression of right lower lobe airspace disease which could be pneumonia. Mild left lower lobe airspace disease unchanged. COPD. Negative for heart failure. Probable small bilateral pleural effusions. IMPRESSION: Progression of right lower lobe airspace disease, probable pneumonia Electronically Signed   By: Franchot Gallo M.D.   On: 02/01/2020 14:17     Scheduled Meds:  amoxicillin-clavulanate  1 tablet Oral Q12H   aspirin EC  81 mg Oral Daily   carbamazepine  400 mg Oral BID   feeding supplement (ENSURE ENLIVE)  237 mL Oral BID BM   heparin  5,000 Units Subcutaneous Q8H   insulin aspart  0-6 Units Subcutaneous TID WC   potassium chloride  40 mEq Oral Daily   Vitamin D (Ergocalciferol)  50,000 Units Oral Q7 days   Continuous Infusions:   LOS: 2 days    Time spent: 109  Nita Sells, MD [facility] To contact the attending provider between 7A-7P or the covering provider during after hours 7P-7A, please log into the web site [email or web address] and access using universal [place] password for that web site. If you do not have the password, please call the hospital operator.  02/03/2020, 7:30 AM

## 2020-02-03 NOTE — Progress Notes (Signed)
Initial Nutrition Assessment  DOCUMENTATION CODES:   Not applicable  INTERVENTION:  Continue Ensure Enlive po BID, each supplement provides 350 kcal and 20 grams of protein.  Encourage adequate PO intake.   NUTRITION DIAGNOSIS:   Inadequate oral intake related to poor appetite as evidenced by per patient/family report.  GOAL:   Patient will meet greater than or equal to 90% of their needs  MONITOR:   PO intake, Supplement acceptance, Skin, Weight trends, Labs, I & O's  REASON FOR ASSESSMENT:   Malnutrition Screening Tool    ASSESSMENT:   79 y.o. male with medical history significant of peripheral arterial disease, subdural hematoma status post craniotomy, HTN, seizure disorder, presents wuth generalized weakness and hypotension. Pt with fall at home with R wrist fracture currently on splint. patient was noted to be in septic shock secondary to aspiration pneumonia.  Chest x-ray showed right lower lobe pneumonia.   Pt unavailable during attempted time of contact. Per MD, pt with poor appetite/po over the past couple of days prior to admission. Pt with chronic dysphagia and consumes a pureed diet at home. Wife has been encouraging pt po intake. Per SLP evaluation, pt with severe aspiration risk. Pt accepting of aspiration risk and wishes to continue to take PO. Meal completion has been 50%. Pt currently has Ensure ordered and has been consuming them. RD to continue with current orders to aid in adequate nutrition. Pt with no significant weight loss per weight records.   Unable to complete Nutrition-Focused physical exam at this time.   Labs and medications reviewed.   Diet Order:   Diet Order            DIET - DYS 1 Room service appropriate? Yes; Fluid consistency: Thin  Diet effective now                 EDUCATION NEEDS:   Not appropriate for education at this time  Skin:  Skin Assessment: Reviewed RN Assessment  Last BM:  Unknown  Height:   Ht Readings from  Last 1 Encounters:  02/02/20 6\' 2"  (1.88 m)    Weight:   Wt Readings from Last 1 Encounters:  02/02/20 73.4 kg   BMI:  Body mass index is 20.78 kg/m.  Estimated Nutritional Needs:   Kcal:  1700-1850  Protein:  75-90 grams  Fluid:  >/= 1.7 L/day  Corrin Parker, MS, RD, LDN RD pager number/after hours weekend pager number on Amion.

## 2020-02-03 NOTE — Consult Note (Signed)
Consultation Note Date: 02/03/2020   Patient Name: Richard Mitchell  DOB: 1940/11/16  MRN: [id number]  Age / Sex: 79 y.o., male  PCP: Richard Redwood, MD Referring Physician: Nita Sells, MD  Reason for Consultation: Establishing goals of care  HPI/Patient Profile: 79 y.o. male  with past medical history of CVA, SDH s/p craniotomy (1998), PAD, seizure disorder, dysphagia, afib with mobitz type two block and recurrent falls who was admitted on 02/01/2020 with right lower lobe pneumonia suspicious for aspiration pneumonia.  At the time of admission he was septic with hypotension and heart rate in the 120s.  Richard Mitchell fell and broke his right wrist just 3 days ago.  He was seen in the ER at the time.  This admission Dr. Amedeo Mitchell was consulted for the wrist and recommended conservative management with a sugar tong cast.  Richard Mitchell was scene by [facility] PMT earlier this year.  A MOST form was completed.  The patient selected limited interventions and no feeding tube.  He is followed regularly by [facility] Palliative outpatient.   Clinical Assessment and Goals of Care:  I have reviewed medical records including EPIC notes, labs and imaging, received report from , examined the patient and spoke on the phone with patient's wife Richard Mitchell  to discuss diagnosis prognosis, [place], EOL wishes, disposition and options.  I introduced Palliative Medicine as specialized medical care for people living with serious illness. It focuses on providing relief from the symptoms and stress of a serious illness.   We discussed a brief life review of the patient.  Richard Mitchell and Richard Mitchell have been married 51 years.  They have 1 daughter.  The two of them live at home together.  Richard Mitchell explains that she has CHF and a Psychologist, forensic.  She tells me that Richard Mitchell has declined significantly.  Often even before he fractured his wrist he did not want to get out of  bed.  His eating was intermittent.  Richard Mitchell is unable to help move him.   Richard Mitchell went to SNF in February of this year.  Richard Mitchell felt that SNF was not very helpful for him as he still was not able to walk as well on discharge as he could before he came to the hospital.  We discussed that his decline could be the "natural course".  She is hopeful that he will improve and be able to discharge to rehab from the hospital this time.  Richard Mitchell appreciates outpatient Palliative follow up from [facility].    Richard Mitchell receives emotional support from her sister, but her daughter has a busy life of her own and is not able to help care for her father.  We discussed his current illness and what it means in the larger context of his on-going co-morbidities.  Natural disease trajectory and expectations at EOL were discussed.  I explained that given his aspiration pneumonia, his decline in function to primarily bed bound and his wrist fracture - these changes likely qualify him for hospice services either in the home on at  SNF after acute rehab is finished.  We discussed both Hospice services at a nursing facility as well as end of life Hospice House.  If Richard Mitchell continues to actively aspirate he may be best served by EOL Hospice facility.  Otherwise his wife would like for him to go to acute rehab with Palliative to follow to try to improve his strength.    Questions and concerns were addressed.  The family was encouraged to call with questions or concerns.    Primary Decision Maker:  PATIENT and his wife Richard Mitchell    SUMMARY OF RECOMMENDATIONS    Continue current care.  If patient improves wife would like for him to go to acute rehab with Palliative (preferably not at the same place he was in Feb).  If patient declines further he will likely be best cared for by EOL hospice facility.  Code Status/Advance Care Planning:  DNR   Symptom Management:   Per primary  Additional Recommendations (Limitations, Scope,  Preferences):  Treat the treatable with non-invasive measures.  Palliative Prophylaxis:   Delirium Protocol and Frequent Pain Assessment  Psycho-social/Spiritual:   Desire for further Chaplaincy support: Welcomed.  Prognosis:  If he continues to aspirate his prognosis will be very limited.  If he is able to gain strength he likely has months.    Discharge Planning: To Be Determined      Primary Diagnoses: Present on Admission: . Sepsis ([place]) . Aspiration pneumonia ([place]) . Atrial flutter ([place]) . Adult failure to thrive . DNR (do not resuscitate) . Essential hypertension . PAD (peripheral artery disease) ([place]) . Paroxysmal A-fib ([place])   I have reviewed the medical record, interviewed the patient and family, and examined the patient. The following aspects are pertinent.  Past Medical History:  Diagnosis Date  . Anemia   . Cancer ([place])    [place]  . Carotid artery occlusion   . Dysphasia S/P CVA (cerebrovascular accident)   . Gait disturbance 11/12/2012  . History of subdural hematoma (post traumatic) Approx 1998  . Hypertension   . Keratosis 11/12/2012   x2  . Onychomycosis 11/12/2012   extreme neglect x 10  . Osteoporosis   . PAD (peripheral artery disease) ([place]) 11/12/2012   possible due to diminished pedal pulses  . Seizure disorder [facility])    Social History   Socioeconomic History  . Marital status: Married    Spouse name: Not on file  . Number of children: Not on file  . Years of education: Not on file  . Highest education level: Not on file  Occupational History  . Not on file  Tobacco Use  . Smoking status: Former Smoker    Quit date: 11/19/1987    Years since quitting: 32.2  . Smokeless tobacco: Never Used  Vaping Use  . Vaping Use: Never used  Substance and Sexual Activity  . Alcohol use: No    Alcohol/week: 0.0 standard drinks  . Drug use: No  . Sexual activity: Not on file  Other Topics Concern  . Not on file  Social History Narrative  .  Not on file   Social Determinants of Health   Financial Resource Strain:   . Difficulty of Paying Living Expenses:   Food Insecurity:   . Worried About Charity fundraiser in the Last Year:   . Arboriculturist in the Last Year:   Transportation Needs:   . Film/video editor (Medical):   Marland Kitchen Lack of Transportation (Non-Medical):   Physical  Activity:   . Days of Exercise per Week:   . Minutes of Exercise per Session:   Stress:   . Feeling of Stress :   Social Connections:   . Frequency of Communication with Friends and Family:   . Frequency of Social Gatherings with Friends and Family:   . Attends Religious Services:   . Active Member of Clubs or Organizations:   . Attends Archivist Meetings:   Marland Kitchen Marital Status:    Family History  Problem Relation Age of Onset  . Heart disease Mother        had pacemaker  . Cancer Mother        Breast  . Heart disease Sister   . Cancer Sister        Pancreatic  . Deep vein thrombosis Sister   . Heart attack Sister   . COPD Father     No Known Allergies   Vital Signs: BP (!) 143/85 (BP Location: Left Arm)   Pulse 70   Temp 98.1 F (36.7 C) (Oral)   Resp (!) 24   Ht 6\' 2"  (1.88 m)   Wt 73.4 kg   SpO2 97%   BMI 20.78 kg/m  Pain Scale: 0-10   Pain Score: Asleep   SpO2: SpO2: 97 % O2 Device:SpO2: 97 % O2 Flow Rate: .O2 Flow Rate (L/min): 4 L/min    Palliative Assessment/Data:  30%     Time In: 8:30 Time Out: 10:00 Time Total: 90 min. Visit consisted of counseling and education dealing with the complex and emotionally intense issues surrounding the need for palliative care and symptom management in the setting of serious and potentially life-threatening illness. Greater than 50%  of this time was spent counseling and coordinating care related to the above assessment and plan.  Signed by: Florentina Jenny, PA-C Palliative Medicine  Please contact Palliative Medicine Team phone at [phone] for questions  and concerns.  For individual provider: See Shea Evans

## 2020-02-03 NOTE — Progress Notes (Signed)
Manufacturing engineer [facility]) Community Based Palliative Care     This patient is enrolled in our palliative care services in the community.  [facility] will continue to follow for any discharge planning needs and to coordinate continuation of palliative care.   If you have questions or need assistance, please call [phone] or contact the hospital Liaison listed on AMION.      Clementeen Hoof, RN, BSN [facility] & Heart Liaison  [phone]

## 2020-02-04 ENCOUNTER — Inpatient Hospital Stay (HOSPITAL_COMMUNITY): Payer: PPO

## 2020-02-04 LAB — CBC
HCT: 30.1 % — ABNORMAL LOW (ref 39.0–52.0)
Hemoglobin: 9.9 g/dL — ABNORMAL LOW (ref 13.0–17.0)
MCH: 30 pg (ref 26.0–34.0)
MCHC: 32.9 g/dL (ref 30.0–36.0)
MCV: 91.2 fL (ref 80.0–100.0)
Platelets: 264 10*3/uL (ref 150–400)
RBC: 3.3 MIL/uL — ABNORMAL LOW (ref 4.22–5.81)
RDW: 14 % (ref 11.5–15.5)
WBC: 15.8 10*3/uL — ABNORMAL HIGH (ref 4.0–10.5)
nRBC: 0 % (ref 0.0–0.2)

## 2020-02-04 LAB — BASIC METABOLIC PANEL
Anion gap: 9 (ref 5–15)
BUN: 5 mg/dL — ABNORMAL LOW (ref 8–23)
CO2: 23 mmol/L (ref 22–32)
Calcium: 7.9 mg/dL — ABNORMAL LOW (ref 8.9–10.3)
Chloride: 106 mmol/L (ref 98–111)
Creatinine, Ser: 0.66 mg/dL (ref 0.61–1.24)
GFR calc Af Amer: >60 mL/min (ref >60)
GFR calc non Af Amer: >60 mL/min (ref >60)
Glucose, Bld: 100 mg/dL — ABNORMAL HIGH (ref 70–99)
Potassium: 3.7 mmol/L (ref 3.5–5.1)
Sodium: 138 mmol/L (ref 135–145)

## 2020-02-04 LAB — GLUCOSE, CAPILLARY
Glucose-Capillary: 105 mg/dL — ABNORMAL HIGH (ref 70–99)
Glucose-Capillary: 107 mg/dL — ABNORMAL HIGH (ref 70–99)
Glucose-Capillary: 105 mg/dL — ABNORMAL HIGH (ref 70–99)
Glucose-Capillary: 98 mg/dL (ref 70–99)

## 2020-02-04 LAB — SARS CORONAVIRUS 2 (TAT 6-24 HRS): SARS Coronavirus 2: NEGATIVE

## 2020-02-04 LAB — MAGNESIUM: Magnesium: 2 mg/dL (ref 1.7–2.4)

## 2020-02-04 MED ORDER — METOPROLOL TARTRATE 5 MG/5ML IV SOLN
INTRAVENOUS | Status: AC
  Administered 2020-02-04: 5 mg
  Filled 2020-02-04: qty 5

## 2020-02-04 MED ORDER — METOPROLOL SUCCINATE ER 25 MG PO TB24
12.5000 mg | ORAL_TABLET | Freq: Every day | ORAL | Status: DC
  Administered 2020-02-04 – 2020-02-05 (×2): 12.5 mg via ORAL
  Filled 2020-02-04 (×2): qty 1

## 2020-02-04 MED ORDER — IOHEXOL 300 MG/ML  SOLN
75.0000 mL | Freq: Once | INTRAMUSCULAR | Status: AC | PRN
  Administered 2020-02-04: 75 mL via INTRAVENOUS

## 2020-02-04 MED ORDER — METOPROLOL TARTRATE 5 MG/5ML IV SOLN
5.0000 mg | Freq: Four times a day (QID) | INTRAVENOUS | Status: DC
  Administered 2020-02-04 – 2020-02-05 (×3): 5 mg via INTRAVENOUS
  Filled 2020-02-04 (×4): qty 5

## 2020-02-04 MED ORDER — SALINE SPRAY 0.65 % NA SOLN
1.0000 | NASAL | Status: DC | PRN
  Filled 2020-02-04 (×2): qty 44

## 2020-02-04 NOTE — Progress Notes (Signed)
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 Joya Salm HTN PAD seizure disorder Paroxysmal A. fib flutter Mobitz second-degree (not a candidate for anticoagulation)  after hospitalization January 2021 was supposed to go home with hospice He was being followed by  hospice in the outpatient setting Presented back to ED 02/01/2020 with generalized weakness and hypotension after having a fall had fractured his right  wrist and the splint was placed on the hand P.o. has diminished significantly over several days with reported fevers Ultimately found to have sepsis with aspiration pneumonia in the right lower lobe patient was given IV fluids because of hypotension protocol initiated antibiotics were adjusted  Assessment & Plan:   Principal Problem:   Sepsis ([place]) Active Problems:   PAD (peripheral artery disease) ([place])   Essential hypertension   Aspiration pneumonia ([place])   DNR (do not resuscitate)   Adult failure to thrive   Paroxysmal A-fib ([place])   Atrial flutter ([place])   Palliative care encounter   1. Mobitz one atrial flutter CHADS2 score >4 not a anticoagulation candidate with tachycardia on 7/29 a. Not a candidate for anticoagulation given high risk of bleeding has bled score >4 b. Heart rate in the one forties this morning given one-time dose of metoprolol IV and heart rate dropped into the sixties although patient started having some weird shivering symptoms it was not a seizure c. Starting cautious metoprolol XL 12.5 daily for sustained action d. Monitor for pauses and obtain magnesium in the morning e. Aspirin 8109 2. ?  Lung abscess versus cancer a. CXR  portable performed 7/29 called emergently to me b. There appears to be an underlying right hilar mass so we are ordering a nonemergent CT c. I spoke with both patient and his wife in detail at the bedside subsequent to the x-rays about implications of this d. I think his performance scores are poor and he would not tolerate systemic chemo and we discussed this holistically e. I will have palliative care follow-up with regards to discussions once CT scan returns but this may be even as an outpatient 3. Mild sepsis secondary to likely aspiration a. Patient noncompliant with diet and that was reinforced today but remains unfortunately high aspiration risk b. Broad-spectrum antibiotics tailored-->Augmentin 7/28-Legionella [-] c. We will need to desat screen prior to discharge given intermittent oxygen requirement 4. Right wrist fracture status post evaluation Dr. Margaretmary Lombard in sugar tong splint a. Requires total assist according to OT with regards to safety b. Orthopedic hand surgeon expects follow-up in several weeks in the office with conservative management c. Continue Tylenol first choice for pain mild, OxyIR 5 every 4 as needed for severe pain 5. Prior craniotomy a. Some cognitive deficits likely secondary to this along with associated seizures b. Continue Tegretol 400 twice daily aspirin 81 6. HTN a. Moderately controlled pressures at this time b. Discontinue amlodipine in favor of metoprolol as above  7. PAD a. Continue aspirin as above 8. Seizures 9. CVA  DVT prophylaxis: Heparin Code Status: DNR Family Communication: Long discussion with wife at the bedside in person Martin Majestic over possible etiologies of what appears to be a lung mass and have encouraged him to think clearly about this over the next several days  we will follow-up as with palliative care if he is not discharged in several days.  Disposition:   Status is: Inpatient  Remains inpatient appropriate because:Unsafe d/c  plan   Dispo: The patient is from: Home              Anticipated d/c is to: SNF              Anticipated d/c date is: 1 day if medically stable and no further tachyarrhythmia              Patient currently is not medically stable to d/c.   Consultants:   Orthopedics  Procedures: Splinting  Antimicrobials: Currently Augmentin   Subjective: No distress but when I saw him heart rates were sustaining in the one twenties He was breathing somewhat rapidly When I reevaluated up subsequently he was with a heart rate of sixties after receiving metoprolol IV he has no current chest pain no cough or sputum He has no fever-we got an x-ray primarily this morning because white count has been sustaining in the 15 range I fear he will chronically aspirate and I shared this with family and encouraged him again to maintain the restrictions on his diet  Objective: Vitals:   02/04/20 0301 02/04/20 0500 02/04/20 0819 02/04/20 1215  BP: (!) 105/56  109/68 126/67  Pulse: 75  (!) 110 61  Resp: (!) 25  21 (!) 25  Temp: 97.9 F (36.6 C)  98.2 F (36.8 C) 97.6 F (36.4 C)  TempSrc: Oral  Oral Oral  SpO2: (!) 82%  (!) 88% 95%  Weight:  73.4 kg    Height:        Intake/Output Summary (Last 24 hours) at 02/04/2020 1644 Last data filed at 02/04/2020 1330 Gross per 24 hour  Intake 240 ml  Output 1210 ml  Net -970 ml   Filed Weights   02/01/20 1359 02/02/20 0340 02/04/20 0500  Weight: 68 kg 73.4 kg 73.4 kg    Examination:  General exam: EOMI NCAT no focal deficit Respiratory system: Clinically clear no added rales rhonchi Cardiovascular system: S1-S2 Mobitz 1/SVT?  2-1 flutter on monitors Gastrointestinal system: Soft nontender no rebound. Central nervous system: Neurologically intact moving 4 limbs equally poor dentition smile symmetric ROM intact Extremities: ROM intact Skin: Soft supple Psychiatry: Euthymic but occasionally confused  Data Reviewed: I have personally reviewed following  labs and imaging studies CO2 to 23 WBC maintaining at 15.8 Hemoglobin 11--9.9 Radiology Studies: DG CHEST PORT 1 VIEW  Result Date: 02/04/2020 CLINICAL DATA:  Follow-up pneumonia. EXAM: PORTABLE CHEST 1 VIEW COMPARISON:  02/01/2020 FINDINGS: The heart size is normal. Aortic atherosclerosis. Small to moderate bilateral pleural effusions are increased from previous exam. Within the right hilar region there is a suggestion of an underlying mass. Worsening aeration to the right lower lobe is identified with increasing airspace opacification. IMPRESSION: 1. Worsening aeration to the right lower lobe with increasing bilateral pleural effusions. 2. Suspect underlying right hilar mass. Further evaluation with contrast-enhanced CT chest is advised. These results will be called to the ordering clinician or representative by the Radiologist Assistant, and communication documented in the PACS or [employer]. Electronically Signed   By: Kerby Moors M.D.   On: 02/04/2020 11:53     Scheduled Meds: . amLODipine  5 mg Oral Daily  . amoxicillin-clavulanate  1 tablet Oral Q12H  . aspirin EC  81 mg Oral Daily  . carbamazepine  400 mg Oral BID  . feeding supplement (  ENSURE ENLIVE)  237 mL Oral BID BM  . heparin  5,000 Units Subcutaneous Q8H  . insulin aspart  0-6 Units Subcutaneous TID WC  . metoprolol succinate  12.5 mg Oral Daily  . metoprolol tartrate  5 mg Intravenous Q6H  . potassium chloride  40 mEq Oral Daily  . Vitamin D (Ergocalciferol)  50,000 Units Oral Q7 days   Continuous Infusions:   LOS: 3 days    Time spent: 13  Nita Sells, MD [facility] To contact the attending provider between 7A-7P or the covering provider during after hours 7P-7A, please log into the web site [email or web address] and access using universal [place] password for that web site. If you do not have the password, please call the hospital operator.  02/04/2020, 4:44 PM

## 2020-02-04 NOTE — NC FL2 (Signed)
[place] LEVEL OF CARE SCREENING TOOL     IDENTIFICATION  Patient Name: Richard Mitchell Birthdate: Oct 22, 1940 Sex: male Admission Date (Current Location): 02/01/2020  [facility] and [place] Number:  Herbalist and Address:  [facility] [place], [place] [address], [place], [place] [address]      Provider Number: [id number]  Attending Physician Name and Address:  Nita Sells, MD  Relative Name and Phone Number:  [name], daughter,   [phone]    Current Level of Care: Hospital Recommended Level of Care: [place] Prior Approval Number:    Date Approved/Denied:   PASRR Number: [id number]  Discharge Plan: SNF    Current Diagnoses: Patient Active Problem List   Diagnosis Date Noted  . Palliative care encounter   . Sepsis ([place]) 02/01/2020  . Mobitz type 1 second degree AV block 08/10/2019  . Paroxysmal A-fib ([place]) 08/10/2019  . Atrial flutter ([place]) 08/10/2019  . Palliative care by specialist   . DNR (do not resuscitate)   . Adult failure to thrive   . Dysphagia 08/05/2019  . Generalized weakness 08/05/2019  . Fall at home, initial encounter 08/05/2019  . Aspiration pneumonia ([place]) 08/04/2019  . Essential hypertension 01/21/2018  . Hyperlipidemia 01/21/2018  . Aftercare following surgery of the circulatory system, [place] 03/03/2014  . Occlusion and stenosis of carotid artery with cerebral infarction 03/03/2014  . PAD (peripheral artery disease) ([place])   . Onychomycosis   . Keratosis   . Occlusion and stenosis of carotid artery without mention of cerebral infarction 02/24/2013  . Gait disturbance 11/12/2012    Orientation RESPIRATION BLADDER Height & Weight     Self, Time, Situation, Place  O2 (Nasal cannula 2L) Continent, External catheter Weight: 161 lb 13.1 oz (73.4 kg) Height:  6\' 2"  (188 cm)  BEHAVIORAL SYMPTOMS/MOOD NEUROLOGICAL BOWEL NUTRITION STATUS      Incontinent Diet (Please see DC Summary)   AMBULATORY STATUS COMMUNICATION OF NEEDS Skin   Extensive Assist Verbally Normal                       Personal Care Assistance Level of Assistance  Bathing, Dressing, Feeding Bathing Assistance: Maximum assistance Feeding assistance: Limited assistance Dressing Assistance: Maximum assistance     Functional Limitations Info  Sight, Hearing, Speech Sight Info: Adequate Hearing Info: Adequate Speech Info: Adequate    SPECIAL CARE FACTORS FREQUENCY  PT (By licensed PT), OT (By licensed OT)     PT Frequency: 5x/week OT Frequency: 4x/week            Contractures Contractures Info: Not present    Additional Factors Info  Code Status, Allergies, Insulin Sliding Scale Code Status Info: DNR Allergies Info: NKA   Insulin Sliding Scale Info: See DC summary       Current Medications (02/04/2020):  This is the current hospital active medication list Current Facility-Administered Medications  Medication Dose Route Frequency Provider Last Rate Last Admin  . acetaminophen (TYLENOL) tablet 650 mg  650 mg Oral Q6H PRN Amin, Ankit Chirag, MD   650 mg at 02/02/20 1830   Or  . acetaminophen (TYLENOL) suppository 650 mg  650 mg Rectal Q6H PRN Amin, Ankit Chirag, MD      . amLODipine (NORVASC) tablet 5 mg  5 mg Oral Daily Nita Sells, MD   5 mg at 02/03/20 2020  . amoxicillin-clavulanate (AUGMENTIN) 875-125 MG per tablet 1 tablet  1 tablet Oral Q12H Nita Sells, MD  1 tablet at 02/03/20 2152  . aspirin EC tablet 81 mg  81 mg Oral Daily Amin, Ankit Chirag, MD   81 mg at 02/03/20 0926  . carbamazepine (TEGRETOL) tablet 400 mg  400 mg Oral BID Damita Lack, MD   400 mg at 02/03/20 2123  . feeding supplement (ENSURE ENLIVE) (ENSURE ENLIVE) liquid 237 mL  237 mL Oral BID BM Darliss Cheney, MD   237 mL at 02/03/20 0928  . heparin injection 5,000 Units  5,000 Units Subcutaneous Q8H Amin, Ankit Chirag, MD   5,000 Units at 02/04/20 0615  . insulin aspart (novoLOG)  injection 0-6 Units  0-6 Units Subcutaneous TID WC Amin, Ankit Chirag, MD      . ipratropium-albuterol (DUONEB) 0.5-2.5 (3) MG/3ML nebulizer solution 3 mL  3 mL Nebulization Q4H PRN Amin, Ankit Chirag, MD      . ondansetron (ZOFRAN) tablet 4 mg  4 mg Oral Q6H PRN Amin, Ankit Chirag, MD       Or  . ondansetron (ZOFRAN) injection 4 mg  4 mg Intravenous Q6H PRN Amin, Ankit Chirag, MD      . oxyCODONE (Oxy IR/ROXICODONE) immediate release tablet 5 mg  5 mg Oral Q4H PRN Amin, Jeanella Flattery, MD   5 mg at 02/04/20 0737  . potassium chloride SA (KLOR-CON) CR tablet 40 mEq  40 mEq Oral Daily Nita Sells, MD   40 mEq at 02/03/20 0926  . senna-docusate (Senokot-S) tablet 1 tablet  1 tablet Oral QHS PRN Amin, Jeanella Flattery, MD      . Vitamin D (Ergocalciferol) (DRISDOL) capsule 50,000 Units  50,000 Units Oral Q7 days Damita Lack, MD   50,000 Units at 02/02/20 7017     Discharge Medications: Please see discharge summary for a list of discharge medications.  Relevant Imaging Results:  Relevant Lab Results:   Additional Information SSN: [id number]  Benard Halsted, LCSW

## 2020-02-05 DIAGNOSIS — I4892 Unspecified atrial flutter: Secondary | ICD-10-CM

## 2020-02-05 DIAGNOSIS — Z7189 Other specified counseling: Secondary | ICD-10-CM

## 2020-02-05 DIAGNOSIS — Z66 Do not resuscitate: Secondary | ICD-10-CM

## 2020-02-05 LAB — MAGNESIUM: Magnesium: 2 mg/dL (ref 1.7–2.4)

## 2020-02-05 LAB — BASIC METABOLIC PANEL
Anion gap: 10 (ref 5–15)
BUN: <5 mg/dL — ABNORMAL LOW (ref 8–23)
CO2: 24 mmol/L (ref 22–32)
Calcium: 8.1 mg/dL — ABNORMAL LOW (ref 8.9–10.3)
Chloride: 104 mmol/L (ref 98–111)
Creatinine, Ser: 0.68 mg/dL (ref 0.61–1.24)
GFR calc Af Amer: >60 mL/min (ref >60)
GFR calc non Af Amer: >60 mL/min (ref >60)
Glucose, Bld: 91 mg/dL (ref 70–99)
Potassium: 3.8 mmol/L (ref 3.5–5.1)
Sodium: 138 mmol/L (ref 135–145)

## 2020-02-05 LAB — CBC
HCT: 31.9 % — ABNORMAL LOW (ref 39.0–52.0)
Hemoglobin: 10.2 g/dL — ABNORMAL LOW (ref 13.0–17.0)
MCH: 29.5 pg (ref 26.0–34.0)
MCHC: 32 g/dL (ref 30.0–36.0)
MCV: 92.2 fL (ref 80.0–100.0)
Platelets: 300 10*3/uL (ref 150–400)
RBC: 3.46 MIL/uL — ABNORMAL LOW (ref 4.22–5.81)
RDW: 14.3 % (ref 11.5–15.5)
WBC: 17.6 10*3/uL — ABNORMAL HIGH (ref 4.0–10.5)
nRBC: 0 % (ref 0.0–0.2)

## 2020-02-05 LAB — GLUCOSE, CAPILLARY: Glucose-Capillary: 99 mg/dL (ref 70–99)

## 2020-02-05 MED ORDER — MORPHINE SULFATE (CONCENTRATE) 10 MG/0.5ML PO SOLN: 2.5000 mg | ORAL | Status: DC | PRN

## 2020-02-05 MED ORDER — KETOROLAC TROMETHAMINE 15 MG/ML IJ SOLN: 7.5000 mg | Freq: Four times a day (QID) | INTRAMUSCULAR | Status: DC | PRN

## 2020-02-05 MED ORDER — BIOTENE DRY MOUTH MT LIQD: 15.0000 mL | OROMUCOSAL | Status: DC | PRN

## 2020-02-05 MED ORDER — METOPROLOL TARTRATE 25 MG PO TABS: 12.5000 mg | ORAL_TABLET | Freq: Two times a day (BID) | ORAL | Status: AC

## 2020-02-05 MED ORDER — HALOPERIDOL LACTATE 5 MG/ML IJ SOLN: 0.5000 mg | INTRAMUSCULAR | Status: DC | PRN

## 2020-02-05 MED ORDER — LORAZEPAM 0.5 MG PO TABS: 0.2500 mg | ORAL_TABLET | Freq: Four times a day (QID) | ORAL | 0 refills | Status: AC | PRN

## 2020-02-05 MED ORDER — HALOPERIDOL 1 MG PO TABS: 0.5000 mg | ORAL_TABLET | ORAL | Status: DC | PRN

## 2020-02-05 MED ORDER — NYSTATIN 100000 UNIT/ML MT SUSP
5.0000 mL | Freq: Four times a day (QID) | OROMUCOSAL | Status: DC
  Administered 2020-02-05: 500000 [IU] via ORAL
  Filled 2020-02-05: qty 5

## 2020-02-05 MED ORDER — SODIUM CHLORIDE 0.9% FLUSH: 3.0000 mL | Freq: Two times a day (BID) | INTRAVENOUS | Status: DC

## 2020-02-05 MED ORDER — HALOPERIDOL LACTATE 2 MG/ML PO CONC
0.5000 mg | ORAL | Status: DC | PRN
  Filled 2020-02-05: qty 0.3

## 2020-02-05 MED ORDER — GLYCOPYRROLATE 1 MG PO TABS: 1.0000 mg | ORAL_TABLET | ORAL | Status: DC | PRN

## 2020-02-05 MED ORDER — HALOPERIDOL LACTATE 2 MG/ML PO CONC: 0.5000 mg | ORAL | 0 refills | Status: AC | PRN

## 2020-02-05 MED ORDER — GLYCOPYRROLATE 0.2 MG/ML IJ SOLN: 0.2000 mg | INTRAMUSCULAR | Status: AC | PRN

## 2020-02-05 MED ORDER — SODIUM CHLORIDE 0.9% FLUSH: 3.0000 mL | INTRAVENOUS | Status: DC | PRN

## 2020-02-05 MED ORDER — POLYVINYL ALCOHOL 1.4 % OP SOLN: 1.0000 [drp] | Freq: Four times a day (QID) | OPHTHALMIC | Status: DC | PRN

## 2020-02-05 MED ORDER — SODIUM CHLORIDE 0.9 % IV SOLN: 250.0000 mL | INTRAVENOUS | Status: DC | PRN

## 2020-02-05 MED ORDER — GLYCOPYRROLATE 0.2 MG/ML IJ SOLN: 0.2000 mg | INTRAMUSCULAR | Status: DC | PRN

## 2020-02-05 MED ORDER — METOPROLOL TARTRATE 12.5 MG HALF TABLET
12.5000 mg | ORAL_TABLET | Freq: Two times a day (BID) | ORAL | Status: DC
  Administered 2020-02-05: 12.5 mg via ORAL
  Filled 2020-02-05: qty 1

## 2020-02-05 MED ORDER — LORAZEPAM 0.5 MG PO TABS: 0.2500 mg | ORAL_TABLET | Freq: Four times a day (QID) | ORAL | Status: DC | PRN

## 2020-02-05 MED ORDER — SODIUM CHLORIDE 0.9 % IV SOLN
1.0000 g | Freq: Three times a day (TID) | INTRAVENOUS | Status: DC
  Administered 2020-02-05: 1 g via INTRAVENOUS
  Filled 2020-02-05 (×3): qty 1

## 2020-02-05 MED ORDER — MORPHINE SULFATE (CONCENTRATE) 10 MG/0.5ML PO SOLN: 2.5000 mg | ORAL | Status: AC | PRN

## 2020-02-05 NOTE — Progress Notes (Signed)
Chaplain responded to Spiritual Consult to support patient and his family as he transitions to [place].  Chaplain met patient and his daughter.  Patient's wife was out of the room getting report of how her sister's heart surgery had gone today.  Chaplain met wife in waiting area outside of unit.  Chaplain offered a ministry of presence, hearing concerns Mrs. Copenhaver has about her husband not being able to come home.  He had been in perfect health until January of this year.  And now he is going to [place]!  She shared sorrow over hearing her husband has an incurable disease.  She shared her feelings of guilt over not being able to care for him at home.  Chaplain offered consolation and confirmation that most everyone would feel the same way in a similar situation. Mrs. Dye expressed her conviction that the [facility]ita Cliche will get them through this.  Chaplain affirmed Mrs. Nadeem's faith.  Chaplain assisted Mrs. Inghram in getting in touch with Hospice and arranging to go meet them today.  Chaplain escorted Mrs. Holben back to her husband's room where she will meet her daughter who will take her to Hospice to fill out necessary paperwork.  Chaplain recommended Mrs. Bahena share her concerns with the Hospice staff as they will be able to comfort her and advise her.    Chaplain available to assist in whatever way possible at any time.  De Burrs Chaplain Resident

## 2020-02-05 NOTE — Progress Notes (Signed)
Pt speech extremely garbled and lung sounds are crackled. Pt educated to cough and deep breathe and was assisted in incentive spirometer use. Dr. Verlon Au called RN with orders to wean down on 02, give Metoprolol Xl early this am, and maintain pt NPO with med exception. Pt weaned from 10L to 8L and initially tolerated well, maintaining in low to mid 90's. After taking 3 small pills with sips, pt sats decreased to low to mid 80's. Wave form not reliable and 02 censor was changed. Pt stayed in mid 80's to low 90's despite censor change and 02 adjusted to 12L. RT consulted to give PRN breathing treatment and assess for further interventions.   Justice Rocher, RN

## 2020-02-05 NOTE — Progress Notes (Signed)
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 Joya Salm HTN PAD seizure disorder Paroxysmal A. fib flutter Mobitz second-degree (not a candidate for anticoagulation)  after hospitalization January 2021 was supposed to go home with hospice He was being followed by  hospice in the outpatient setting  Presented back to ED 02/01/2020 with generalized weakness and hypotension after having a fall had fractured his right  wrist and the splint was placed on the hand P.o. has diminished significantly over several days with reported fevers Ultimately found to have sepsis with aspiration pneumonia in the right lower lobe patient was given IV fluids because of hypotension protocol initiated antibiotics were adjusted  Assessment & Plan:   Principal Problem:   Sepsis ([place]) Active Problems:   PAD (peripheral artery disease) ([place])   Essential hypertension   Aspiration pneumonia ([place])   DNR (do not resuscitate)   Adult failure to thrive   Paroxysmal A-fib ([place])   Atrial flutter ([place])   Palliative care encounter   1. Mobitz one atrial flutter/Sinus tach CHADS2 score >4 not a anticoagulation candidate with tachycardia on 7/29 a. Not a candidate for anticoagulation given high risk of bleeding has bled score >4 b. adjusted metoprolol to 25 twice daily-if continues to be tachycardic may need to load with amiodarone with an oral load c. Add on magnesium to ensure no reversible issue 2. Worsening acute hypoxic respiratory failure a. Patient's oxygen levels are diminished and needed high flow oxygen overnight b. and needed high flow cannula c. Will also require RT input 3. ?  Lung  abscess and necrotizing pneumonia a. CXR portable suggests mass b. CT scan shows necrotizing pneumonia c. I have changed his Augmentin which was started 1 to 2 days ago to Primaxin d. It is hopeful that this will resolve the issues however unlikely given chronic aspiration e. He will be kept n.p.o. until speech can reeval except for sips with meds f. He reiterates he wishes to be full code despite my long discussion with him this morning 4. Mild sepsis secondary to likely aspiration a. Patient noncompliant with diet and that was reinforced today but remains unfortunately high aspiration risk b. Plavix adjusted as above c. Speech to re-visit 5. Right wrist fracture status post evaluation Dr. Margaretmary Lombard in sugar tong splint a. Requires total assist according to OT with regards to safety b. Orthopedic hand surgeon expects follow-up in several weeks in the office with conservative management c. Oral opiates D escalated and placed on Toradol only at this time 6. Prior craniotomy a. Some cognitive deficits likely secondary to this along with associated seizures b. Continue Tegretol 400 twice daily aspirin 81 7. HTN a. Moderately controlled pressures at this time b. Discontinue amlodipine in favor of metoprolol as above  8. PAD a. Continue aspirin as above 9. Seizures 10. CVA  DVT prophylaxis: Heparin Code Status: DNR Family Communication: Palliative care and I discussed the case-I will talk with Mortimer Fries later today if questions however he has a very poor prognosis and is DNR-best options unfortunately seem to be comfort trajectory, Probably with freestanding hospice placement We will visit this discussion going forward  Disposition:   Status is: Inpatient  Remains inpatient appropriate because:Unsafe d/c plan   Dispo: The  patient is from: Home              Anticipated d/c is to: SNF              Anticipated d/c date is: 1 day if medically stable and no further tachyarrhythmia               Patient currently is not medically stable to d/c.   Consultants:   Orthopedics  Procedures: Splinting  Antimicrobials: Currently Augmentin transitioned back to Primaxin   Subjective: Overnight events noted Now on high flow cannula Nursing reports tachycardia in addition to desats to the 80s with sips of meds despite high oxygen flow He clearly seems to be aspirating  Objective: Vitals:   02/05/20 0500 02/05/20 0512 02/05/20 0804 02/05/20 0844  BP: 105/66  (!) 136/76 (!) 136/76  Pulse: (!) 113  (!) 109 (!) 109  Resp: (!) 24  19 (!) 24  Temp: 98.3 F (36.8 C)  98.5 F (36.9 C)   TempSrc:   Oral   SpO2: (!) 86% (!) 89% 95% 95%  Weight:      Height:        Intake/Output Summary (Last 24 hours) at 02/05/2020 1002 Last data filed at 02/05/2020 0300 Gross per 24 hour  Intake 720 ml  Output 1150 ml  Net -430 ml   Filed Weights   02/02/20 0340 02/04/20 0500 02/05/20 0431  Weight: 73.4 kg 73.4 kg 73.5 kg    Examination:  General exam: EOMI NCAT mild cardiorespiratory embarrassment Respiratory system: Clinically clear no added rales rhonchi Cardiovascular system: S1-S2 predominant sinus tachycardia with PVCs today Gastrointestinal system: Soft nontender no rebound. Central nervous system: Neurologically intact moving 4 limbs equally poor dentition smile symmetric ROM intact Extremities: ROM intact Skin: Soft supple Psychiatry: Euthymic but occasionally confused  Data Reviewed: I have personally reviewed following labs and imaging studies CO2 to 24 WBC maintaining at 15.8-->17.6 Hemoglobin 11--10.2 Radiology Studies: CT CHEST W CONTRAST  Result Date: 02/04/2020 CLINICAL DATA:  Increasing right-sided infiltrative opacity EXAM: CT CHEST WITH CONTRAST TECHNIQUE: Multidetector CT imaging of the chest was performed during intravenous contrast administration. CONTRAST:  36mL OMNIPAQUE IOHEXOL 300 MG/ML  SOLN COMPARISON:  Plain film from earlier in the same day.  FINDINGS: Cardiovascular: Thoracic aorta demonstrates atherosclerotic calcifications without aneurysmal dilatation or dissection. Pulmonary artery shows no definitive central pulmonary embolus although timing was not performed for embolus evaluation. No significant cardiac enlargement is noted. No pericardial effusion is seen. Mild narrowing of the proximal left subclavian artery is noted. Mediastinum/Nodes: Thoracic inlet is within normal limits. Scattered mediastinal lymph nodes are seen. The largest of these lies in the subcarinal region measuring 14 mm in short axis. Scattered small hilar nodes are noted as well. The esophagus is within normal limits. Lungs/Pleura: Diffuse emphysematous changes are noted. Apical scarring is noted on the right. New consolidation is noted within the right lower lobe medially. This corresponds to the new density seen on recent chest x-ray. Central areas of decreased attenuation are noted consistent with necrosis. Small right pleural effusion is noted. Left lower lobe infiltrate is noted as well without significant effusion. No sizable parenchymal nodules are noted. Upper Abdomen: Upper abdomen shows no acute abnormality Musculoskeletal: Degenerative changes of the thoracic spine are noted. Old rib fractures are seen bilaterally. Chronic compression deformity of T12. IMPRESSION: Bilateral lower lobe infiltrates worse on the right than the left. The consolidation medially in the right base shows central necrosis. This corresponds to that  seen on recent plain film examination and given its quick onset is felt to be related to acute pneumonic infiltrate. Reactive lymphadenopathy is noted within the hila and mediastinum. Small right pleural effusion. Left subclavian artery narrowing at the origin Aortic Atherosclerosis (ICD10-I70.0) and Emphysema (ICD10-J43.9). Electronically Signed   By: Inez Catalina M.D.   On: 02/04/2020 22:54   DG CHEST PORT 1 VIEW  Result Date:  02/04/2020 CLINICAL DATA:  Follow-up pneumonia. EXAM: PORTABLE CHEST 1 VIEW COMPARISON:  02/01/2020 FINDINGS: The heart size is normal. Aortic atherosclerosis. Small to moderate bilateral pleural effusions are increased from previous exam. Within the right hilar region there is a suggestion of an underlying mass. Worsening aeration to the right lower lobe is identified with increasing airspace opacification. IMPRESSION: 1. Worsening aeration to the right lower lobe with increasing bilateral pleural effusions. 2. Suspect underlying right hilar mass. Further evaluation with contrast-enhanced CT chest is advised. These results will be called to the ordering clinician or representative by the Radiologist Assistant, and communication documented in the PACS or [employer]. Electronically Signed   By: Kerby Moors M.D.   On: 02/04/2020 11:53     Scheduled Meds: . carbamazepine  400 mg Oral BID  . heparin  5,000 Units Subcutaneous Q8H  . insulin aspart  0-6 Units Subcutaneous TID WC  . metoprolol tartrate  5 mg Intravenous Q6H  . metoprolol tartrate  12.5 mg Oral BID   Continuous Infusions: . meropenem (MERREM) IV       LOS: 4 days    Time spent: [employer], MD [facility] To contact the attending provider between 7A-7P or the covering provider during after hours 7P-7A, please log into the web site [email or web address] and access using universal [place] password for that web site. If you do not have the password, please call the hospital operator.  02/05/2020, 10:02 AM

## 2020-02-05 NOTE — Social Work (Signed)
CSW consulted for [facility] referral.   Malva Limes of [facility], confirmed she is aware of the referral and will advise TOC team once she has been updated from [facility].  Thurmond Butts, MSW, [place] Clinical Social Worker

## 2020-02-05 NOTE — Progress Notes (Signed)
SLP Cancellation Note  Patient Details Name: Richard Mitchell MRN: [id number] DOB: 1941/07/05   Cancelled treatment:    Pt is transitioning to comfort care.  Our service will respectfully sign off.  Timmya Blazier L. Tivis Ringer, [place] CCC/SLP Acute Rehabilitation Services Office number [phone] Pager [phone]        Juan Quam Laurice 02/05/2020, 2:44 PM

## 2020-02-05 NOTE — Progress Notes (Signed)
Daily Progress Note   Patient Name: Richard Mitchell       Date: 02/05/2020 DOB: 07-01-1941  Age: 79 y.o. MRN#: [id number] Attending Physician: Nita Sells, MD Primary Care Physician: Marton Redwood, MD Admit Date: 02/01/2020  Reason for Consultation/Follow-up:  To discuss complex medical decision making related to patient's goals of care  Received call from Dr. Verlon Au to meet with patient this morning due to new findings and decline. Patient is now in aflutter with very difficult to control rate. CT scan 7/29 shows bilateral lower lobe infiltrates R>L.  New right medial consolidation has central necrosis.  Subjective: Patient wants a drink of water.  We talked about his swallowing (even his own saliva) making his pneumonia worse.  I explained that this advanced, recurrent aspiration pneumonia is a terminal illness.   Richard Mitchell tells me he has met his maker and he is good with it.  He even tells me a story about his guardian angel being with him when he had his craniotomy.   I talked with him about doing things to make him comfortable but avoiding things that would harm him like chest compressions and intubation.   I explained DNR.  Richard Mitchell is in agreement.   I recommended [facility] and he is in agreement.  I called his wife Richard Mitchell on speaker phone.  She is on her way to the hospital after dog sitting overnight.  [name]'s sister is having open heart surgery today - she has quite a lot of stress on her at the moment.      We discussed the terminal natural of recurrent aspiration pneumonia.  Richard Mitchell is in agreement with allowing Richard Mitchell to eat and drink as he wants regardless of the aspiration risk.  We discussed Hospice care and Hospice facility.  Richard Mitchell asks about expense associated with Hospice  facility.  I explained that it is based on [name]'s income and Hospice does not want cost to be a barrier.  I encouraged her to speak with Hospice directly about expenses.    We discussed that [place] [facility] is a place where people go to have a very pleasant comfortable passing.  Richard Mitchell and Richard Mitchell understand and are in agreement with going to [facility].  Richard Mitchell did have a difficult time hearing all of this for the 1st time.  Placed TOC  order.  Communicated with Dr. Verlon Au.  Discussed with Farrel Gordon of [facility].   Assessment: Very pleasant 79 yo gentleman and his wife.  Recurrent aspiration.  Now with bilateral pneumonia with necrosis.     Patient Profile/HPI:  79 y.o. male  with past medical history of CVA, SDH s/p craniotomy (1998), PAD, seizure disorder, dysphagia, afib with mobitz type two block and recurrent falls who was admitted on 02/01/2020 with right lower lobe pneumonia suspicious for aspiration pneumonia.  At the time of admission he was septic with hypotension and heart rate in the 120s.  [name] fell and broke his right wrist just 3 days ago.  He was seen in the ER at the time.  This admission Dr. Amedeo Plenty was consulted for the wrist and recommended conservative management with a sugar tong cast.  [name] was scene by [facility] PMT earlier this year.  A MOST form was completed.  The patient selected limited interventions and no feeding tube.  He is followed regularly [facility] Palliative [facility].   Length of Stay: 4   Vital Signs: BP 105/66   Pulse (!) 113   Temp 98.3 F (36.8 C)   Resp (!) 24   Ht '6\' 2"'  (1.88 m)   Wt 73.5 kg   SpO2 (!) 89%   BMI 20.80 kg/m  SpO2: SpO2: (!) 89 % O2 Device: O2 Device: High Flow Nasal Cannula O2 Flow Rate: O2 Flow Rate (L/min): 12 L/min       Palliative Assessment/Data: 20%     Palliative Care Plan    Recommendations/Plan:  Will shift to comfort measures, but continue antibiotics until discharge.  Patient and his wife agree  with DNR  Patient and his wife agree with eat/drinking for pleasure despite risk of aspiration.  DC to [place] when be is available.  Code Status:  DNR  Prognosis:   < 2 weeks secondary to severe recurrent aspiration pneumonia with necrosis.   Discharge Planning:  Hospice facility  Care plan was discussed with patient, wife, MD, Hospice.  Thank you for allowing the Palliative Medicine Team to assist in the care of this patient.  Total time spent:  35 min.     Greater than 50%  of this time was spent counseling and coordinating care related to the above assessment and plan.  Florentina Jenny, PA-C Palliative Medicine  Please contact Palliative MedicineTeam phone at [phone] for questions and concerns between 7 am - 7 pm.   Please see AMION for individual provider pager numbers.

## 2020-02-05 NOTE — Progress Notes (Signed)
RN called respiratory to bedside due to patients stats dropping down below 85%, despite increasing oxygen from 4L to 10L.  Respiratory assessed and placed patient on a high flow [place] at a rate of 10L of oxygen.  Patient currently stating between 88%-90% at this time.  Paged on call for additional orders.

## 2020-02-05 NOTE — TOC Transition Note (Signed)
Transition of Care [facility]) - CM/SW Discharge Note   Patient Details  Name: Richard Mitchell MRN: [id number] Date of Birth: 1940/11/22  [facility] of [facility] CM/SW Contact:  Vinie Sill, [place] Phone Number: 02/05/2020, 4:28 PM   Clinical Narrative:     Patient will DC to: [place] Date: 02/05/2020 Family Notified: [name] Transport By: Corey Harold   Per MD patient is ready for discharge. RN, patient, and facility notified of DC. Discharge Summary sent to facility. RN given number for report [phone]. Ambulance transport requested for patient.   Clinical Social Worker signing off.  Thurmond Butts, MSW, [place] Clinical Social Worker    Final next level of care: [place] Barriers to Discharge: Barriers Resolved   Patient Goals and CMS Choice        Discharge Placement              Patient chooses bed at:  [facility]) Patient to be transferred to facility by: [place] Name of family member notified: [name] Patient and family notified of of transfer: 02/05/20  Discharge Plan and Services                                     Social Determinants of Health (SDOH) Interventions     Readmission Risk Interventions No flowsheet data found.

## 2020-02-05 NOTE — Progress Notes (Addendum)
[facility] ([facility]) Hospital Liaison note.    Received request from TOC manager for family interest in [facility]. Chart reviewed and eligibility confirmed. Spoke with family to confirm interest and explain services. Family agreeable to transfer today. TOC aware.     [facility] will notify TOC when registration paperwork has been completed to arrange transport.    RN please call report to [phone].   Thank you,      Mary Anne Robertson, RN, CCM       [facility] Hospital Liaison (listed on AMION under Hospice/[facility])     [phone]  

## 2020-02-05 NOTE — Progress Notes (Signed)
Pt discharged to [facility]. Report was called to RN there and 2 PIV's left intact per her request. Room belongings were packed and AVS and DNR were sent with transport.   Justice Rocher, RN

## 2020-02-05 NOTE — Discharge Summary (Signed)
Physician Discharge Summary  Richard Mitchell WNI:[id number] DOB: [birth year] DOA: 02/01/2020  PCP: Marton Redwood, MD  Admit date: 02/01/2020 Discharge date: 02/05/2020  Time spent: 17 minutes  Recommendations for Outpatient Follow-up:  1. Patient being discharged freestanding hospice for end-of-life care DNR signed  Discharge Diagnoses:  Principal Problem:   Sepsis ([place]) Active Problems:   PAD (peripheral artery disease) ([place])   Essential hypertension   Aspiration pneumonia ([place])   DNR (do not resuscitate)   Adult failure to thrive   Paroxysmal A-fib ([place])   Atrial flutter ([place])   Palliative care encounter   Encounter for hospice care discussion   Discharge Condition: Guarded  Diet recommendation: Comfort  Filed Weights   02/02/20 0340 02/04/20 0500 02/05/20 0431  Weight: 73.4 kg 73.4 kg 73.5 kg    History of present illness:  79 year old white male multiple comorbid's and recurrent aspiration since January of this year multiple issues with 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 fib not on anticoagulation multiple subdurals last operated on 2004 HTN PAD seizure secondary to the same readmitted with weakness hypotension right wrist fracture and ultimately developed sepsis secondary to aspiration pneumonia which was recurrent Palliative care was consulted for goals of care and decision was made to transition to hospice at hospice facility secondary to worsening parameters in addition to atrial fibrillation and necrotic pneumonia in the left lung based on CT scan that was performed on 7/29 We appreciate input from Dr. Amedeo Plenty of hand surgery who splinted the patient He will obtain further management and care of end-of-life with the hospice facility-I will not be discharging him on any antibiotics however we will try to control his pain and suffering with usual meds such as Roxanol, glycopyrrolate Haldol and others   Discharge Exam: Vitals:   02/05/20 0804 02/05/20 0844  BP:  (!) 136/76 (!) 136/76  Pulse: (!) 109 (!) 109  Resp: 19 (!) 24  Temp: 98.5 F (36.9 C)   SpO2: 95% 95%    General: Awake alert coherent emaciated Cardiovascular: S1-S2 sinus tachycardia/paroxysmal A. fib Respiratory: Faint rales rhonchi posterolaterally in the lung fields Abdomen soft no rebound  Right arm in sugar tong splint  Discharge Instructions   Discharge Instructions    Diet - low sodium heart healthy   Complete by: As directed    Increase activity slowly   Complete by: As directed      Allergies as of 02/05/2020   No Known Allergies     Medication List    STOP taking these medications   acetaminophen 500 MG tablet Commonly known as: TYLENOL   aspirin 81 MG tablet   CLEAR EYES FOR DRY EYES OP   rosuvastatin 20 MG tablet Commonly known as: CRESTOR   Vitamin D (Ergocalciferol) 1.25 MG (50000 UNIT) Caps capsule Commonly known as: DRISDOL     TAKE these medications   carbamazepine 200 MG tablet Commonly known as: TEGRETOL Take 400 mg by mouth 2 (two) times daily.   glycopyrrolate 0.2 MG/ML injection Commonly known as: ROBINUL Inject 1 mL (0.2 mg total) into the skin every 4 (four) hours as needed (excessive secretions).   haloperidol 2 MG/ML solution Commonly known as: HALDOL Place 0.3 mLs (0.6 mg total) under the tongue every 4 (four) hours as needed for agitation (or delirium).   LORazepam 0.5 MG tablet Commonly known as: ATIVAN Take 0.5 tablets (0.25 mg total) by mouth every 6 (six) hours as needed for anxiety or sleep.   metoprolol tartrate 25 MG  tablet Commonly known as: LOPRESSOR Take 0.5 tablets (12.5 mg total) by mouth 2 (two) times daily.   morphine CONCENTRATE 10 MG/0.5ML Soln concentrated solution Take 0.13 mLs (2.6 mg total) by mouth every 2 (two) hours as needed for moderate pain, anxiety or shortness of breath (elevated heart rate).      No Known Allergies    The results of significant diagnostics from this hospitalization  (including imaging, microbiology, ancillary and laboratory) are listed below for reference.    Significant Diagnostic Studies: DG Wrist Complete Right  Result Date: 01/31/2020 CLINICAL DATA:  RIGHT wrist pain after catching a fall in the bathroom, constant pain with swelling, limited range of motion EXAM: RIGHT WRIST - COMPLETE 3+ VIEW COMPARISON:  None FINDINGS: Osseous demineralization. Joint space narrowing at STT joint and third MCP joint. Displaced fracture of the distal RIGHT ulnar metadiaphysis. No additional fracture, dislocation, or bone destruction. Small vessel vascular calcifications. IMPRESSION: Displaced RIGHT ulnar metadiaphyseal fracture. Osseous demineralization with scattered degenerative changes. Electronically Signed   By: Lavonia Dana M.D.   On: 01/31/2020 13:47   CT CHEST W CONTRAST  Result Date: 02/04/2020 CLINICAL DATA:  Increasing right-sided infiltrative opacity EXAM: CT CHEST WITH CONTRAST TECHNIQUE: Multidetector CT imaging of the chest was performed during intravenous contrast administration. CONTRAST:  82mL OMNIPAQUE IOHEXOL 300 MG/ML  SOLN COMPARISON:  Plain film from earlier in the same day. FINDINGS: Cardiovascular: Thoracic aorta demonstrates atherosclerotic calcifications without aneurysmal dilatation or dissection. Pulmonary artery shows no definitive central pulmonary embolus although timing was not performed for embolus evaluation. No significant cardiac enlargement is noted. No pericardial effusion is seen. Mild narrowing of the proximal left subclavian artery is noted. Mediastinum/Nodes: Thoracic inlet is within normal limits. Scattered mediastinal lymph nodes are seen. The largest of these lies in the subcarinal region measuring 14 mm in short axis. Scattered small hilar nodes are noted as well. The esophagus is within normal limits. Lungs/Pleura: Diffuse emphysematous changes are noted. Apical scarring is noted on the right. New consolidation is noted within the  right lower lobe medially. This corresponds to the new density seen on recent chest x-ray. Central areas of decreased attenuation are noted consistent with necrosis. Small right pleural effusion is noted. Left lower lobe infiltrate is noted as well without significant effusion. No sizable parenchymal nodules are noted. Upper Abdomen: Upper abdomen shows no acute abnormality Musculoskeletal: Degenerative changes of the thoracic spine are noted. Old rib fractures are seen bilaterally. Chronic compression deformity of T12. IMPRESSION: Bilateral lower lobe infiltrates worse on the right than the left. The consolidation medially in the right base shows central necrosis. This corresponds to that seen on recent plain film examination and given its quick onset is felt to be related to acute pneumonic infiltrate. Reactive lymphadenopathy is noted within the hila and mediastinum. Small right pleural effusion. Left subclavian artery narrowing at the origin Aortic Atherosclerosis (ICD10-I70.0) and Emphysema (ICD10-J43.9). Electronically Signed   By: Inez Catalina M.D.   On: 02/04/2020 22:54   DG CHEST PORT 1 VIEW  Result Date: 02/04/2020 CLINICAL DATA:  Follow-up pneumonia. EXAM: PORTABLE CHEST 1 VIEW COMPARISON:  02/01/2020 FINDINGS: The heart size is normal. Aortic atherosclerosis. Small to moderate bilateral pleural effusions are increased from previous exam. Within the right hilar region there is a suggestion of an underlying mass. Worsening aeration to the right lower lobe is identified with increasing airspace opacification. IMPRESSION: 1. Worsening aeration to the right lower lobe with increasing bilateral pleural effusions. 2. Suspect underlying right hilar  mass. Further evaluation with contrast-enhanced CT chest is advised. These results will be called to the ordering clinician or representative by the Radiologist Assistant, and communication documented in the PACS or [employer]. Electronically Signed   By:  Kerby Moors M.D.   On: 02/04/2020 11:53   DG Chest Port 1 View  Result Date: 02/01/2020 CLINICAL DATA:  Sepsis, hypoxia EXAM: PORTABLE CHEST 1 VIEW COMPARISON:  10/05/2019 FINDINGS: Progression of right lower lobe airspace disease which could be pneumonia. Mild left lower lobe airspace disease unchanged. COPD. Negative for heart failure. Probable small bilateral pleural effusions. IMPRESSION: Progression of right lower lobe airspace disease, probable pneumonia Electronically Signed   By: Franchot Gallo M.D.   On: 02/01/2020 14:17    Microbiology: Recent Results (from the past 240 hour(s))  Blood Culture (routine x 2)     Status: None (Preliminary result)   Collection Time: 02/01/20  2:24 PM   Specimen: BLOOD LEFT HAND  Result Value Ref Range Status   Specimen Description BLOOD LEFT HAND  Final   Special Requests   Final    BOTTLES DRAWN AEROBIC AND ANAEROBIC Blood Culture adequate volume   Culture   Final    NO GROWTH 4 DAYS Performed at [facility], [place] [address], [place], [place] [address]    Report Status PENDING  Incomplete  Blood Culture (routine x 2)     Status: None (Preliminary result)   Collection Time: 02/01/20  2:32 PM   Specimen: BLOOD  Result Value Ref Range Status   Specimen Description BLOOD LEFT ANTECUBITAL  Final   Special Requests   Final    BOTTLES DRAWN AEROBIC AND ANAEROBIC Blood Culture adequate volume   Culture   Final    NO GROWTH 4 DAYS Performed at [facility], [place] [address] [place], [place] [address]    Report Status PENDING  Incomplete  SARS Coronavirus 2 by RT PCR (hospital order, performed in [facility] hospital lab) Nasopharyngeal Nasopharyngeal Swab     Status: None   Collection Time: 02/01/20  2:37 PM   Specimen: Nasopharyngeal Swab  Result Value Ref Range Status   SARS Coronavirus 2 NEGATIVE NEGATIVE Final    Comment: (NOTE) SARS-CoV-2 target nucleic acids are NOT DETECTED.  The SARS-CoV-2 RNA is generally detectable in  upper and lower respiratory specimens during the acute phase of infection. The lowest concentration of SARS-CoV-2 viral copies this assay can detect is 250 copies / mL. A negative result does not preclude SARS-CoV-2 infection and should not be used as the sole basis for treatment or other patient management decisions.  A negative result may occur with improper specimen collection / handling, submission of specimen other than nasopharyngeal swab, presence of viral mutation(s) within the areas targeted by this assay, and inadequate number of viral copies (<250 copies / mL). A negative result must be combined with clinical observations, patient history, and epidemiological information.  Fact Sheet for Patients:   StrictlyIdeas.no  Fact Sheet for Healthcare Providers: BankingDealers.co.za  This test is not yet approved or  cleared by the [place] FDA and has been authorized for detection and/or diagnosis of SARS-CoV-2 by FDA under an Emergency Use Authorization (EUA).  This EUA will remain in effect (meaning this test can be used) for the duration of the COVID-19 declaration under Section 564(b)(1) of the Act, 21 U.S.C. section 360bbb-3(b)(1), unless the authorization is terminated or revoked sooner.  Performed at [facility], [place] [address],  [place], [place] [address]   Urine culture     Status: None   Collection Time: 02/01/20  2:38 PM   Specimen: In/Out Cath Urine  Result Value Ref Range Status   Specimen Description IN/OUT CATH URINE  Final   Special Requests NONE  Final   Culture   Final    NO GROWTH Performed at [facility], [place] [address], [place], [place] [address]    Report Status 02/02/2020 FINAL  Final  SARS CORONAVIRUS 2 (TAT 6-24 HRS) Nasopharyngeal Nasopharyngeal Swab     Status: None   Collection Time: 02/04/20  1:00 AM   Specimen: Nasopharyngeal Swab  Result Value Ref Range Status   SARS  Coronavirus 2 NEGATIVE NEGATIVE Final    Comment: (NOTE) SARS-CoV-2 target nucleic acids are NOT DETECTED.  The SARS-CoV-2 RNA is generally detectable in upper and lower respiratory specimens during the acute phase of infection. Negative results do not preclude SARS-CoV-2 infection, do not rule out co-infections with other pathogens, and should not be used as the sole basis for treatment or other patient management decisions. Negative results must be combined with clinical observations, patient history, and epidemiological information. The expected result is Negative.  Fact Sheet for Patients: SugarRoll.be  Fact Sheet for Healthcare Providers: https://www.woods-mathews.com/  This test is not yet approved or cleared by the [place] FDA and  has been authorized for detection and/or diagnosis of SARS-CoV-2 by FDA under an Emergency Use Authorization (EUA). This EUA will remain  in effect (meaning this test can be used) for the duration of the COVID-19 declaration under Se ction 564(b)(1) of the Act, 21 U.S.C. section 360bbb-3(b)(1), unless the authorization is terminated or revoked sooner.  Performed at [facility], [place] [address] [place], [place] [address]      Labs: Basic Metabolic Panel: Recent Labs  Lab 02/01/20 1433 02/01/20 1433 02/01/20 1614 02/02/20 0441 02/03/20 0945 02/04/20 0355 02/05/20 0330  NA 136  --   --  140 137 138 138  K 3.6  --   --  3.2* 3.8 3.7 3.8  CL 99  --   --  109 105 106 104  CO2 25  --   --  24 21* 23 24  GLUCOSE 119*  --   --  108* 114* 100* 91  BUN 11  --   --  7* <5* 5* <5*  CREATININE 0.97   < > 0.84 0.72 0.64 0.66 0.68  CALCIUM 8.9  --   --  7.6* 7.9* 7.9* 8.1*  MG  --   --   --  2.0 2.0 2.0 2.0   < > = values in this interval not displayed.   Liver Function Tests: Recent Labs  Lab 02/01/20 1433  AST 69*  ALT 40  ALKPHOS 216*  BILITOT 0.9  PROT 7.4  ALBUMIN 3.2*   No  results for input(s): LIPASE, AMYLASE in the last 168 hours. No results for input(s): AMMONIA in the last 168 hours. CBC: Recent Labs  Lab 02/01/20 1433 02/01/20 1433 02/01/20 1614 02/02/20 0441 02/03/20 0945 02/04/20 0355 02/05/20 0330  WBC 18.8*   < > 17.3* 12.8* 15.1* 15.8* 17.6*  NEUTROABS 16.2*  --   --   --   --   --   --   HGB 12.6*   < > 10.0* 9.4* 11.0* 9.9* 10.2*  HCT 39.8   < > 30.6* 29.8* 34.8* 30.1* 31.9*  MCV 92.8   < > 91.6 92.0 93.8  91.2 92.2  PLT 268   < > 242 207 253 264 300   < > = values in this interval not displayed.   Cardiac Enzymes: No results for input(s): CKTOTAL, CKMB, CKMBINDEX, TROPONINI in the last 168 hours. BNP: BNP (last 3 results) Recent Labs    02/01/20 1700  BNP 420.0*    ProBNP (last 3 results) No results for input(s): PROBNP in the last 8760 hours.  CBG: Recent Labs  Lab 02/04/20 0838 02/04/20 1218 02/04/20 1710 02/04/20 2109 02/05/20 0807  GLUCAP 105* 107* 105* 98 99       Signed:  Nita Sells MD   [facility] 02/05/2020, 3:03 PM

## 2020-02-06 LAB — CULTURE, BLOOD (ROUTINE X 2)
Culture: NO GROWTH
Culture: NO GROWTH
Special Requests: ADEQUATE
Special Requests: ADEQUATE

## 2020-02-19 ENCOUNTER — Ambulatory Visit: Payer: PPO | Admitting: Podiatry

## 2020-03-09 DEATH — deceased

## 2021-02-23 IMAGING — DX DG CHEST 1V PORT
2 series · 2 of 2 positions shown · non-contrast
Comparison: Chest radiograph October 12, 2009 and chest CT March 29, 2010

CLINICAL DATA: Pain following fall

EXAM:
PORTABLE CHEST 1 VIEW

[chest ap (1 of 2)]
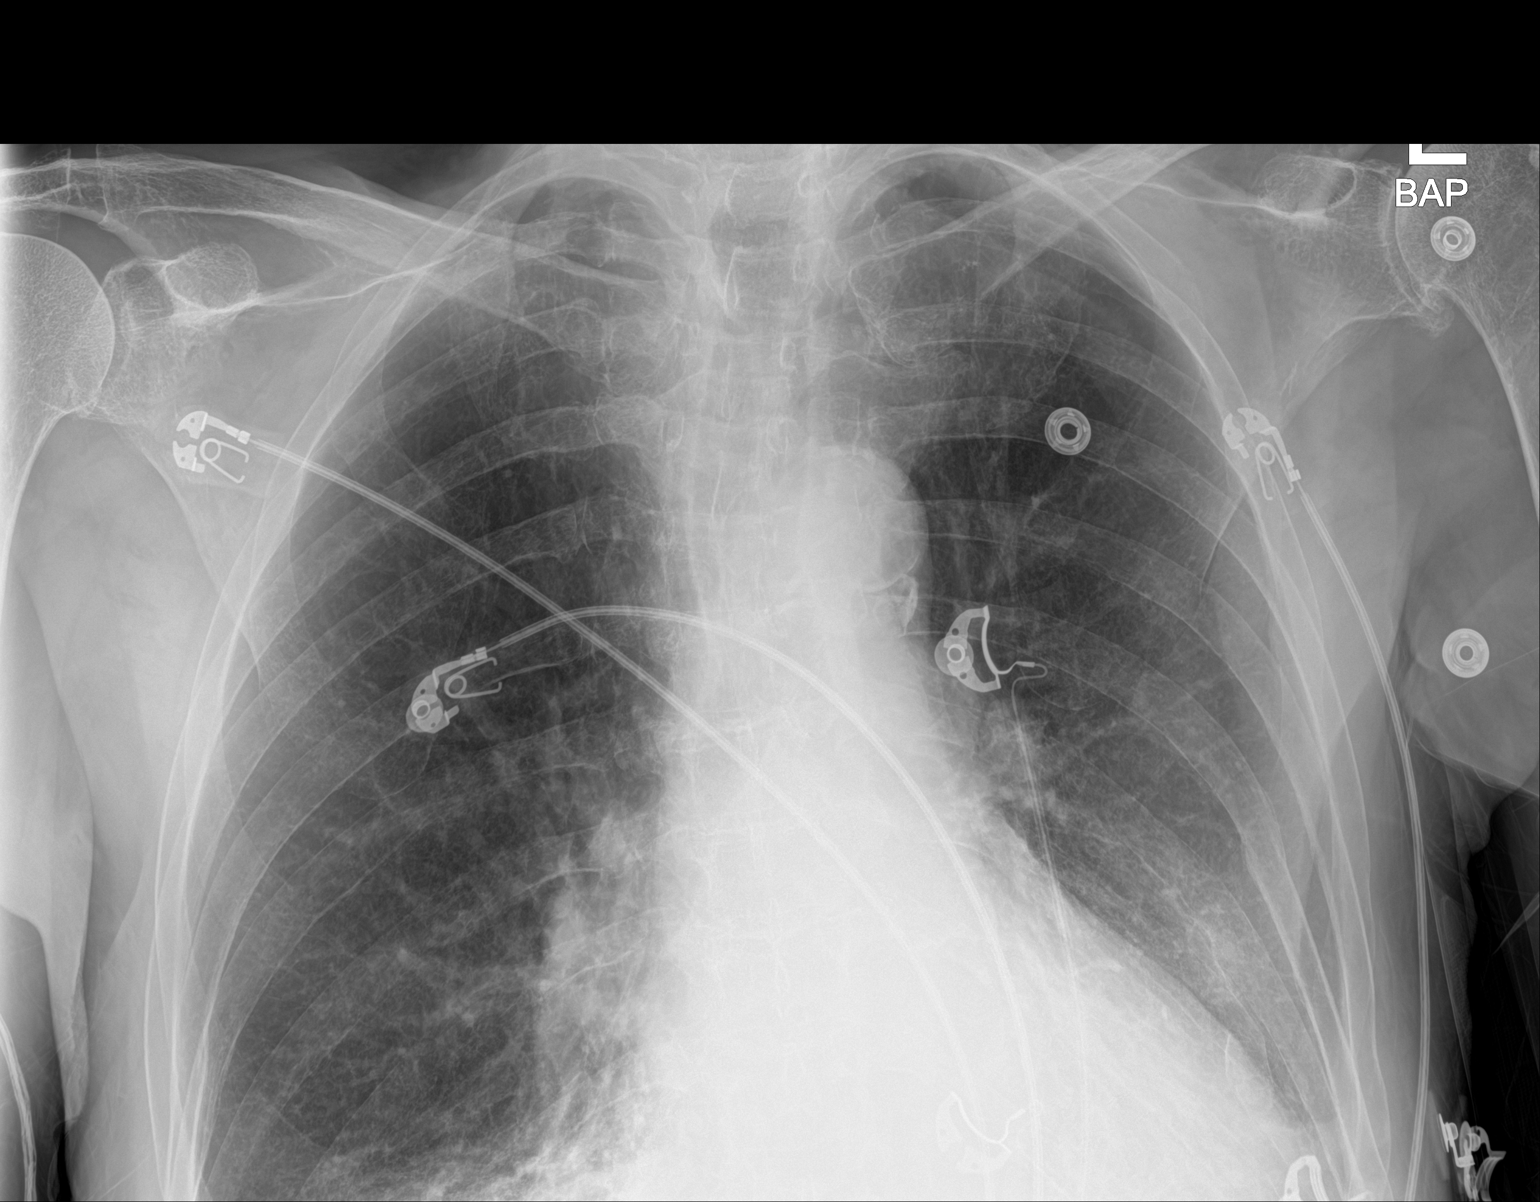

[chest ap (2 of 2)]
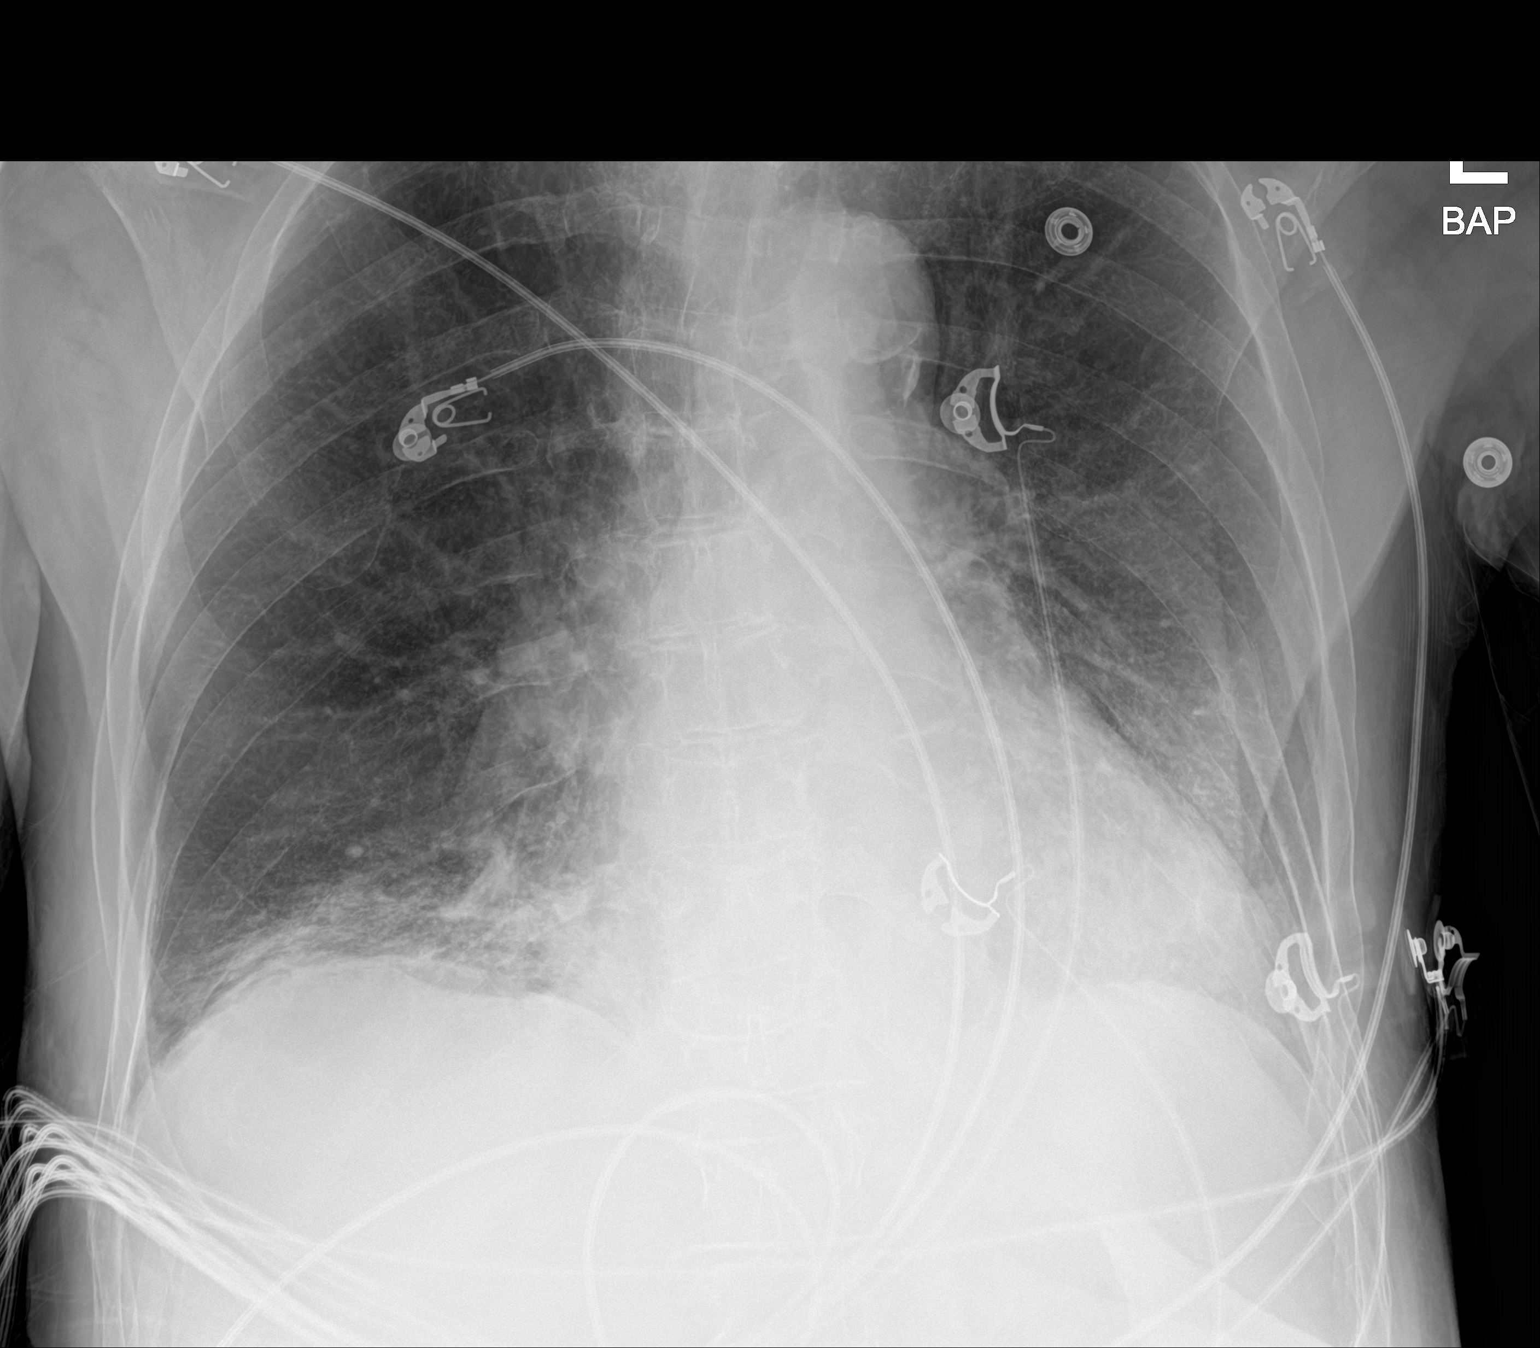

[2 of 2 positions shown; findings below may reference images not displayed]

FINDINGS: There is patchy airspace opacity in the right base. There is a
degree of underlying emphysematous change, better seen on prior CT.
Heart is mildly enlarged with pulmonary vascularity normal. No
adenopathy. There is aortic atherosclerosis. Bones are osteoporotic.
IMPRESSION: Patchy airspace opacity consistent with pneumonia right base.
Underlying emphysematous change. Mild cardiomegaly. Aortic
Atherosclerosis ([id number]).

## 2021-02-23 IMAGING — DX DG PORTABLE PELVIS
1 series · 1 of 1 positions shown · non-contrast
Comparison: None.

CLINICAL DATA: Pain following fall

EXAM:
PORTABLE PELVIS 1-2 VIEWS

[pelvis ap]
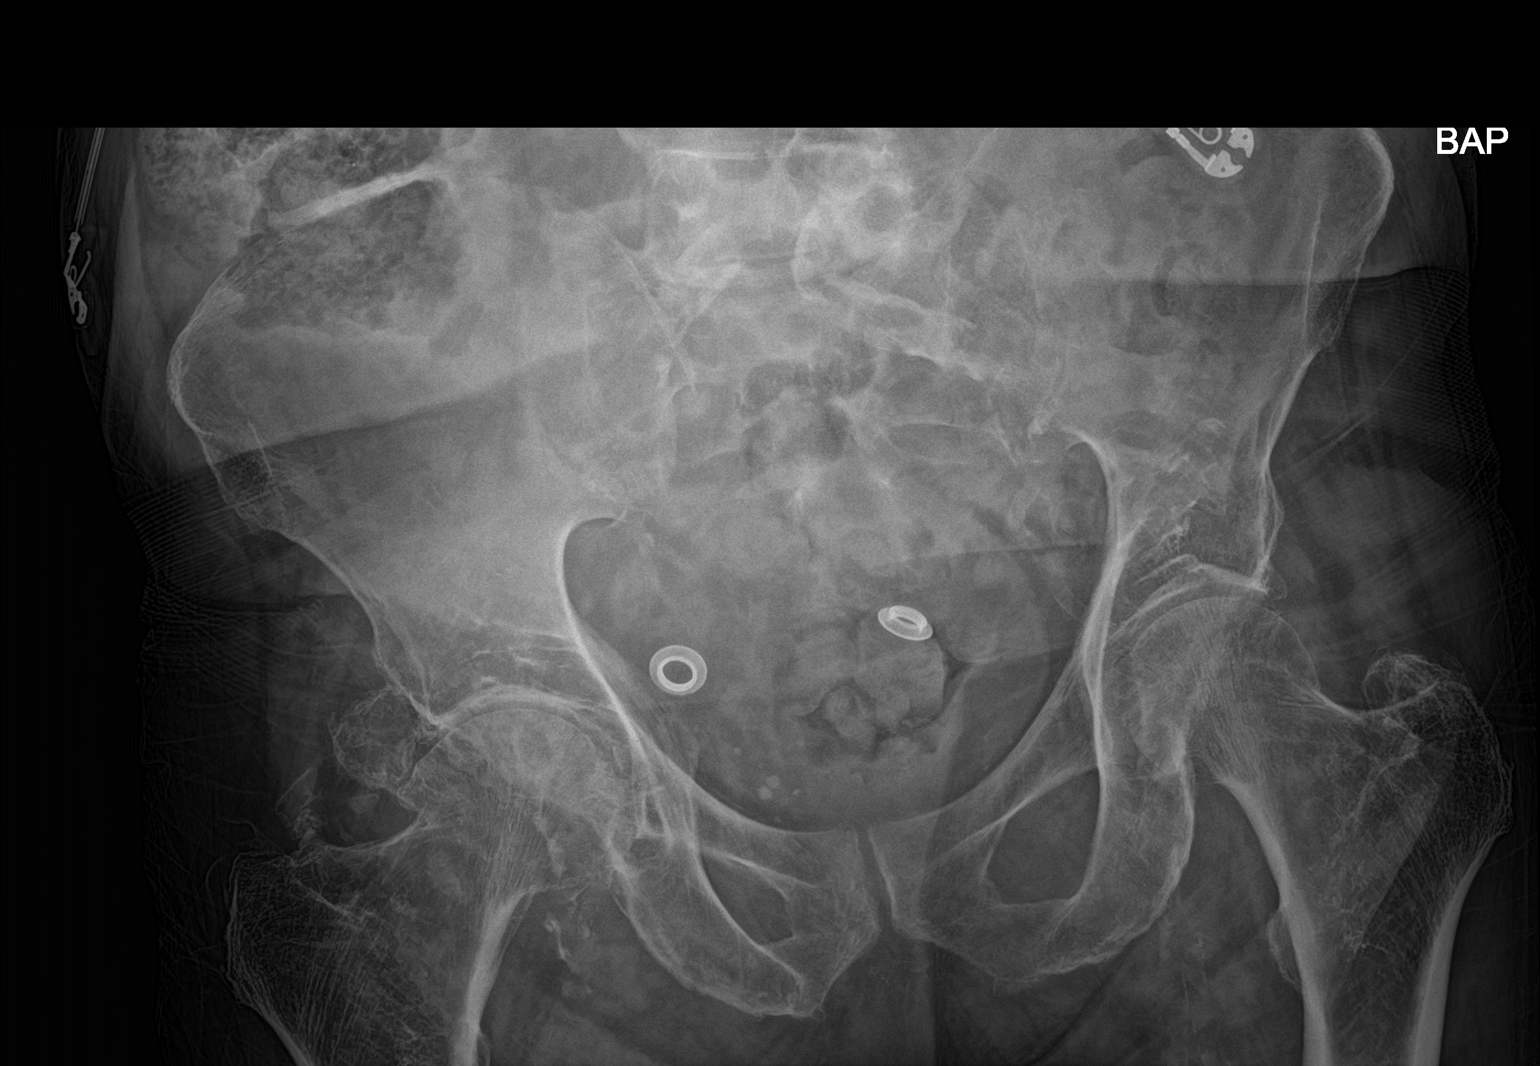

[1 of 1 positions shown; findings below may reference images not displayed]

FINDINGS: There is no evidence of acute pelvic fracture or dislocation. There
is evidence of prior avulsion along the greater trochanter with bony
fragments in the lateral aspect of the elbow joint, chronic in
appearance. There is moderate narrowing of the hip joints, slightly
more severe on the right than on the left. No erosive change. Bones
are osteoporotic. Multiple foci of vascular calcification noted in
the distal aorta as well as in iliac and femoral artery branches.
IMPRESSION: Prior avulsion along the greater trochanter on the right with bony
fragments in the lateral aspect of the right elbow joint, chronic in
appearance. No acute appearing fracture or dislocation. Narrowing of
each hip joint noted, more severe on the right than on the left.

Bones osteoporotic. Multifocal arterial vascular calcification
noted.

## 2021-08-22 IMAGING — DX DG WRIST COMPLETE 3+V*R*
4 series · 4 of 4 positions shown · non-contrast
Comparison: None

CLINICAL DATA: RIGHT wrist pain after catching a fall in the
bathroom, constant pain with swelling, limited range of motion

EXAM:
RIGHT WRIST - COMPLETE 3+ VIEW

[wrist pa]
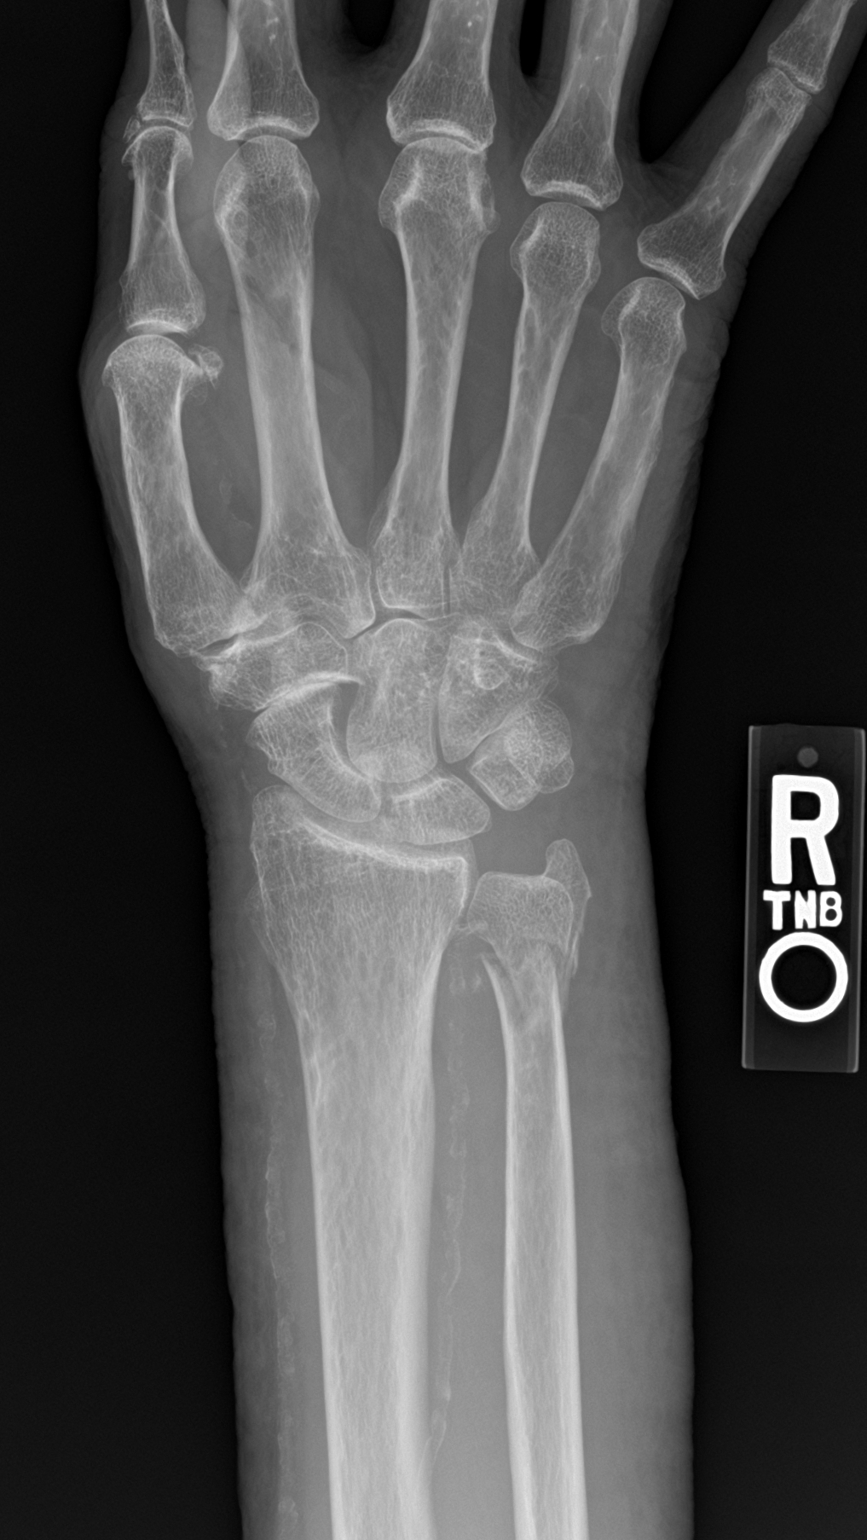

[wrist navicular]
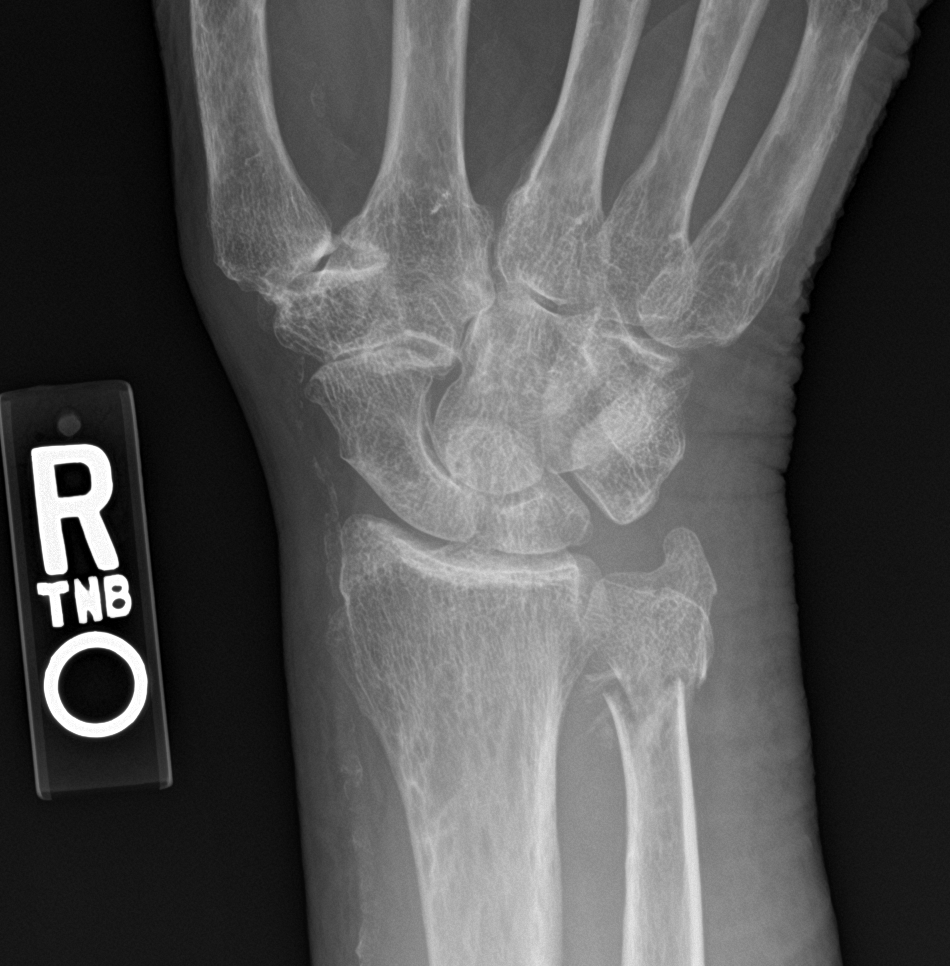

[wrist obl]
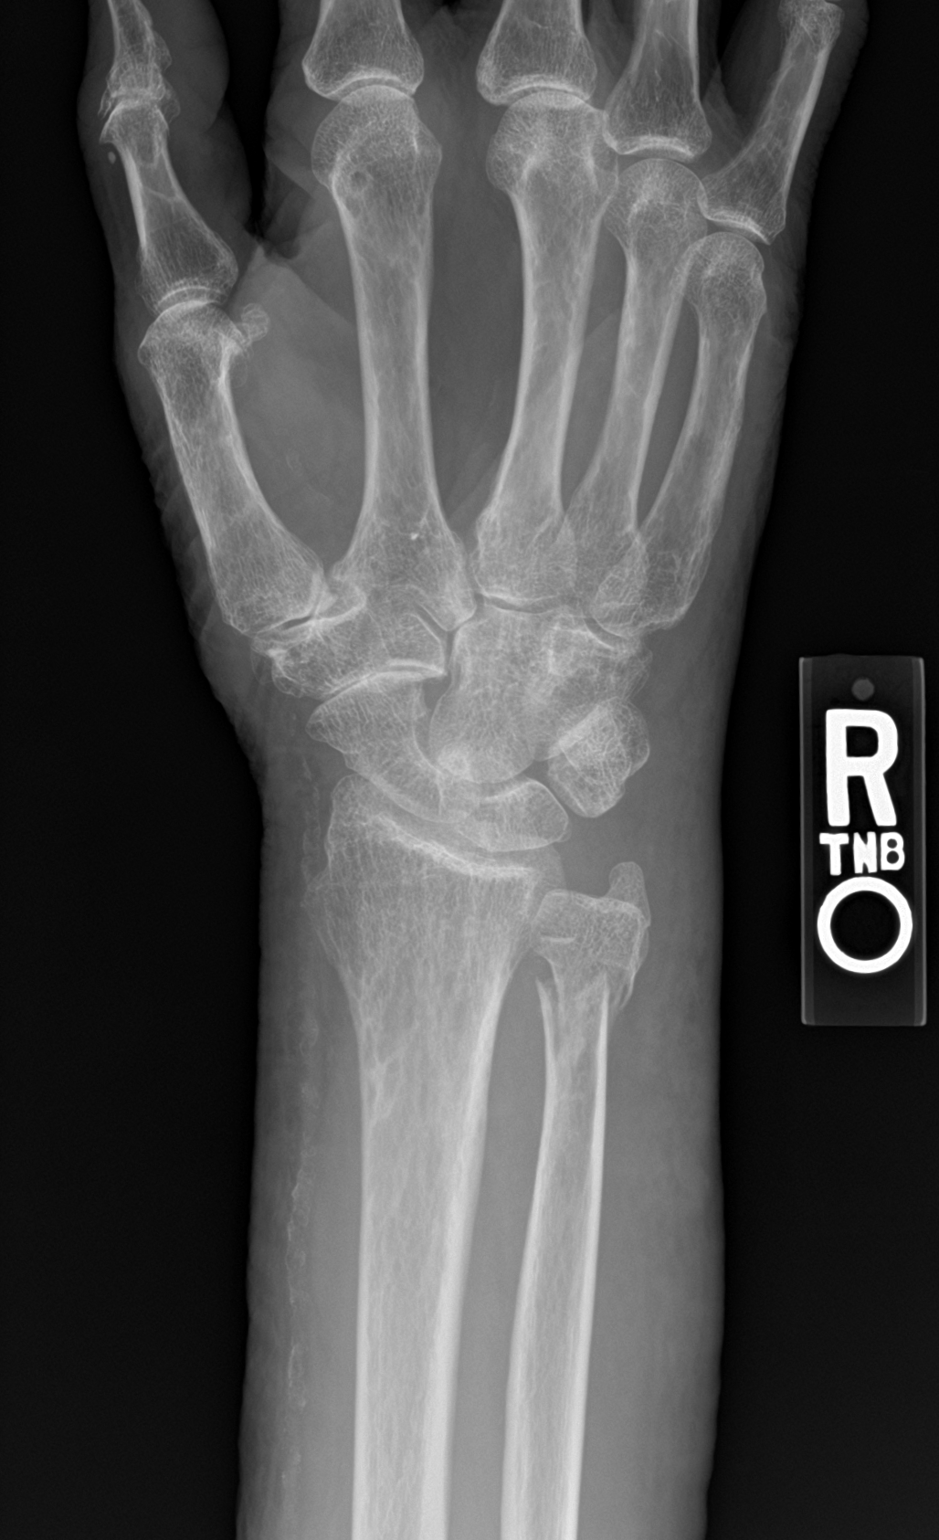

[wrist lat]
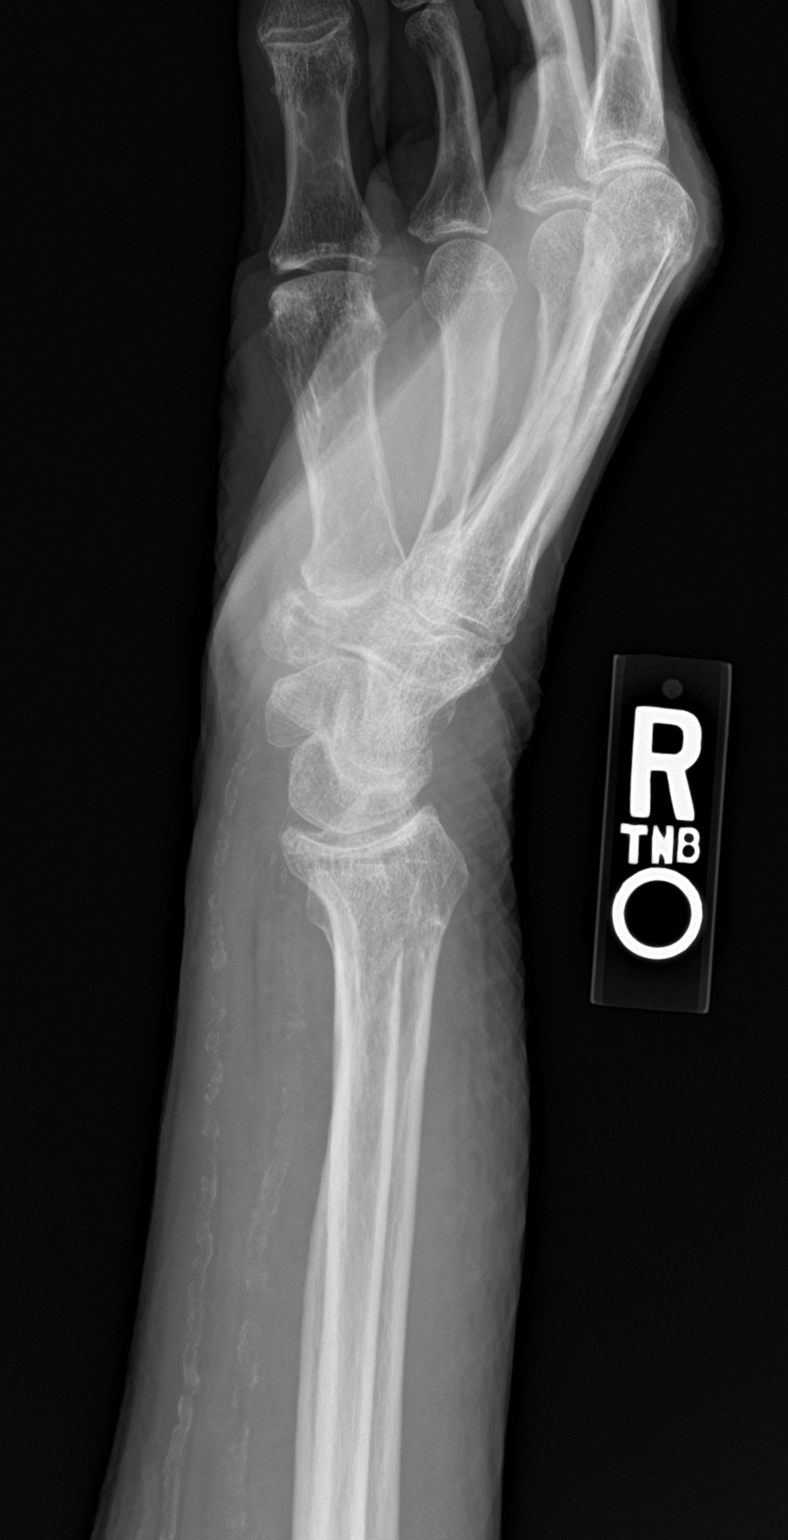

[4 of 4 positions shown; findings below may reference images not displayed]

FINDINGS: Osseous demineralization.

Joint space narrowing at STT joint and third MCP joint.

Displaced fracture of the distal RIGHT ulnar metadiaphysis.

No additional fracture, dislocation, or bone destruction.

Small vessel vascular calcifications.
IMPRESSION: Displaced RIGHT ulnar metadiaphyseal fracture.

Osseous demineralization with scattered degenerative changes.

## 2021-08-23 IMAGING — DX DG CHEST 1V PORT
1 series · 1 of 1 positions shown · non-contrast
Comparison: 10/05/2019

CLINICAL DATA: Sepsis, hypoxia

EXAM:
PORTABLE CHEST 1 VIEW

[chest ap]
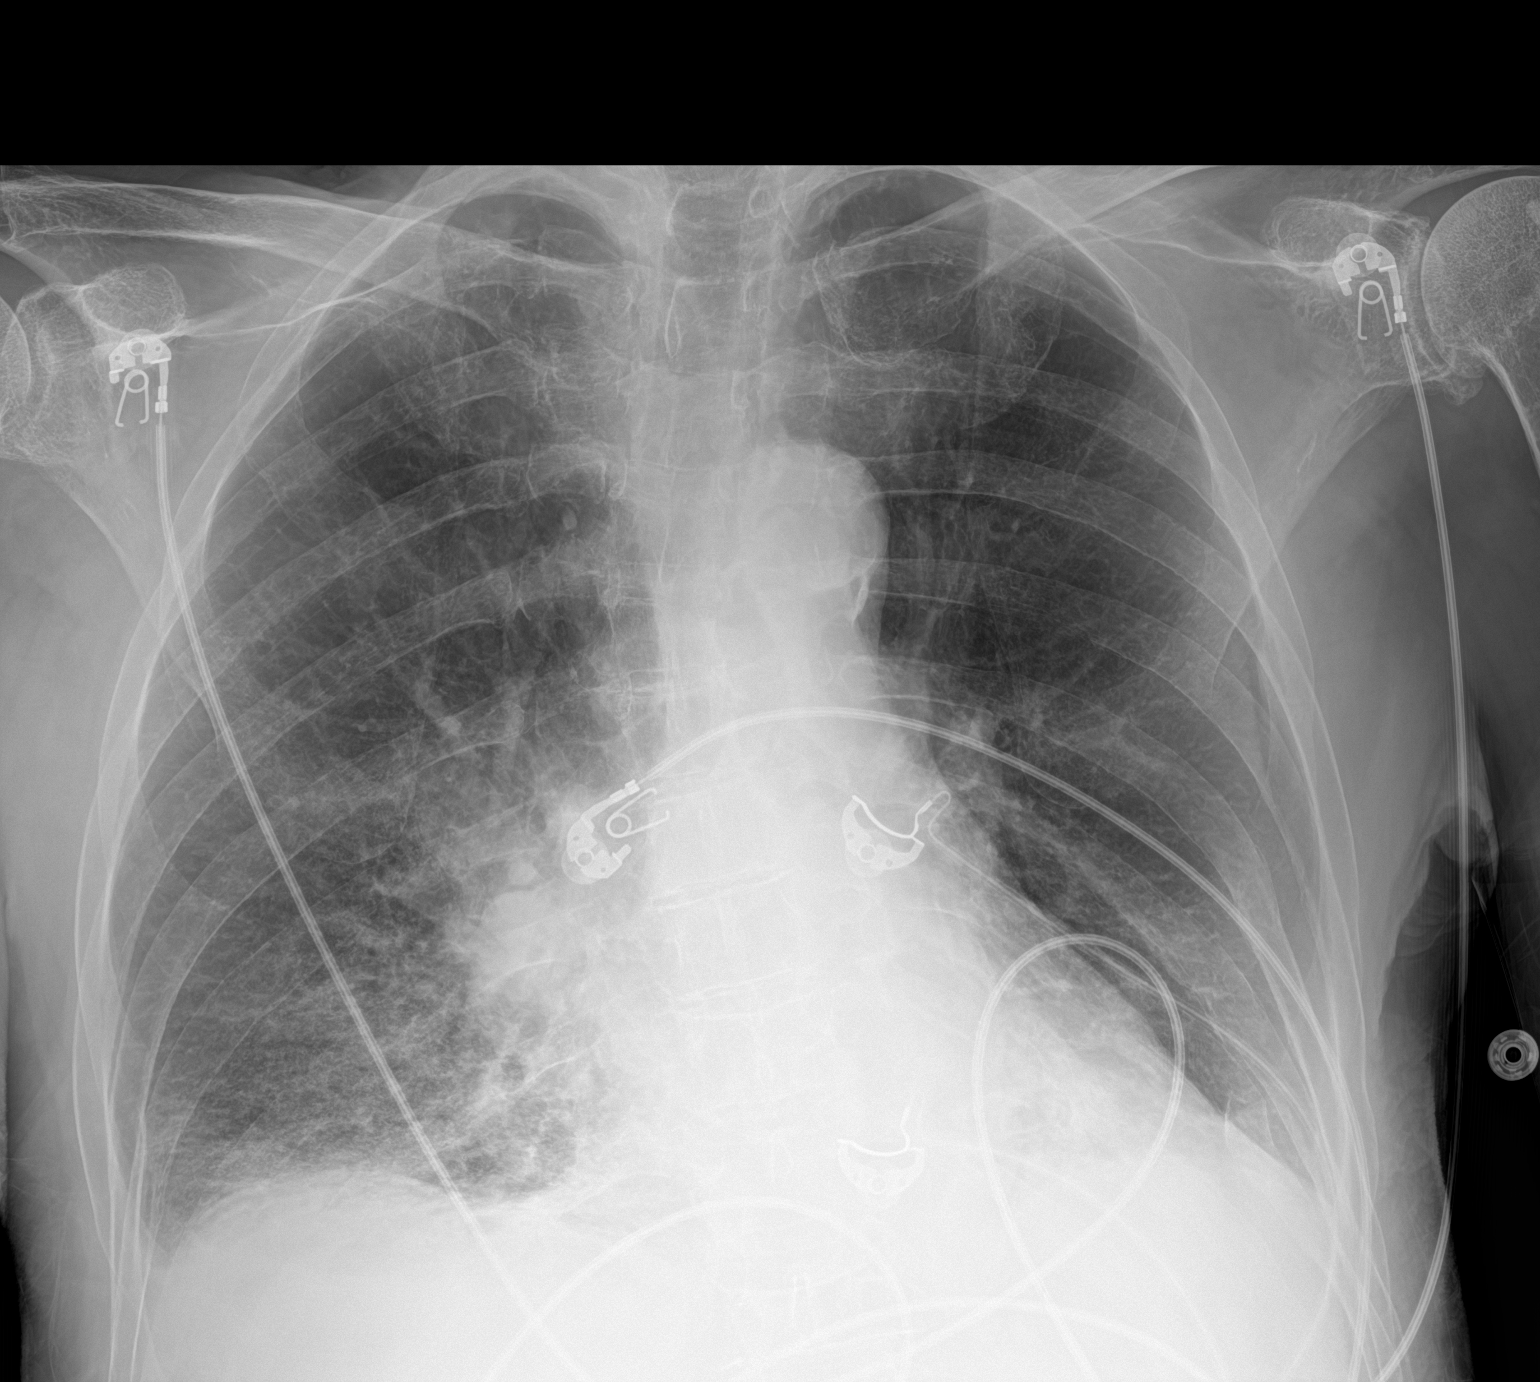

[1 of 1 positions shown; findings below may reference images not displayed]

FINDINGS: Progression of right lower lobe airspace disease which could be
pneumonia. Mild left lower lobe airspace disease unchanged.

COPD. Negative for heart failure. Probable small bilateral pleural
effusions.
IMPRESSION: Progression of right lower lobe airspace disease, probable pneumonia
